# Patient Record
Sex: Female | Born: 1940
Health system: Southern US, Community
[De-identification: ages and names within clinical notes are randomized; demographics above are authoritative.]

## PROBLEM LIST (undated history)

## (undated) DIAGNOSIS — Z87442 Personal history of urinary calculi: Secondary | ICD-10-CM

## (undated) DIAGNOSIS — E785 Hyperlipidemia, unspecified: Secondary | ICD-10-CM

## (undated) DIAGNOSIS — Z9889 Other specified postprocedural states: Secondary | ICD-10-CM

## (undated) DIAGNOSIS — R011 Cardiac murmur, unspecified: Secondary | ICD-10-CM

## (undated) DIAGNOSIS — F0390 Unspecified dementia without behavioral disturbance: Secondary | ICD-10-CM

## (undated) DIAGNOSIS — I10 Essential (primary) hypertension: Secondary | ICD-10-CM

## (undated) DIAGNOSIS — K760 Fatty (change of) liver, not elsewhere classified: Secondary | ICD-10-CM

## (undated) DIAGNOSIS — E079 Disorder of thyroid, unspecified: Secondary | ICD-10-CM

## (undated) DIAGNOSIS — I499 Cardiac arrhythmia, unspecified: Secondary | ICD-10-CM

## (undated) HISTORY — DX: Essential (primary) hypertension: I10

## (undated) HISTORY — PX: TOTAL KNEE ARTHROPLASTY: SHX125

## (undated) HISTORY — DX: Cardiac arrhythmia, unspecified: I49.9

## (undated) HISTORY — DX: Cardiac murmur, unspecified: R01.1

## (undated) HISTORY — DX: Disorder of thyroid, unspecified: E07.9

## (undated) HISTORY — PX: TOTAL ABDOMINAL HYSTERECTOMY: SHX209

## (undated) HISTORY — PX: BACK SURGERY: SHX140

## (undated) HISTORY — DX: Other specified postprocedural states: Z98.890

## (undated) HISTORY — DX: Personal history of urinary calculi: Z87.442

## (undated) HISTORY — DX: Hyperlipidemia, unspecified: E78.5

## (undated) HISTORY — PX: TOE SURGERY: SHX1073

## (undated) HISTORY — PX: OTHER SURGICAL HISTORY: SHX169

## (undated) HISTORY — PX: CHOLECYSTECTOMY: SHX55

---

## 1998-01-17 ENCOUNTER — Ambulatory Visit (HOSPITAL_COMMUNITY): Admission: RE | Admit: 1998-01-17 | Discharge: 1998-01-17 | Payer: Self-pay | Admitting: Neurosurgery

## 2004-10-11 ENCOUNTER — Ambulatory Visit: Payer: Self-pay | Admitting: Specialist

## 2004-10-17 ENCOUNTER — Ambulatory Visit: Payer: Self-pay | Admitting: Specialist

## 2005-01-15 ENCOUNTER — Encounter: Payer: Self-pay | Admitting: Orthopaedic Surgery

## 2005-01-18 ENCOUNTER — Observation Stay: Payer: Self-pay | Admitting: Internal Medicine

## 2005-01-20 ENCOUNTER — Encounter: Payer: Self-pay | Admitting: Orthopaedic Surgery

## 2005-02-20 ENCOUNTER — Encounter: Payer: Self-pay | Admitting: Orthopaedic Surgery

## 2005-11-22 ENCOUNTER — Ambulatory Visit: Payer: Self-pay | Admitting: Internal Medicine

## 2005-12-07 ENCOUNTER — Ambulatory Visit: Payer: Self-pay | Admitting: Internal Medicine

## 2006-02-16 ENCOUNTER — Other Ambulatory Visit: Payer: Self-pay

## 2006-02-16 ENCOUNTER — Emergency Department: Payer: Self-pay | Admitting: Emergency Medicine

## 2006-03-14 ENCOUNTER — Ambulatory Visit: Payer: Self-pay | Admitting: Gastroenterology

## 2007-05-20 ENCOUNTER — Ambulatory Visit: Payer: Self-pay | Admitting: Unknown Physician Specialty

## 2008-05-21 ENCOUNTER — Ambulatory Visit: Payer: Self-pay | Admitting: Internal Medicine

## 2009-01-31 DIAGNOSIS — C4491 Basal cell carcinoma of skin, unspecified: Secondary | ICD-10-CM

## 2009-01-31 HISTORY — DX: Basal cell carcinoma of skin, unspecified: C44.91

## 2009-06-28 ENCOUNTER — Ambulatory Visit: Payer: Self-pay | Admitting: Internal Medicine

## 2009-07-27 ENCOUNTER — Ambulatory Visit: Payer: Self-pay | Admitting: Unknown Physician Specialty

## 2009-07-29 ENCOUNTER — Ambulatory Visit: Payer: Self-pay | Admitting: Unknown Physician Specialty

## 2009-08-12 ENCOUNTER — Encounter: Payer: Self-pay | Admitting: Orthopedic Surgery

## 2009-08-23 ENCOUNTER — Encounter: Payer: Self-pay | Admitting: Orthopedic Surgery

## 2009-09-01 ENCOUNTER — Ambulatory Visit: Payer: Self-pay | Admitting: Neurology

## 2009-09-20 ENCOUNTER — Encounter: Payer: Self-pay | Admitting: Orthopedic Surgery

## 2009-09-21 ENCOUNTER — Ambulatory Visit: Payer: Self-pay | Admitting: Ophthalmology

## 2009-11-23 ENCOUNTER — Ambulatory Visit: Payer: Self-pay | Admitting: Ophthalmology

## 2009-12-12 ENCOUNTER — Ambulatory Visit: Payer: Self-pay | Admitting: Internal Medicine

## 2010-04-24 ENCOUNTER — Ambulatory Visit: Payer: Self-pay | Admitting: Internal Medicine

## 2010-09-25 ENCOUNTER — Ambulatory Visit: Payer: Self-pay | Admitting: Unknown Physician Specialty

## 2011-01-08 ENCOUNTER — Ambulatory Visit: Payer: Self-pay

## 2011-02-09 ENCOUNTER — Ambulatory Visit: Payer: Self-pay | Admitting: General Practice

## 2011-02-15 ENCOUNTER — Ambulatory Visit: Payer: Self-pay | Admitting: Internal Medicine

## 2011-02-28 ENCOUNTER — Ambulatory Visit: Payer: Self-pay | Admitting: General Practice

## 2011-08-08 DIAGNOSIS — M1710 Unilateral primary osteoarthritis, unspecified knee: Secondary | ICD-10-CM | POA: Diagnosis not present

## 2011-08-08 DIAGNOSIS — M25569 Pain in unspecified knee: Secondary | ICD-10-CM | POA: Diagnosis not present

## 2011-08-16 DIAGNOSIS — B029 Zoster without complications: Secondary | ICD-10-CM | POA: Diagnosis not present

## 2011-08-23 DIAGNOSIS — J209 Acute bronchitis, unspecified: Secondary | ICD-10-CM | POA: Diagnosis not present

## 2011-08-23 DIAGNOSIS — B029 Zoster without complications: Secondary | ICD-10-CM | POA: Diagnosis not present

## 2011-08-27 ENCOUNTER — Ambulatory Visit: Payer: Self-pay | Admitting: Internal Medicine

## 2011-08-27 DIAGNOSIS — R0602 Shortness of breath: Secondary | ICD-10-CM | POA: Diagnosis not present

## 2011-08-27 DIAGNOSIS — R062 Wheezing: Secondary | ICD-10-CM | POA: Diagnosis not present

## 2011-08-29 ENCOUNTER — Ambulatory Visit: Payer: Self-pay | Admitting: Internal Medicine

## 2011-08-29 DIAGNOSIS — R0602 Shortness of breath: Secondary | ICD-10-CM | POA: Diagnosis not present

## 2011-08-29 DIAGNOSIS — J40 Bronchitis, not specified as acute or chronic: Secondary | ICD-10-CM | POA: Diagnosis not present

## 2011-08-29 DIAGNOSIS — R062 Wheezing: Secondary | ICD-10-CM | POA: Diagnosis not present

## 2011-08-29 LAB — CBC WITH DIFFERENTIAL/PLATELET
Basophil #: 0.1 10*3/uL (ref 0.0–0.1)
Basophil %: 1 %
Eosinophil #: 0.2 10*3/uL (ref 0.0–0.7)
Eosinophil %: 3.1 %
HCT: 38.2 % (ref 35.0–47.0)
HGB: 12.7 g/dL (ref 12.0–16.0)
Lymphocyte #: 1.9 10*3/uL (ref 1.0–3.6)
Lymphocyte %: 32.7 %
MCH: 30.9 pg (ref 26.0–34.0)
MCHC: 33.3 g/dL (ref 32.0–36.0)
MCV: 93 fL (ref 80–100)
Monocyte #: 0.2 10*3/uL (ref 0.0–0.7)
Monocyte %: 3.4 %
Neutrophil #: 3.4 10*3/uL (ref 1.4–6.5)
Neutrophil %: 59.8 %
Platelet: 179 10*3/uL (ref 150–440)
RBC: 4.12 10*6/uL (ref 3.80–5.20)
RDW: 13.9 % (ref 11.5–14.5)
WBC: 5.7 10*3/uL (ref 3.6–11.0)

## 2011-08-29 LAB — COMPREHENSIVE METABOLIC PANEL
Albumin: 4.1 g/dL (ref 3.4–5.0)
Alkaline Phosphatase: 60 U/L (ref 50–136)
Anion Gap: 7 (ref 7–16)
BUN: 15 mg/dL (ref 7–18)
Bilirubin,Total: 0.9 mg/dL (ref 0.2–1.0)
Calcium, Total: 9.3 mg/dL (ref 8.5–10.1)
Chloride: 106 mmol/L (ref 98–107)
Co2: 26 mmol/L (ref 21–32)
Creatinine: 1.14 mg/dL (ref 0.60–1.30)
EGFR (African American): >60
EGFR (Non-African Amer.): 50 — ABNORMAL LOW
Glucose: 108 mg/dL — ABNORMAL HIGH (ref 65–99)
Osmolality: 279 (ref 275–301)
Potassium: 4.8 mmol/L (ref 3.5–5.1)
SGOT(AST): 170 U/L — ABNORMAL HIGH (ref 15–37)
SGPT (ALT): 122 U/L — ABNORMAL HIGH
Sodium: 139 mmol/L (ref 136–145)
Total Protein: 8.1 g/dL (ref 6.4–8.2)

## 2011-09-06 DIAGNOSIS — M1710 Unilateral primary osteoarthritis, unspecified knee: Secondary | ICD-10-CM | POA: Diagnosis not present

## 2011-09-06 DIAGNOSIS — Z01810 Encounter for preprocedural cardiovascular examination: Secondary | ICD-10-CM | POA: Diagnosis not present

## 2011-09-06 DIAGNOSIS — Z01818 Encounter for other preprocedural examination: Secondary | ICD-10-CM | POA: Diagnosis not present

## 2011-09-17 DIAGNOSIS — K219 Gastro-esophageal reflux disease without esophagitis: Secondary | ICD-10-CM | POA: Diagnosis not present

## 2011-09-17 DIAGNOSIS — Z7982 Long term (current) use of aspirin: Secondary | ICD-10-CM | POA: Diagnosis not present

## 2011-09-17 DIAGNOSIS — E039 Hypothyroidism, unspecified: Secondary | ICD-10-CM | POA: Diagnosis not present

## 2011-09-17 DIAGNOSIS — M1710 Unilateral primary osteoarthritis, unspecified knee: Secondary | ICD-10-CM | POA: Diagnosis not present

## 2011-09-17 DIAGNOSIS — I10 Essential (primary) hypertension: Secondary | ICD-10-CM | POA: Diagnosis not present

## 2011-09-17 DIAGNOSIS — Z79899 Other long term (current) drug therapy: Secondary | ICD-10-CM | POA: Diagnosis not present

## 2011-09-18 DIAGNOSIS — I10 Essential (primary) hypertension: Secondary | ICD-10-CM | POA: Diagnosis not present

## 2011-09-18 DIAGNOSIS — E039 Hypothyroidism, unspecified: Secondary | ICD-10-CM | POA: Diagnosis not present

## 2011-09-18 DIAGNOSIS — Z79899 Other long term (current) drug therapy: Secondary | ICD-10-CM | POA: Diagnosis not present

## 2011-09-18 DIAGNOSIS — M1710 Unilateral primary osteoarthritis, unspecified knee: Secondary | ICD-10-CM | POA: Diagnosis not present

## 2011-09-18 DIAGNOSIS — Z7982 Long term (current) use of aspirin: Secondary | ICD-10-CM | POA: Diagnosis not present

## 2011-09-18 DIAGNOSIS — K219 Gastro-esophageal reflux disease without esophagitis: Secondary | ICD-10-CM | POA: Diagnosis not present

## 2011-09-19 DIAGNOSIS — Z7982 Long term (current) use of aspirin: Secondary | ICD-10-CM | POA: Diagnosis not present

## 2011-09-19 DIAGNOSIS — Z79899 Other long term (current) drug therapy: Secondary | ICD-10-CM | POA: Diagnosis not present

## 2011-09-19 DIAGNOSIS — I10 Essential (primary) hypertension: Secondary | ICD-10-CM | POA: Diagnosis not present

## 2011-09-19 DIAGNOSIS — E039 Hypothyroidism, unspecified: Secondary | ICD-10-CM | POA: Diagnosis not present

## 2011-09-19 DIAGNOSIS — M1710 Unilateral primary osteoarthritis, unspecified knee: Secondary | ICD-10-CM | POA: Diagnosis not present

## 2011-09-19 DIAGNOSIS — K219 Gastro-esophageal reflux disease without esophagitis: Secondary | ICD-10-CM | POA: Diagnosis not present

## 2011-09-25 ENCOUNTER — Encounter: Payer: Self-pay | Admitting: Orthopedic Surgery

## 2011-09-25 DIAGNOSIS — M6281 Muscle weakness (generalized): Secondary | ICD-10-CM | POA: Diagnosis not present

## 2011-09-25 DIAGNOSIS — R262 Difficulty in walking, not elsewhere classified: Secondary | ICD-10-CM | POA: Diagnosis not present

## 2011-09-25 DIAGNOSIS — Z96659 Presence of unspecified artificial knee joint: Secondary | ICD-10-CM | POA: Diagnosis not present

## 2011-09-25 DIAGNOSIS — IMO0001 Reserved for inherently not codable concepts without codable children: Secondary | ICD-10-CM | POA: Diagnosis not present

## 2011-10-03 DIAGNOSIS — I10 Essential (primary) hypertension: Secondary | ICD-10-CM | POA: Diagnosis not present

## 2011-10-03 DIAGNOSIS — L02419 Cutaneous abscess of limb, unspecified: Secondary | ICD-10-CM | POA: Diagnosis not present

## 2011-10-03 DIAGNOSIS — E039 Hypothyroidism, unspecified: Secondary | ICD-10-CM | POA: Diagnosis not present

## 2011-10-03 DIAGNOSIS — T8450XA Infection and inflammatory reaction due to unspecified internal joint prosthesis, initial encounter: Secondary | ICD-10-CM | POA: Diagnosis not present

## 2011-10-03 DIAGNOSIS — M549 Dorsalgia, unspecified: Secondary | ICD-10-CM | POA: Diagnosis not present

## 2011-10-03 DIAGNOSIS — G8929 Other chronic pain: Secondary | ICD-10-CM | POA: Diagnosis not present

## 2011-10-03 DIAGNOSIS — F329 Major depressive disorder, single episode, unspecified: Secondary | ICD-10-CM | POA: Diagnosis not present

## 2011-10-03 DIAGNOSIS — L03119 Cellulitis of unspecified part of limb: Secondary | ICD-10-CM | POA: Diagnosis not present

## 2011-10-03 DIAGNOSIS — Z96659 Presence of unspecified artificial knee joint: Secondary | ICD-10-CM | POA: Diagnosis not present

## 2011-10-03 DIAGNOSIS — E785 Hyperlipidemia, unspecified: Secondary | ICD-10-CM | POA: Diagnosis not present

## 2011-10-03 DIAGNOSIS — M009 Pyogenic arthritis, unspecified: Secondary | ICD-10-CM | POA: Diagnosis not present

## 2011-10-04 DIAGNOSIS — K219 Gastro-esophageal reflux disease without esophagitis: Secondary | ICD-10-CM | POA: Diagnosis present

## 2011-10-04 DIAGNOSIS — J984 Other disorders of lung: Secondary | ICD-10-CM | POA: Diagnosis present

## 2011-10-04 DIAGNOSIS — E039 Hypothyroidism, unspecified: Secondary | ICD-10-CM | POA: Diagnosis present

## 2011-10-04 DIAGNOSIS — I10 Essential (primary) hypertension: Secondary | ICD-10-CM | POA: Diagnosis present

## 2011-10-04 DIAGNOSIS — Z792 Long term (current) use of antibiotics: Secondary | ICD-10-CM | POA: Diagnosis not present

## 2011-10-04 DIAGNOSIS — Y831 Surgical operation with implant of artificial internal device as the cause of abnormal reaction of the patient, or of later complication, without mention of misadventure at the time of the procedure: Secondary | ICD-10-CM | POA: Diagnosis not present

## 2011-10-04 DIAGNOSIS — Z452 Encounter for adjustment and management of vascular access device: Secondary | ICD-10-CM | POA: Diagnosis not present

## 2011-10-04 DIAGNOSIS — E785 Hyperlipidemia, unspecified: Secondary | ICD-10-CM | POA: Diagnosis present

## 2011-10-04 DIAGNOSIS — Z6836 Body mass index (BMI) 36.0-36.9, adult: Secondary | ICD-10-CM | POA: Diagnosis not present

## 2011-10-04 DIAGNOSIS — R339 Retention of urine, unspecified: Secondary | ICD-10-CM | POA: Diagnosis not present

## 2011-10-04 DIAGNOSIS — T8450XA Infection and inflammatory reaction due to unspecified internal joint prosthesis, initial encounter: Secondary | ICD-10-CM | POA: Diagnosis not present

## 2011-10-04 DIAGNOSIS — E669 Obesity, unspecified: Secondary | ICD-10-CM | POA: Diagnosis present

## 2011-10-04 DIAGNOSIS — K7689 Other specified diseases of liver: Secondary | ICD-10-CM | POA: Diagnosis present

## 2011-10-04 DIAGNOSIS — B954 Other streptococcus as the cause of diseases classified elsewhere: Secondary | ICD-10-CM | POA: Diagnosis present

## 2011-10-04 DIAGNOSIS — Z96659 Presence of unspecified artificial knee joint: Secondary | ICD-10-CM | POA: Diagnosis not present

## 2011-10-09 DIAGNOSIS — Z96659 Presence of unspecified artificial knee joint: Secondary | ICD-10-CM | POA: Diagnosis not present

## 2011-10-09 DIAGNOSIS — I10 Essential (primary) hypertension: Secondary | ICD-10-CM | POA: Diagnosis not present

## 2011-10-09 DIAGNOSIS — E039 Hypothyroidism, unspecified: Secondary | ICD-10-CM | POA: Diagnosis not present

## 2011-10-09 DIAGNOSIS — Z452 Encounter for adjustment and management of vascular access device: Secondary | ICD-10-CM | POA: Diagnosis not present

## 2011-10-09 DIAGNOSIS — T8450XA Infection and inflammatory reaction due to unspecified internal joint prosthesis, initial encounter: Secondary | ICD-10-CM | POA: Diagnosis not present

## 2011-10-09 DIAGNOSIS — Z792 Long term (current) use of antibiotics: Secondary | ICD-10-CM | POA: Diagnosis not present

## 2011-10-12 DIAGNOSIS — M1710 Unilateral primary osteoarthritis, unspecified knee: Secondary | ICD-10-CM | POA: Diagnosis not present

## 2011-10-12 DIAGNOSIS — I10 Essential (primary) hypertension: Secondary | ICD-10-CM | POA: Diagnosis not present

## 2011-10-12 DIAGNOSIS — E039 Hypothyroidism, unspecified: Secondary | ICD-10-CM | POA: Diagnosis not present

## 2011-10-12 DIAGNOSIS — Z792 Long term (current) use of antibiotics: Secondary | ICD-10-CM | POA: Diagnosis not present

## 2011-10-12 DIAGNOSIS — Z452 Encounter for adjustment and management of vascular access device: Secondary | ICD-10-CM | POA: Diagnosis not present

## 2011-10-12 DIAGNOSIS — Z96659 Presence of unspecified artificial knee joint: Secondary | ICD-10-CM | POA: Diagnosis not present

## 2011-10-12 DIAGNOSIS — T8450XA Infection and inflammatory reaction due to unspecified internal joint prosthesis, initial encounter: Secondary | ICD-10-CM | POA: Diagnosis not present

## 2011-10-16 DIAGNOSIS — T8450XA Infection and inflammatory reaction due to unspecified internal joint prosthesis, initial encounter: Secondary | ICD-10-CM | POA: Diagnosis not present

## 2011-10-16 DIAGNOSIS — Z5189 Encounter for other specified aftercare: Secondary | ICD-10-CM | POA: Diagnosis not present

## 2011-10-16 DIAGNOSIS — I10 Essential (primary) hypertension: Secondary | ICD-10-CM | POA: Diagnosis not present

## 2011-10-16 DIAGNOSIS — IMO0002 Reserved for concepts with insufficient information to code with codable children: Secondary | ICD-10-CM | POA: Diagnosis not present

## 2011-10-16 DIAGNOSIS — Z96659 Presence of unspecified artificial knee joint: Secondary | ICD-10-CM | POA: Diagnosis not present

## 2011-10-16 DIAGNOSIS — E039 Hypothyroidism, unspecified: Secondary | ICD-10-CM | POA: Diagnosis not present

## 2011-10-16 DIAGNOSIS — Z452 Encounter for adjustment and management of vascular access device: Secondary | ICD-10-CM | POA: Diagnosis not present

## 2011-10-16 DIAGNOSIS — Z792 Long term (current) use of antibiotics: Secondary | ICD-10-CM | POA: Diagnosis not present

## 2011-10-17 DIAGNOSIS — Z452 Encounter for adjustment and management of vascular access device: Secondary | ICD-10-CM | POA: Diagnosis not present

## 2011-10-17 DIAGNOSIS — E039 Hypothyroidism, unspecified: Secondary | ICD-10-CM | POA: Diagnosis not present

## 2011-10-17 DIAGNOSIS — Z96659 Presence of unspecified artificial knee joint: Secondary | ICD-10-CM | POA: Diagnosis not present

## 2011-10-17 DIAGNOSIS — T8450XA Infection and inflammatory reaction due to unspecified internal joint prosthesis, initial encounter: Secondary | ICD-10-CM | POA: Diagnosis not present

## 2011-10-17 DIAGNOSIS — I10 Essential (primary) hypertension: Secondary | ICD-10-CM | POA: Diagnosis not present

## 2011-10-17 DIAGNOSIS — Z792 Long term (current) use of antibiotics: Secondary | ICD-10-CM | POA: Diagnosis not present

## 2011-10-22 ENCOUNTER — Encounter: Payer: Self-pay | Admitting: Orthopedic Surgery

## 2011-10-23 DIAGNOSIS — Z96659 Presence of unspecified artificial knee joint: Secondary | ICD-10-CM | POA: Diagnosis not present

## 2011-10-23 DIAGNOSIS — T8450XA Infection and inflammatory reaction due to unspecified internal joint prosthesis, initial encounter: Secondary | ICD-10-CM | POA: Diagnosis not present

## 2011-10-23 DIAGNOSIS — E039 Hypothyroidism, unspecified: Secondary | ICD-10-CM | POA: Diagnosis not present

## 2011-10-23 DIAGNOSIS — Z452 Encounter for adjustment and management of vascular access device: Secondary | ICD-10-CM | POA: Diagnosis not present

## 2011-10-23 DIAGNOSIS — Z792 Long term (current) use of antibiotics: Secondary | ICD-10-CM | POA: Diagnosis not present

## 2011-10-23 DIAGNOSIS — I10 Essential (primary) hypertension: Secondary | ICD-10-CM | POA: Diagnosis not present

## 2011-10-24 DIAGNOSIS — B370 Candidal stomatitis: Secondary | ICD-10-CM | POA: Diagnosis not present

## 2011-10-24 DIAGNOSIS — B354 Tinea corporis: Secondary | ICD-10-CM | POA: Diagnosis not present

## 2011-10-26 DIAGNOSIS — Z792 Long term (current) use of antibiotics: Secondary | ICD-10-CM | POA: Diagnosis not present

## 2011-10-26 DIAGNOSIS — Z96659 Presence of unspecified artificial knee joint: Secondary | ICD-10-CM | POA: Diagnosis not present

## 2011-10-26 DIAGNOSIS — Z452 Encounter for adjustment and management of vascular access device: Secondary | ICD-10-CM | POA: Diagnosis not present

## 2011-10-26 DIAGNOSIS — T8450XA Infection and inflammatory reaction due to unspecified internal joint prosthesis, initial encounter: Secondary | ICD-10-CM | POA: Diagnosis not present

## 2011-10-26 DIAGNOSIS — I10 Essential (primary) hypertension: Secondary | ICD-10-CM | POA: Diagnosis not present

## 2011-10-26 DIAGNOSIS — E039 Hypothyroidism, unspecified: Secondary | ICD-10-CM | POA: Diagnosis not present

## 2011-10-29 DIAGNOSIS — A419 Sepsis, unspecified organism: Secondary | ICD-10-CM | POA: Diagnosis not present

## 2011-10-29 DIAGNOSIS — N179 Acute kidney failure, unspecified: Secondary | ICD-10-CM | POA: Diagnosis not present

## 2011-10-29 DIAGNOSIS — K219 Gastro-esophageal reflux disease without esophagitis: Secondary | ICD-10-CM | POA: Diagnosis present

## 2011-10-29 DIAGNOSIS — D696 Thrombocytopenia, unspecified: Secondary | ICD-10-CM | POA: Diagnosis not present

## 2011-10-29 DIAGNOSIS — I10 Essential (primary) hypertension: Secondary | ICD-10-CM | POA: Diagnosis present

## 2011-10-29 DIAGNOSIS — E669 Obesity, unspecified: Secondary | ICD-10-CM | POA: Diagnosis present

## 2011-10-29 DIAGNOSIS — I059 Rheumatic mitral valve disease, unspecified: Secondary | ICD-10-CM | POA: Diagnosis present

## 2011-10-29 DIAGNOSIS — G4730 Sleep apnea, unspecified: Secondary | ICD-10-CM | POA: Diagnosis present

## 2011-10-29 DIAGNOSIS — N008 Acute nephritic syndrome with other morphologic changes: Secondary | ICD-10-CM | POA: Diagnosis present

## 2011-10-29 DIAGNOSIS — R509 Fever, unspecified: Secondary | ICD-10-CM | POA: Diagnosis not present

## 2011-10-29 DIAGNOSIS — Z452 Encounter for adjustment and management of vascular access device: Secondary | ICD-10-CM | POA: Diagnosis not present

## 2011-10-29 DIAGNOSIS — E039 Hypothyroidism, unspecified: Secondary | ICD-10-CM | POA: Diagnosis present

## 2011-10-29 DIAGNOSIS — K7689 Other specified diseases of liver: Secondary | ICD-10-CM | POA: Diagnosis present

## 2011-10-29 DIAGNOSIS — R6510 Systemic inflammatory response syndrome (SIRS) of non-infectious origin without acute organ dysfunction: Secondary | ICD-10-CM | POA: Diagnosis not present

## 2011-10-29 DIAGNOSIS — R197 Diarrhea, unspecified: Secondary | ICD-10-CM | POA: Diagnosis not present

## 2011-10-29 DIAGNOSIS — T8450XA Infection and inflammatory reaction due to unspecified internal joint prosthesis, initial encounter: Secondary | ICD-10-CM | POA: Diagnosis not present

## 2011-10-29 DIAGNOSIS — D61818 Other pancytopenia: Secondary | ICD-10-CM | POA: Diagnosis present

## 2011-10-29 DIAGNOSIS — A088 Other specified intestinal infections: Secondary | ICD-10-CM | POA: Diagnosis present

## 2011-10-29 DIAGNOSIS — N170 Acute kidney failure with tubular necrosis: Secondary | ICD-10-CM | POA: Diagnosis not present

## 2011-10-29 DIAGNOSIS — Z9889 Other specified postprocedural states: Secondary | ICD-10-CM | POA: Diagnosis not present

## 2011-10-29 DIAGNOSIS — R110 Nausea: Secondary | ICD-10-CM | POA: Diagnosis not present

## 2011-10-29 DIAGNOSIS — Z96659 Presence of unspecified artificial knee joint: Secondary | ICD-10-CM | POA: Diagnosis not present

## 2011-10-29 DIAGNOSIS — R5381 Other malaise: Secondary | ICD-10-CM | POA: Diagnosis not present

## 2011-10-29 DIAGNOSIS — E785 Hyperlipidemia, unspecified: Secondary | ICD-10-CM | POA: Diagnosis present

## 2011-11-06 DIAGNOSIS — N19 Unspecified kidney failure: Secondary | ICD-10-CM | POA: Diagnosis not present

## 2011-11-06 DIAGNOSIS — T783XXA Angioneurotic edema, initial encounter: Secondary | ICD-10-CM | POA: Diagnosis not present

## 2011-11-22 DIAGNOSIS — Z5189 Encounter for other specified aftercare: Secondary | ICD-10-CM | POA: Diagnosis not present

## 2011-11-22 DIAGNOSIS — M25569 Pain in unspecified knee: Secondary | ICD-10-CM | POA: Diagnosis not present

## 2011-11-22 DIAGNOSIS — M1710 Unilateral primary osteoarthritis, unspecified knee: Secondary | ICD-10-CM | POA: Diagnosis not present

## 2011-12-14 DIAGNOSIS — Z5189 Encounter for other specified aftercare: Secondary | ICD-10-CM | POA: Diagnosis not present

## 2011-12-14 DIAGNOSIS — M25569 Pain in unspecified knee: Secondary | ICD-10-CM | POA: Diagnosis not present

## 2011-12-19 DIAGNOSIS — M25569 Pain in unspecified knee: Secondary | ICD-10-CM | POA: Diagnosis not present

## 2012-01-16 DIAGNOSIS — D62 Acute posthemorrhagic anemia: Secondary | ICD-10-CM | POA: Diagnosis not present

## 2012-01-16 DIAGNOSIS — I10 Essential (primary) hypertension: Secondary | ICD-10-CM | POA: Diagnosis not present

## 2012-01-16 DIAGNOSIS — K603 Anal fistula, unspecified: Secondary | ICD-10-CM | POA: Diagnosis not present

## 2012-01-17 DIAGNOSIS — I10 Essential (primary) hypertension: Secondary | ICD-10-CM | POA: Diagnosis not present

## 2012-01-17 DIAGNOSIS — D649 Anemia, unspecified: Secondary | ICD-10-CM | POA: Diagnosis not present

## 2012-01-17 DIAGNOSIS — E039 Hypothyroidism, unspecified: Secondary | ICD-10-CM | POA: Diagnosis not present

## 2012-01-17 DIAGNOSIS — E559 Vitamin D deficiency, unspecified: Secondary | ICD-10-CM | POA: Diagnosis not present

## 2012-01-17 DIAGNOSIS — E782 Mixed hyperlipidemia: Secondary | ICD-10-CM | POA: Diagnosis not present

## 2012-01-28 DIAGNOSIS — Z1231 Encounter for screening mammogram for malignant neoplasm of breast: Secondary | ICD-10-CM | POA: Diagnosis not present

## 2012-01-30 DIAGNOSIS — K7460 Unspecified cirrhosis of liver: Secondary | ICD-10-CM | POA: Diagnosis not present

## 2012-02-21 DIAGNOSIS — K602 Anal fissure, unspecified: Secondary | ICD-10-CM | POA: Diagnosis not present

## 2012-03-04 DIAGNOSIS — R0602 Shortness of breath: Secondary | ICD-10-CM | POA: Diagnosis not present

## 2012-03-04 DIAGNOSIS — I10 Essential (primary) hypertension: Secondary | ICD-10-CM | POA: Diagnosis not present

## 2012-03-04 DIAGNOSIS — F411 Generalized anxiety disorder: Secondary | ICD-10-CM | POA: Diagnosis not present

## 2012-04-02 DIAGNOSIS — K602 Anal fissure, unspecified: Secondary | ICD-10-CM | POA: Diagnosis not present

## 2012-04-21 DIAGNOSIS — Z961 Presence of intraocular lens: Secondary | ICD-10-CM | POA: Diagnosis not present

## 2012-04-21 DIAGNOSIS — H2510 Age-related nuclear cataract, unspecified eye: Secondary | ICD-10-CM | POA: Diagnosis not present

## 2012-04-22 DIAGNOSIS — I10 Essential (primary) hypertension: Secondary | ICD-10-CM | POA: Diagnosis not present

## 2012-04-22 DIAGNOSIS — E119 Type 2 diabetes mellitus without complications: Secondary | ICD-10-CM | POA: Diagnosis not present

## 2012-04-22 DIAGNOSIS — E039 Hypothyroidism, unspecified: Secondary | ICD-10-CM | POA: Diagnosis not present

## 2012-04-25 DIAGNOSIS — K7460 Unspecified cirrhosis of liver: Secondary | ICD-10-CM | POA: Diagnosis not present

## 2012-04-25 DIAGNOSIS — Z79899 Other long term (current) drug therapy: Secondary | ICD-10-CM | POA: Diagnosis not present

## 2012-06-05 DIAGNOSIS — M1710 Unilateral primary osteoarthritis, unspecified knee: Secondary | ICD-10-CM | POA: Diagnosis not present

## 2012-06-05 DIAGNOSIS — M25569 Pain in unspecified knee: Secondary | ICD-10-CM | POA: Diagnosis not present

## 2012-07-25 DIAGNOSIS — I10 Essential (primary) hypertension: Secondary | ICD-10-CM | POA: Diagnosis not present

## 2012-07-25 DIAGNOSIS — Z23 Encounter for immunization: Secondary | ICD-10-CM | POA: Diagnosis not present

## 2012-08-15 DIAGNOSIS — J3489 Other specified disorders of nose and nasal sinuses: Secondary | ICD-10-CM | POA: Diagnosis not present

## 2012-08-15 DIAGNOSIS — M542 Cervicalgia: Secondary | ICD-10-CM | POA: Diagnosis not present

## 2012-08-15 DIAGNOSIS — R0982 Postnasal drip: Secondary | ICD-10-CM | POA: Diagnosis not present

## 2012-08-26 DIAGNOSIS — M25579 Pain in unspecified ankle and joints of unspecified foot: Secondary | ICD-10-CM | POA: Diagnosis not present

## 2012-08-26 DIAGNOSIS — M2020 Hallux rigidus, unspecified foot: Secondary | ICD-10-CM | POA: Diagnosis not present

## 2012-10-20 DIAGNOSIS — R7309 Other abnormal glucose: Secondary | ICD-10-CM | POA: Diagnosis not present

## 2012-10-20 DIAGNOSIS — I10 Essential (primary) hypertension: Secondary | ICD-10-CM | POA: Diagnosis not present

## 2012-10-20 DIAGNOSIS — E039 Hypothyroidism, unspecified: Secondary | ICD-10-CM | POA: Diagnosis not present

## 2012-10-20 DIAGNOSIS — C049 Malignant neoplasm of floor of mouth, unspecified: Secondary | ICD-10-CM | POA: Diagnosis not present

## 2012-10-22 DIAGNOSIS — G609 Hereditary and idiopathic neuropathy, unspecified: Secondary | ICD-10-CM | POA: Diagnosis not present

## 2012-10-22 DIAGNOSIS — K7689 Other specified diseases of liver: Secondary | ICD-10-CM | POA: Diagnosis not present

## 2012-10-22 DIAGNOSIS — K7460 Unspecified cirrhosis of liver: Secondary | ICD-10-CM | POA: Diagnosis not present

## 2012-10-22 DIAGNOSIS — G629 Polyneuropathy, unspecified: Secondary | ICD-10-CM | POA: Insufficient documentation

## 2012-10-27 DIAGNOSIS — G609 Hereditary and idiopathic neuropathy, unspecified: Secondary | ICD-10-CM | POA: Diagnosis not present

## 2012-10-27 DIAGNOSIS — Z0389 Encounter for observation for other suspected diseases and conditions ruled out: Secondary | ICD-10-CM | POA: Diagnosis not present

## 2012-10-27 DIAGNOSIS — K7689 Other specified diseases of liver: Secondary | ICD-10-CM | POA: Diagnosis not present

## 2012-10-27 DIAGNOSIS — K838 Other specified diseases of biliary tract: Secondary | ICD-10-CM | POA: Diagnosis not present

## 2012-10-27 DIAGNOSIS — K7460 Unspecified cirrhosis of liver: Secondary | ICD-10-CM | POA: Diagnosis not present

## 2012-11-26 DIAGNOSIS — I10 Essential (primary) hypertension: Secondary | ICD-10-CM | POA: Diagnosis not present

## 2013-01-09 DIAGNOSIS — I10 Essential (primary) hypertension: Secondary | ICD-10-CM | POA: Diagnosis not present

## 2013-01-09 DIAGNOSIS — F411 Generalized anxiety disorder: Secondary | ICD-10-CM | POA: Diagnosis not present

## 2013-02-09 DIAGNOSIS — Z85828 Personal history of other malignant neoplasm of skin: Secondary | ICD-10-CM | POA: Diagnosis not present

## 2013-02-09 DIAGNOSIS — L570 Actinic keratosis: Secondary | ICD-10-CM | POA: Diagnosis not present

## 2013-02-09 DIAGNOSIS — L820 Inflamed seborrheic keratosis: Secondary | ICD-10-CM | POA: Diagnosis not present

## 2013-02-09 DIAGNOSIS — L909 Atrophic disorder of skin, unspecified: Secondary | ICD-10-CM | POA: Diagnosis not present

## 2013-02-09 DIAGNOSIS — L821 Other seborrheic keratosis: Secondary | ICD-10-CM | POA: Diagnosis not present

## 2013-02-09 DIAGNOSIS — L919 Hypertrophic disorder of the skin, unspecified: Secondary | ICD-10-CM | POA: Diagnosis not present

## 2013-02-09 DIAGNOSIS — D2330 Other benign neoplasm of skin of unspecified part of face: Secondary | ICD-10-CM | POA: Diagnosis not present

## 2013-02-09 DIAGNOSIS — D485 Neoplasm of uncertain behavior of skin: Secondary | ICD-10-CM | POA: Diagnosis not present

## 2013-02-16 DIAGNOSIS — Z1231 Encounter for screening mammogram for malignant neoplasm of breast: Secondary | ICD-10-CM | POA: Diagnosis not present

## 2013-04-13 DIAGNOSIS — F411 Generalized anxiety disorder: Secondary | ICD-10-CM | POA: Diagnosis not present

## 2013-04-13 DIAGNOSIS — Z23 Encounter for immunization: Secondary | ICD-10-CM | POA: Diagnosis not present

## 2013-04-13 DIAGNOSIS — I10 Essential (primary) hypertension: Secondary | ICD-10-CM | POA: Diagnosis not present

## 2013-04-28 DIAGNOSIS — IMO0002 Reserved for concepts with insufficient information to code with codable children: Secondary | ICD-10-CM | POA: Diagnosis not present

## 2013-04-28 DIAGNOSIS — Z96659 Presence of unspecified artificial knee joint: Secondary | ICD-10-CM | POA: Diagnosis not present

## 2013-04-28 DIAGNOSIS — M25569 Pain in unspecified knee: Secondary | ICD-10-CM | POA: Diagnosis not present

## 2013-04-28 DIAGNOSIS — M1710 Unilateral primary osteoarthritis, unspecified knee: Secondary | ICD-10-CM | POA: Diagnosis not present

## 2013-04-29 DIAGNOSIS — K7460 Unspecified cirrhosis of liver: Secondary | ICD-10-CM | POA: Diagnosis not present

## 2013-04-29 DIAGNOSIS — G609 Hereditary and idiopathic neuropathy, unspecified: Secondary | ICD-10-CM | POA: Diagnosis not present

## 2013-04-29 DIAGNOSIS — E669 Obesity, unspecified: Secondary | ICD-10-CM | POA: Diagnosis not present

## 2013-04-29 DIAGNOSIS — E785 Hyperlipidemia, unspecified: Secondary | ICD-10-CM | POA: Diagnosis not present

## 2013-05-11 DIAGNOSIS — K1120 Sialoadenitis, unspecified: Secondary | ICD-10-CM | POA: Diagnosis not present

## 2013-05-25 DIAGNOSIS — K1120 Sialoadenitis, unspecified: Secondary | ICD-10-CM | POA: Diagnosis not present

## 2013-06-01 DIAGNOSIS — K7460 Unspecified cirrhosis of liver: Secondary | ICD-10-CM | POA: Diagnosis not present

## 2013-06-01 DIAGNOSIS — E039 Hypothyroidism, unspecified: Secondary | ICD-10-CM | POA: Diagnosis not present

## 2013-06-01 DIAGNOSIS — IMO0002 Reserved for concepts with insufficient information to code with codable children: Secondary | ICD-10-CM | POA: Diagnosis not present

## 2013-06-01 DIAGNOSIS — K7689 Other specified diseases of liver: Secondary | ICD-10-CM | POA: Diagnosis not present

## 2013-06-01 DIAGNOSIS — Z8614 Personal history of Methicillin resistant Staphylococcus aureus infection: Secondary | ICD-10-CM | POA: Diagnosis not present

## 2013-06-01 DIAGNOSIS — Z01818 Encounter for other preprocedural examination: Secondary | ICD-10-CM | POA: Diagnosis not present

## 2013-06-01 DIAGNOSIS — G609 Hereditary and idiopathic neuropathy, unspecified: Secondary | ICD-10-CM | POA: Diagnosis not present

## 2013-06-01 DIAGNOSIS — I10 Essential (primary) hypertension: Secondary | ICD-10-CM | POA: Diagnosis not present

## 2013-06-01 DIAGNOSIS — I059 Rheumatic mitral valve disease, unspecified: Secondary | ICD-10-CM | POA: Diagnosis not present

## 2013-06-01 DIAGNOSIS — M25569 Pain in unspecified knee: Secondary | ICD-10-CM | POA: Diagnosis not present

## 2013-06-15 DIAGNOSIS — K7460 Unspecified cirrhosis of liver: Secondary | ICD-10-CM | POA: Diagnosis not present

## 2013-06-15 DIAGNOSIS — K648 Other hemorrhoids: Secondary | ICD-10-CM | POA: Diagnosis not present

## 2013-06-15 DIAGNOSIS — Z1389 Encounter for screening for other disorder: Secondary | ICD-10-CM | POA: Diagnosis not present

## 2013-06-15 DIAGNOSIS — I10 Essential (primary) hypertension: Secondary | ICD-10-CM | POA: Diagnosis not present

## 2013-06-15 DIAGNOSIS — Z79899 Other long term (current) drug therapy: Secondary | ICD-10-CM | POA: Diagnosis not present

## 2013-06-15 DIAGNOSIS — Z1211 Encounter for screening for malignant neoplasm of colon: Secondary | ICD-10-CM | POA: Diagnosis not present

## 2013-06-15 DIAGNOSIS — E039 Hypothyroidism, unspecified: Secondary | ICD-10-CM | POA: Diagnosis not present

## 2013-06-15 LAB — HM COLONOSCOPY: HM Colonoscopy: NORMAL

## 2013-07-24 DIAGNOSIS — J069 Acute upper respiratory infection, unspecified: Secondary | ICD-10-CM | POA: Diagnosis not present

## 2013-07-24 DIAGNOSIS — R05 Cough: Secondary | ICD-10-CM | POA: Diagnosis not present

## 2013-07-24 DIAGNOSIS — R059 Cough, unspecified: Secondary | ICD-10-CM | POA: Diagnosis not present

## 2013-08-11 DIAGNOSIS — L821 Other seborrheic keratosis: Secondary | ICD-10-CM | POA: Diagnosis not present

## 2013-08-11 DIAGNOSIS — L820 Inflamed seborrheic keratosis: Secondary | ICD-10-CM | POA: Diagnosis not present

## 2013-08-11 DIAGNOSIS — L578 Other skin changes due to chronic exposure to nonionizing radiation: Secondary | ICD-10-CM | POA: Diagnosis not present

## 2013-08-11 DIAGNOSIS — Z85828 Personal history of other malignant neoplasm of skin: Secondary | ICD-10-CM | POA: Diagnosis not present

## 2013-08-11 DIAGNOSIS — L570 Actinic keratosis: Secondary | ICD-10-CM | POA: Diagnosis not present

## 2013-10-13 DIAGNOSIS — I10 Essential (primary) hypertension: Secondary | ICD-10-CM | POA: Diagnosis not present

## 2013-10-13 DIAGNOSIS — R0609 Other forms of dyspnea: Secondary | ICD-10-CM | POA: Diagnosis not present

## 2013-10-13 DIAGNOSIS — R0989 Other specified symptoms and signs involving the circulatory and respiratory systems: Secondary | ICD-10-CM | POA: Diagnosis not present

## 2013-10-13 DIAGNOSIS — E039 Hypothyroidism, unspecified: Secondary | ICD-10-CM | POA: Diagnosis not present

## 2013-10-13 DIAGNOSIS — F411 Generalized anxiety disorder: Secondary | ICD-10-CM | POA: Diagnosis not present

## 2013-10-21 DIAGNOSIS — R32 Unspecified urinary incontinence: Secondary | ICD-10-CM | POA: Diagnosis not present

## 2013-10-30 DIAGNOSIS — N952 Postmenopausal atrophic vaginitis: Secondary | ICD-10-CM | POA: Diagnosis not present

## 2013-10-30 DIAGNOSIS — N3941 Urge incontinence: Secondary | ICD-10-CM | POA: Diagnosis not present

## 2013-10-30 DIAGNOSIS — R339 Retention of urine, unspecified: Secondary | ICD-10-CM | POA: Diagnosis not present

## 2014-01-05 DIAGNOSIS — F4322 Adjustment disorder with anxiety: Secondary | ICD-10-CM | POA: Diagnosis not present

## 2014-01-07 DIAGNOSIS — F4322 Adjustment disorder with anxiety: Secondary | ICD-10-CM | POA: Diagnosis not present

## 2014-01-14 DIAGNOSIS — F4322 Adjustment disorder with anxiety: Secondary | ICD-10-CM | POA: Diagnosis not present

## 2014-01-28 DIAGNOSIS — F4322 Adjustment disorder with anxiety: Secondary | ICD-10-CM | POA: Diagnosis not present

## 2014-02-08 DIAGNOSIS — L820 Inflamed seborrheic keratosis: Secondary | ICD-10-CM | POA: Diagnosis not present

## 2014-02-08 DIAGNOSIS — L578 Other skin changes due to chronic exposure to nonionizing radiation: Secondary | ICD-10-CM | POA: Diagnosis not present

## 2014-02-08 DIAGNOSIS — L570 Actinic keratosis: Secondary | ICD-10-CM | POA: Diagnosis not present

## 2014-02-08 DIAGNOSIS — Z85828 Personal history of other malignant neoplasm of skin: Secondary | ICD-10-CM | POA: Diagnosis not present

## 2014-02-11 DIAGNOSIS — F4322 Adjustment disorder with anxiety: Secondary | ICD-10-CM | POA: Diagnosis not present

## 2014-02-18 DIAGNOSIS — F4322 Adjustment disorder with anxiety: Secondary | ICD-10-CM | POA: Diagnosis not present

## 2014-03-01 DIAGNOSIS — K7460 Unspecified cirrhosis of liver: Secondary | ICD-10-CM | POA: Diagnosis not present

## 2014-03-01 DIAGNOSIS — G43909 Migraine, unspecified, not intractable, without status migrainosus: Secondary | ICD-10-CM | POA: Diagnosis not present

## 2014-03-01 DIAGNOSIS — N390 Urinary tract infection, site not specified: Secondary | ICD-10-CM | POA: Diagnosis not present

## 2014-03-01 DIAGNOSIS — N200 Calculus of kidney: Secondary | ICD-10-CM | POA: Diagnosis not present

## 2014-03-04 DIAGNOSIS — F4322 Adjustment disorder with anxiety: Secondary | ICD-10-CM | POA: Diagnosis not present

## 2014-03-10 DIAGNOSIS — E559 Vitamin D deficiency, unspecified: Secondary | ICD-10-CM | POA: Diagnosis not present

## 2014-03-10 DIAGNOSIS — K649 Unspecified hemorrhoids: Secondary | ICD-10-CM | POA: Diagnosis not present

## 2014-03-10 DIAGNOSIS — E785 Hyperlipidemia, unspecified: Secondary | ICD-10-CM | POA: Diagnosis not present

## 2014-03-10 DIAGNOSIS — K7689 Other specified diseases of liver: Secondary | ICD-10-CM | POA: Diagnosis not present

## 2014-03-10 DIAGNOSIS — R7309 Other abnormal glucose: Secondary | ICD-10-CM | POA: Diagnosis not present

## 2014-03-10 DIAGNOSIS — K7460 Unspecified cirrhosis of liver: Secondary | ICD-10-CM | POA: Diagnosis not present

## 2014-03-10 DIAGNOSIS — F4322 Adjustment disorder with anxiety: Secondary | ICD-10-CM | POA: Diagnosis not present

## 2014-03-22 DIAGNOSIS — S43429A Sprain of unspecified rotator cuff capsule, initial encounter: Secondary | ICD-10-CM | POA: Diagnosis not present

## 2014-03-22 DIAGNOSIS — M79609 Pain in unspecified limb: Secondary | ICD-10-CM | POA: Diagnosis not present

## 2014-03-22 DIAGNOSIS — IMO0002 Reserved for concepts with insufficient information to code with codable children: Secondary | ICD-10-CM | POA: Diagnosis not present

## 2014-03-22 DIAGNOSIS — S62639A Displaced fracture of distal phalanx of unspecified finger, initial encounter for closed fracture: Secondary | ICD-10-CM | POA: Diagnosis not present

## 2014-04-07 DIAGNOSIS — F4322 Adjustment disorder with anxiety: Secondary | ICD-10-CM | POA: Diagnosis not present

## 2014-04-08 DIAGNOSIS — M25519 Pain in unspecified shoulder: Secondary | ICD-10-CM | POA: Diagnosis not present

## 2014-04-08 DIAGNOSIS — M79609 Pain in unspecified limb: Secondary | ICD-10-CM | POA: Diagnosis not present

## 2014-04-14 DIAGNOSIS — F4322 Adjustment disorder with anxiety: Secondary | ICD-10-CM | POA: Diagnosis not present

## 2014-04-22 DIAGNOSIS — K649 Unspecified hemorrhoids: Secondary | ICD-10-CM | POA: Diagnosis not present

## 2014-04-22 DIAGNOSIS — K7581 Nonalcoholic steatohepatitis (NASH): Secondary | ICD-10-CM | POA: Diagnosis not present

## 2014-04-22 DIAGNOSIS — K7460 Unspecified cirrhosis of liver: Secondary | ICD-10-CM | POA: Diagnosis not present

## 2014-04-22 DIAGNOSIS — E785 Hyperlipidemia, unspecified: Secondary | ICD-10-CM | POA: Diagnosis not present

## 2014-04-29 DIAGNOSIS — F4322 Adjustment disorder with anxiety: Secondary | ICD-10-CM | POA: Diagnosis not present

## 2014-04-30 DIAGNOSIS — E039 Hypothyroidism, unspecified: Secondary | ICD-10-CM | POA: Diagnosis not present

## 2014-04-30 DIAGNOSIS — R450 Nervousness: Secondary | ICD-10-CM | POA: Diagnosis not present

## 2014-04-30 DIAGNOSIS — E162 Hypoglycemia, unspecified: Secondary | ICD-10-CM | POA: Diagnosis not present

## 2014-04-30 DIAGNOSIS — I10 Essential (primary) hypertension: Secondary | ICD-10-CM | POA: Diagnosis not present

## 2014-05-06 DIAGNOSIS — M25511 Pain in right shoulder: Secondary | ICD-10-CM | POA: Diagnosis not present

## 2014-05-06 DIAGNOSIS — M79645 Pain in left finger(s): Secondary | ICD-10-CM | POA: Diagnosis not present

## 2014-05-13 DIAGNOSIS — F4322 Adjustment disorder with anxiety: Secondary | ICD-10-CM | POA: Diagnosis not present

## 2014-05-31 DIAGNOSIS — S43409A Unspecified sprain of unspecified shoulder joint, initial encounter: Secondary | ICD-10-CM | POA: Diagnosis not present

## 2014-05-31 DIAGNOSIS — M25511 Pain in right shoulder: Secondary | ICD-10-CM | POA: Diagnosis not present

## 2014-06-01 DIAGNOSIS — F4322 Adjustment disorder with anxiety: Secondary | ICD-10-CM | POA: Diagnosis not present

## 2014-07-02 DIAGNOSIS — I10 Essential (primary) hypertension: Secondary | ICD-10-CM | POA: Diagnosis not present

## 2014-07-02 DIAGNOSIS — E039 Hypothyroidism, unspecified: Secondary | ICD-10-CM | POA: Diagnosis not present

## 2014-07-02 DIAGNOSIS — I341 Nonrheumatic mitral (valve) prolapse: Secondary | ICD-10-CM | POA: Diagnosis not present

## 2014-07-02 DIAGNOSIS — F419 Anxiety disorder, unspecified: Secondary | ICD-10-CM | POA: Diagnosis not present

## 2014-07-09 DIAGNOSIS — Z23 Encounter for immunization: Secondary | ICD-10-CM | POA: Diagnosis not present

## 2014-07-09 DIAGNOSIS — F419 Anxiety disorder, unspecified: Secondary | ICD-10-CM | POA: Diagnosis not present

## 2014-07-09 DIAGNOSIS — E039 Hypothyroidism, unspecified: Secondary | ICD-10-CM | POA: Diagnosis not present

## 2014-07-09 DIAGNOSIS — I10 Essential (primary) hypertension: Secondary | ICD-10-CM | POA: Diagnosis not present

## 2014-07-09 DIAGNOSIS — I341 Nonrheumatic mitral (valve) prolapse: Secondary | ICD-10-CM | POA: Diagnosis not present

## 2014-08-20 DIAGNOSIS — F4322 Adjustment disorder with anxiety: Secondary | ICD-10-CM | POA: Diagnosis not present

## 2014-08-27 DIAGNOSIS — F4322 Adjustment disorder with anxiety: Secondary | ICD-10-CM | POA: Diagnosis not present

## 2014-08-30 DIAGNOSIS — F4322 Adjustment disorder with anxiety: Secondary | ICD-10-CM | POA: Diagnosis not present

## 2014-09-07 DIAGNOSIS — F4322 Adjustment disorder with anxiety: Secondary | ICD-10-CM | POA: Diagnosis not present

## 2014-09-14 DIAGNOSIS — F4322 Adjustment disorder with anxiety: Secondary | ICD-10-CM | POA: Diagnosis not present

## 2014-10-19 DIAGNOSIS — I234 Rupture of chordae tendineae as current complication following acute myocardial infarction: Secondary | ICD-10-CM | POA: Diagnosis not present

## 2014-10-19 DIAGNOSIS — K7581 Nonalcoholic steatohepatitis (NASH): Secondary | ICD-10-CM | POA: Diagnosis not present

## 2014-10-19 DIAGNOSIS — R5381 Other malaise: Secondary | ICD-10-CM | POA: Diagnosis not present

## 2014-10-19 LAB — BASIC METABOLIC PANEL
BUN: 10 mg/dL (ref 4–21)
Creatinine: 1 mg/dL (ref <=1.1)
Glucose: 80 mg/dL
Potassium: 4.6 mmol/L (ref 3.4–5.3)
Sodium: 142 mmol/L (ref 137–147)

## 2014-10-19 LAB — HEPATIC FUNCTION PANEL
ALT: 30 U/L (ref 7–35)
AST: 37 U/L — AB (ref 13–35)

## 2014-10-19 LAB — CBC AND DIFFERENTIAL
HCT: 39 % (ref 36–46)
HEMOGLOBIN: 12.8 g/dL (ref 12.0–16.0)
PLATELETS: 252 10*3/uL (ref 150–399)
WBC: 6.7 10*3/mL

## 2014-10-20 DIAGNOSIS — R634 Abnormal weight loss: Secondary | ICD-10-CM | POA: Diagnosis not present

## 2014-10-20 DIAGNOSIS — R4182 Altered mental status, unspecified: Secondary | ICD-10-CM | POA: Diagnosis not present

## 2014-10-20 DIAGNOSIS — K7460 Unspecified cirrhosis of liver: Secondary | ICD-10-CM | POA: Diagnosis not present

## 2014-10-20 DIAGNOSIS — K7581 Nonalcoholic steatohepatitis (NASH): Secondary | ICD-10-CM | POA: Diagnosis not present

## 2014-10-22 DIAGNOSIS — E039 Hypothyroidism, unspecified: Secondary | ICD-10-CM | POA: Diagnosis not present

## 2014-10-22 DIAGNOSIS — K7581 Nonalcoholic steatohepatitis (NASH): Secondary | ICD-10-CM | POA: Diagnosis not present

## 2014-10-22 DIAGNOSIS — I234 Rupture of chordae tendineae as current complication following acute myocardial infarction: Secondary | ICD-10-CM | POA: Diagnosis not present

## 2014-10-26 DIAGNOSIS — G8929 Other chronic pain: Secondary | ICD-10-CM | POA: Insufficient documentation

## 2014-10-26 DIAGNOSIS — M25511 Pain in right shoulder: Secondary | ICD-10-CM | POA: Diagnosis not present

## 2014-11-18 DIAGNOSIS — M2040 Other hammer toe(s) (acquired), unspecified foot: Secondary | ICD-10-CM | POA: Insufficient documentation

## 2014-11-18 DIAGNOSIS — M19071 Primary osteoarthritis, right ankle and foot: Secondary | ICD-10-CM | POA: Diagnosis not present

## 2014-11-18 DIAGNOSIS — K7581 Nonalcoholic steatohepatitis (NASH): Secondary | ICD-10-CM | POA: Diagnosis not present

## 2014-11-18 DIAGNOSIS — M79671 Pain in right foot: Secondary | ICD-10-CM | POA: Diagnosis not present

## 2014-11-18 DIAGNOSIS — M79672 Pain in left foot: Secondary | ICD-10-CM | POA: Diagnosis not present

## 2014-11-18 DIAGNOSIS — R4182 Altered mental status, unspecified: Secondary | ICD-10-CM | POA: Diagnosis not present

## 2014-11-18 DIAGNOSIS — M216X1 Other acquired deformities of right foot: Secondary | ICD-10-CM | POA: Diagnosis not present

## 2014-11-18 DIAGNOSIS — M2041 Other hammer toe(s) (acquired), right foot: Secondary | ICD-10-CM | POA: Diagnosis not present

## 2014-11-18 DIAGNOSIS — K7460 Unspecified cirrhosis of liver: Secondary | ICD-10-CM | POA: Diagnosis not present

## 2014-11-18 DIAGNOSIS — M19072 Primary osteoarthritis, left ankle and foot: Secondary | ICD-10-CM | POA: Diagnosis not present

## 2014-11-18 DIAGNOSIS — N1330 Unspecified hydronephrosis: Secondary | ICD-10-CM | POA: Diagnosis not present

## 2014-11-18 DIAGNOSIS — M2042 Other hammer toe(s) (acquired), left foot: Secondary | ICD-10-CM | POA: Diagnosis not present

## 2014-11-18 DIAGNOSIS — M2022 Hallux rigidus, left foot: Secondary | ICD-10-CM | POA: Insufficient documentation

## 2014-11-18 DIAGNOSIS — M2021 Hallux rigidus, right foot: Secondary | ICD-10-CM | POA: Insufficient documentation

## 2014-11-18 DIAGNOSIS — M216X2 Other acquired deformities of left foot: Secondary | ICD-10-CM | POA: Diagnosis not present

## 2014-11-23 DIAGNOSIS — K7581 Nonalcoholic steatohepatitis (NASH): Secondary | ICD-10-CM | POA: Diagnosis not present

## 2014-11-23 DIAGNOSIS — I234 Rupture of chordae tendineae as current complication following acute myocardial infarction: Secondary | ICD-10-CM | POA: Diagnosis not present

## 2014-11-23 DIAGNOSIS — Z01818 Encounter for other preprocedural examination: Secondary | ICD-10-CM | POA: Diagnosis not present

## 2014-11-23 DIAGNOSIS — E039 Hypothyroidism, unspecified: Secondary | ICD-10-CM | POA: Diagnosis not present

## 2014-11-23 LAB — TSH: TSH: 0.53 u[IU]/mL (ref <=5.90)

## 2014-11-25 DIAGNOSIS — M19072 Primary osteoarthritis, left ankle and foot: Secondary | ICD-10-CM | POA: Diagnosis not present

## 2014-11-25 DIAGNOSIS — M19071 Primary osteoarthritis, right ankle and foot: Secondary | ICD-10-CM | POA: Diagnosis not present

## 2014-11-25 DIAGNOSIS — M2021 Hallux rigidus, right foot: Secondary | ICD-10-CM | POA: Diagnosis not present

## 2014-11-25 DIAGNOSIS — M2022 Hallux rigidus, left foot: Secondary | ICD-10-CM | POA: Diagnosis not present

## 2014-12-01 DIAGNOSIS — M216X1 Other acquired deformities of right foot: Secondary | ICD-10-CM | POA: Diagnosis not present

## 2014-12-01 DIAGNOSIS — M2041 Other hammer toe(s) (acquired), right foot: Secondary | ICD-10-CM | POA: Diagnosis not present

## 2014-12-01 DIAGNOSIS — M2021 Hallux rigidus, right foot: Secondary | ICD-10-CM | POA: Diagnosis not present

## 2014-12-06 DIAGNOSIS — J309 Allergic rhinitis, unspecified: Secondary | ICD-10-CM | POA: Diagnosis not present

## 2014-12-06 DIAGNOSIS — J029 Acute pharyngitis, unspecified: Secondary | ICD-10-CM | POA: Diagnosis not present

## 2014-12-06 DIAGNOSIS — Z4889 Encounter for other specified surgical aftercare: Secondary | ICD-10-CM | POA: Diagnosis not present

## 2014-12-09 DIAGNOSIS — M79671 Pain in right foot: Secondary | ICD-10-CM | POA: Diagnosis not present

## 2014-12-09 DIAGNOSIS — M2021 Hallux rigidus, right foot: Secondary | ICD-10-CM | POA: Diagnosis not present

## 2014-12-14 DIAGNOSIS — F4322 Adjustment disorder with anxiety: Secondary | ICD-10-CM | POA: Diagnosis not present

## 2014-12-21 DIAGNOSIS — M2021 Hallux rigidus, right foot: Secondary | ICD-10-CM | POA: Diagnosis not present

## 2014-12-30 DIAGNOSIS — M2021 Hallux rigidus, right foot: Secondary | ICD-10-CM | POA: Diagnosis not present

## 2014-12-30 DIAGNOSIS — M216X1 Other acquired deformities of right foot: Secondary | ICD-10-CM | POA: Diagnosis not present

## 2014-12-30 DIAGNOSIS — M2041 Other hammer toe(s) (acquired), right foot: Secondary | ICD-10-CM | POA: Diagnosis not present

## 2014-12-31 DIAGNOSIS — F4322 Adjustment disorder with anxiety: Secondary | ICD-10-CM | POA: Diagnosis not present

## 2015-01-07 DIAGNOSIS — F4322 Adjustment disorder with anxiety: Secondary | ICD-10-CM | POA: Diagnosis not present

## 2015-01-11 DIAGNOSIS — M2021 Hallux rigidus, right foot: Secondary | ICD-10-CM | POA: Diagnosis not present

## 2015-01-11 DIAGNOSIS — M216X1 Other acquired deformities of right foot: Secondary | ICD-10-CM | POA: Diagnosis not present

## 2015-01-11 DIAGNOSIS — M2041 Other hammer toe(s) (acquired), right foot: Secondary | ICD-10-CM | POA: Diagnosis not present

## 2015-01-12 DIAGNOSIS — K7460 Unspecified cirrhosis of liver: Secondary | ICD-10-CM | POA: Insufficient documentation

## 2015-01-12 DIAGNOSIS — E039 Hypothyroidism, unspecified: Secondary | ICD-10-CM | POA: Diagnosis not present

## 2015-01-12 DIAGNOSIS — K7581 Nonalcoholic steatohepatitis (NASH): Secondary | ICD-10-CM | POA: Diagnosis not present

## 2015-01-12 DIAGNOSIS — E669 Obesity, unspecified: Secondary | ICD-10-CM | POA: Diagnosis not present

## 2015-01-12 DIAGNOSIS — K5909 Other constipation: Secondary | ICD-10-CM | POA: Diagnosis not present

## 2015-01-27 ENCOUNTER — Telehealth: Payer: Self-pay | Admitting: Family Medicine

## 2015-01-27 DIAGNOSIS — F419 Anxiety disorder, unspecified: Secondary | ICD-10-CM

## 2015-01-27 MED ORDER — ALPRAZOLAM 1 MG PO TABS: 1.0000 mg | ORAL_TABLET | Freq: Every day | ORAL | Status: DC | PRN

## 2015-01-27 NOTE — Telephone Encounter (Signed)
Pt. Called stating she will be going out of town and would like for a nurse to call her back with instructions on how to use her oxygen. CB# [phone] CC

## 2015-01-27 NOTE — Telephone Encounter (Signed)
Talked with patient, really do not have capability to provide this service last minute. Is going to reach out to medical  Community where she is going. Thanks.

## 2015-01-27 NOTE — Telephone Encounter (Signed)
Pt says she is going to spend several months with her daughter in [place].  She reports the last time she was in [place] she had trouble with breathing, and was told she would need O2.  She says her daughter already has an O2 tank from her father-in-law.  Anita Bennett needed advise on what she needs to set the O2 at.  She also needs a refill on her Xanax.  [facility] [address]  [employer],   -Mickel Baas

## 2015-01-28 NOTE — Telephone Encounter (Signed)
RX called in.   Thanks,   -Mickel Baas

## 2015-02-25 DIAGNOSIS — S40011A Contusion of right shoulder, initial encounter: Secondary | ICD-10-CM | POA: Diagnosis not present

## 2015-02-25 DIAGNOSIS — M25511 Pain in right shoulder: Secondary | ICD-10-CM | POA: Diagnosis not present

## 2015-02-25 DIAGNOSIS — Z9181 History of falling: Secondary | ICD-10-CM | POA: Diagnosis not present

## 2015-02-25 DIAGNOSIS — W19XXXA Unspecified fall, initial encounter: Secondary | ICD-10-CM | POA: Diagnosis not present

## 2015-03-08 DIAGNOSIS — M25511 Pain in right shoulder: Secondary | ICD-10-CM | POA: Diagnosis not present

## 2015-03-18 DIAGNOSIS — S46011A Strain of muscle(s) and tendon(s) of the rotator cuff of right shoulder, initial encounter: Secondary | ICD-10-CM | POA: Diagnosis not present

## 2015-03-18 DIAGNOSIS — M25511 Pain in right shoulder: Secondary | ICD-10-CM | POA: Diagnosis not present

## 2015-03-18 DIAGNOSIS — W19XXXA Unspecified fall, initial encounter: Secondary | ICD-10-CM | POA: Diagnosis not present

## 2015-04-12 DIAGNOSIS — M75121 Complete rotator cuff tear or rupture of right shoulder, not specified as traumatic: Secondary | ICD-10-CM | POA: Diagnosis not present

## 2015-04-12 DIAGNOSIS — M25511 Pain in right shoulder: Secondary | ICD-10-CM | POA: Diagnosis not present

## 2015-04-12 DIAGNOSIS — G8929 Other chronic pain: Secondary | ICD-10-CM | POA: Diagnosis not present

## 2015-04-18 DIAGNOSIS — R2689 Other abnormalities of gait and mobility: Secondary | ICD-10-CM | POA: Diagnosis not present

## 2015-04-18 DIAGNOSIS — G8929 Other chronic pain: Secondary | ICD-10-CM | POA: Diagnosis not present

## 2015-04-18 DIAGNOSIS — M542 Cervicalgia: Secondary | ICD-10-CM | POA: Diagnosis not present

## 2015-04-18 DIAGNOSIS — M25511 Pain in right shoulder: Secondary | ICD-10-CM | POA: Diagnosis not present

## 2015-04-20 DIAGNOSIS — F4322 Adjustment disorder with anxiety: Secondary | ICD-10-CM | POA: Diagnosis not present

## 2015-04-25 DIAGNOSIS — F4322 Adjustment disorder with anxiety: Secondary | ICD-10-CM | POA: Diagnosis not present

## 2015-04-26 DIAGNOSIS — R2689 Other abnormalities of gait and mobility: Secondary | ICD-10-CM | POA: Diagnosis not present

## 2015-04-26 DIAGNOSIS — M542 Cervicalgia: Secondary | ICD-10-CM | POA: Diagnosis not present

## 2015-04-26 DIAGNOSIS — M25511 Pain in right shoulder: Secondary | ICD-10-CM | POA: Diagnosis not present

## 2015-04-26 DIAGNOSIS — G8929 Other chronic pain: Secondary | ICD-10-CM | POA: Diagnosis not present

## 2015-05-02 DIAGNOSIS — E669 Obesity, unspecified: Secondary | ICD-10-CM | POA: Insufficient documentation

## 2015-05-02 DIAGNOSIS — M159 Polyosteoarthritis, unspecified: Secondary | ICD-10-CM | POA: Insufficient documentation

## 2015-05-02 DIAGNOSIS — I340 Nonrheumatic mitral (valve) insufficiency: Secondary | ICD-10-CM | POA: Insufficient documentation

## 2015-05-02 DIAGNOSIS — I10 Essential (primary) hypertension: Secondary | ICD-10-CM | POA: Insufficient documentation

## 2015-05-02 DIAGNOSIS — E039 Hypothyroidism, unspecified: Secondary | ICD-10-CM | POA: Insufficient documentation

## 2015-05-02 DIAGNOSIS — R5381 Other malaise: Secondary | ICD-10-CM | POA: Insufficient documentation

## 2015-05-02 DIAGNOSIS — E785 Hyperlipidemia, unspecified: Secondary | ICD-10-CM | POA: Insufficient documentation

## 2015-05-02 DIAGNOSIS — R5383 Other fatigue: Secondary | ICD-10-CM

## 2015-05-02 DIAGNOSIS — J309 Allergic rhinitis, unspecified: Secondary | ICD-10-CM | POA: Insufficient documentation

## 2015-05-03 ENCOUNTER — Encounter: Payer: Self-pay | Admitting: Family Medicine

## 2015-05-03 ENCOUNTER — Ambulatory Visit (INDEPENDENT_AMBULATORY_CARE_PROVIDER_SITE_OTHER): Payer: Medicare Other | Admitting: Family Medicine

## 2015-05-03 VITALS — BP 160/78 | HR 64 | Temp 98.1°F | Resp 16 | Ht 66.25 in | Wt 217.6 lb

## 2015-05-03 DIAGNOSIS — F4322 Adjustment disorder with anxiety: Secondary | ICD-10-CM | POA: Diagnosis not present

## 2015-05-03 DIAGNOSIS — G629 Polyneuropathy, unspecified: Secondary | ICD-10-CM

## 2015-05-03 DIAGNOSIS — Z111 Encounter for screening for respiratory tuberculosis: Secondary | ICD-10-CM

## 2015-05-03 DIAGNOSIS — I10 Essential (primary) hypertension: Secondary | ICD-10-CM

## 2015-05-03 DIAGNOSIS — E038 Other specified hypothyroidism: Secondary | ICD-10-CM | POA: Diagnosis not present

## 2015-05-03 DIAGNOSIS — Z23 Encounter for immunization: Secondary | ICD-10-CM | POA: Diagnosis not present

## 2015-05-03 DIAGNOSIS — G64 Other disorders of peripheral nervous system: Secondary | ICD-10-CM | POA: Diagnosis not present

## 2015-05-03 DIAGNOSIS — R413 Other amnesia: Secondary | ICD-10-CM | POA: Diagnosis not present

## 2015-05-03 MED ORDER — VITAMIN B-12 1000 MCG PO TABS: 1000.0000 ug | ORAL_TABLET | Freq: Every day | ORAL | Status: DC

## 2015-05-03 NOTE — Progress Notes (Signed)
Subjective:    Patient ID: Anita Bennett, female    DOB: 06/26/1941, 74 y.o.   MRN: [id number]  HPI  Weight Loss This has been happening for 2 months. Pt reports she weighed as much as 226 lb, and is weighing in today at 217.6lb. Pt reports that she loses weight every time she visits her daughter in [place].  Did fall and has torn rotator cuff. May need surgery. Pt denies change in appetite, diet, or exercise habits. Did loose a bunch and did put some back on.  Also have night sweats.  Really has not lost weight except when in [place]. Did use oxygen this time. Did not have any migraines. Did feel better.   Would like some labs done. Is having some memory issues.  Is not currently taking her B12 supplement.    Review of Systems  Constitutional: Positive for diaphoresis (night sweats (new problem)) and unexpected weight change. Negative for fever, chills, activity change, appetite change and fatigue.  Respiratory: Negative for cough, chest tightness, shortness of breath and wheezing.   Cardiovascular: Negative for chest pain, palpitations and leg swelling.     Patient Active Problem List   Diagnosis Date Noted  . Allergic rhinitis 05/02/2015  . Blood glucose elevated 05/02/2015  . HLD (hyperlipidemia) 05/02/2015  . Benign hypertension 05/02/2015  . Adult hypothyroidism 05/02/2015  . Malaise and fatigue 05/02/2015  . Anxiety, mild 05/02/2015  . Billowing mitral valve 05/02/2015  . NASH (nonalcoholic steatohepatitis) 05/02/2015  . Adiposity 05/02/2015  . Arthritis, degenerative 05/02/2015  . Disorder of peripheral nervous system ([place]) 05/02/2015  . Anxiety 01/27/2015  . Hepatic cirrhosis ([place]) 01/12/2015  . Hammer toe 11/18/2014  . Hallux rigidus of right foot 11/18/2014  . Hallux rigidus of left foot 11/18/2014  . Arthritis of ankle or foot, degenerative 11/18/2014  . Pain in shoulder 10/26/2014  . Peripheral nerve disease ([place]) 10/22/2012  . Arthritis of knee,  degenerative 10/16/2011   Past Medical History  Diagnosis Date  . History of toe surgery     12/01/14 [facility]  . H/O knee surgery     2011 [facility] knee (replacement)   Current Outpatient Prescriptions on File Prior to Visit  Medication Sig  . ALPRAZolam (XANAX) 1 MG tablet Take 1 tablet (1 mg total) by mouth daily as needed for anxiety.  Marland Kitchen levothyroxine (SYNTHROID, LEVOTHROID) 100 MCG tablet Take 1 tablet by mouth daily.  . metoprolol succinate (TOPROL-XL) 25 MG 24 hr tablet Take 1 tablet by mouth daily.  Marland Kitchen PARoxetine (PAXIL) 20 MG tablet Take 1 tablet by mouth daily.  . fluticasone (FLONASE) 50 MCG/ACT nasal spray Place 2 sprays into the nose daily.  . montelukast (SINGULAIR) 10 MG tablet Take 1 tablet by mouth daily.   No current facility-administered medications on file prior to visit.   Allergies  Allergen Reactions  . Epinephrine     Heart race  . Promethazine Hcl     hyperactivity   No past surgical history on file. Social History   Social History  . Marital Status: Married    Spouse Name: N/A  . Number of Children: N/A  . Years of Education: N/A   Occupational History  . Not on file.   Social History Main Topics  . Smoking status: Never Smoker   . Smokeless tobacco: Never Used  . Alcohol Use: No  . Drug Use: No  . Sexual Activity: Not on file   Other Topics Concern  . Not on file  Social History Narrative   No family history on file.      Objective:   Physical Exam  Constitutional: She is oriented to person, place, and time. She appears well-nourished.  Cardiovascular: Normal rate and regular rhythm.   Pulmonary/Chest: Effort normal and breath sounds normal.  Neurological: She is alert and oriented to person, place, and time.  Psychiatric: She has a normal mood and affect. Her behavior is normal. Judgment and thought content normal.   BP 160/78 mmHg  Pulse 64  Temp(Src) 98.1 F (36.7 C) (Oral)  Resp 16  Ht 5' 6.25" (1.683 m)  Wt 217 lb 9.6 oz  (98.703 kg)  BMI 34.85 kg/m2      Assessment & Plan:  1. 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   - Quantiferon tb gold assay (blood)  2. Flu vaccine need Given today.  - Flu vaccine HIGH DOSE PF  3. Benign hypertension Stable. Check labs.  - CBC with Differential/Platelet - Comprehensive metabolic panel  4. Other specified hypothyroidism Will recheck labs in light of weight concerns and memory loss.  Start  B12 1000 mcg daily  - TSH  5. Peripheral nerve disease ([place]) Start vitamin B12.    6. Memory loss Mildly worse. Will start B12 supplement.  Call if worsens or does not improve.   Margarita Rana, MD

## 2015-05-05 ENCOUNTER — Telehealth: Payer: Self-pay

## 2015-05-05 LAB — COMPREHENSIVE METABOLIC PANEL
ALBUMIN: 4.3 g/dL (ref 3.5–4.8)
ALK PHOS: 71 IU/L (ref 39–117)
ALT: 19 IU/L (ref 0–32)
AST: 31 IU/L (ref 0–40)
Albumin/Globulin Ratio: 1.5 (ref 1.1–2.5)
BUN / CREAT RATIO: 13 (ref 11–26)
BUN: 12 mg/dL (ref 8–27)
Bilirubin Total: 0.9 mg/dL (ref 0.0–1.2)
CO2: 26 mmol/L (ref 18–29)
CREATININE: 0.92 mg/dL (ref 0.57–1.00)
Calcium: 9.4 mg/dL (ref 8.7–10.3)
Chloride: 101 mmol/L (ref 97–108)
GFR, EST AFRICAN AMERICAN: 70 mL/min/{1.73_m2} (ref >59)
GFR, EST NON AFRICAN AMERICAN: 61 mL/min/{1.73_m2} (ref >59)
GLOBULIN, TOTAL: 2.9 g/dL (ref 1.5–4.5)
GLUCOSE: 86 mg/dL (ref 65–99)
Potassium: 5.1 mmol/L (ref 3.5–5.2)
SODIUM: 142 mmol/L (ref 134–144)
TOTAL PROTEIN: 7.2 g/dL (ref 6.0–8.5)

## 2015-05-05 LAB — CBC WITH DIFFERENTIAL/PLATELET
BASOS: 1 %
Basophils Absolute: 0.1 10*3/uL (ref 0.0–0.2)
EOS (ABSOLUTE): 0.2 10*3/uL (ref 0.0–0.4)
EOS: 3 %
HEMATOCRIT: 38.9 % (ref 34.0–46.6)
HEMOGLOBIN: 12.5 g/dL (ref 11.1–15.9)
Immature Grans (Abs): 0 10*3/uL (ref 0.0–0.1)
Immature Granulocytes: 0 %
LYMPHS ABS: 3 10*3/uL (ref 0.7–3.1)
Lymphs: 41 %
MCH: 29.2 pg (ref 26.6–33.0)
MCHC: 32.1 g/dL (ref 31.5–35.7)
MCV: 91 fL (ref 79–97)
MONOCYTES: 8 %
Monocytes Absolute: 0.6 10*3/uL (ref 0.1–0.9)
Neutrophils Absolute: 3.6 10*3/uL (ref 1.4–7.0)
Neutrophils: 47 %
Platelets: 222 10*3/uL (ref 150–379)
RBC: 4.28 x10E6/uL (ref 3.77–5.28)
RDW: 15.5 % — ABNORMAL HIGH (ref 12.3–15.4)
WBC: 7.5 10*3/uL (ref 3.4–10.8)

## 2015-05-05 LAB — QUANTIFERON IN TUBE
QFT TB AG MINUS NIL VALUE: <0 IU/mL
QUANTIFERON MITOGEN VALUE: 9.35 IU/mL
QUANTIFERON NIL VALUE: 0.04 [IU]/mL
QUANTIFERON TB AG VALUE: 0.03 [IU]/mL
QUANTIFERON TB GOLD: NEGATIVE

## 2015-05-05 LAB — QUANTIFERON TB GOLD ASSAY (BLOOD)

## 2015-05-05 LAB — TSH: TSH: 0.919 u[IU]/mL (ref 0.450–4.500)

## 2015-05-05 NOTE — Telephone Encounter (Signed)
-----   Message from Margarita Rana, MD sent at 05/05/2015  7:57 AM EDT ----- Labs stable.   TB negative. Please notify patient. Thanks.

## 2015-05-05 NOTE — Telephone Encounter (Signed)
Pt advised; a copy of the labs are at the front desk for her to pick up.   Thanks,   -Mickel Baas

## 2015-05-10 DIAGNOSIS — M25511 Pain in right shoulder: Secondary | ICD-10-CM | POA: Diagnosis not present

## 2015-05-10 DIAGNOSIS — M75121 Complete rotator cuff tear or rupture of right shoulder, not specified as traumatic: Secondary | ICD-10-CM | POA: Diagnosis not present

## 2015-05-10 DIAGNOSIS — G8929 Other chronic pain: Secondary | ICD-10-CM | POA: Diagnosis not present

## 2015-05-18 DIAGNOSIS — F4322 Adjustment disorder with anxiety: Secondary | ICD-10-CM | POA: Diagnosis not present

## 2015-05-19 DIAGNOSIS — L820 Inflamed seborrheic keratosis: Secondary | ICD-10-CM | POA: Diagnosis not present

## 2015-05-19 DIAGNOSIS — Z85828 Personal history of other malignant neoplasm of skin: Secondary | ICD-10-CM | POA: Diagnosis not present

## 2015-05-19 DIAGNOSIS — L821 Other seborrheic keratosis: Secondary | ICD-10-CM | POA: Diagnosis not present

## 2015-05-25 DIAGNOSIS — M25511 Pain in right shoulder: Secondary | ICD-10-CM | POA: Diagnosis not present

## 2015-05-25 DIAGNOSIS — R2689 Other abnormalities of gait and mobility: Secondary | ICD-10-CM | POA: Diagnosis not present

## 2015-05-25 DIAGNOSIS — M542 Cervicalgia: Secondary | ICD-10-CM | POA: Diagnosis not present

## 2015-05-25 DIAGNOSIS — G8929 Other chronic pain: Secondary | ICD-10-CM | POA: Diagnosis not present

## 2015-06-01 DIAGNOSIS — F4322 Adjustment disorder with anxiety: Secondary | ICD-10-CM | POA: Diagnosis not present

## 2015-06-14 DIAGNOSIS — G8929 Other chronic pain: Secondary | ICD-10-CM | POA: Diagnosis not present

## 2015-06-14 DIAGNOSIS — M25511 Pain in right shoulder: Secondary | ICD-10-CM | POA: Diagnosis not present

## 2015-06-14 DIAGNOSIS — R2689 Other abnormalities of gait and mobility: Secondary | ICD-10-CM | POA: Diagnosis not present

## 2015-06-14 DIAGNOSIS — M542 Cervicalgia: Secondary | ICD-10-CM | POA: Diagnosis not present

## 2015-06-24 ENCOUNTER — Telehealth: Payer: Self-pay | Admitting: Family Medicine

## 2015-06-24 NOTE — Telephone Encounter (Signed)
Please check records and  call patient and clarify what PT is for, then ok to order. Thanks.

## 2015-06-24 NOTE — Telephone Encounter (Signed)
Pt called saying that she has been going to physical therapy at [facility].  She wants to start going locally and she will need to have a referral to change to somewhere local.  Dr Gerald Dexter at [facility] in orthopaedics ordered her PT but they said her primary has to authorize   .  Pt Call back is [phone]  Thanks Con Memos

## 2015-06-24 NOTE — Telephone Encounter (Signed)
LMTCB 06/24/2015  Thanks,   -Mickel Baas

## 2015-06-28 NOTE — Telephone Encounter (Signed)
I spoke with Ms. Burmaster, she would like to use the physical therapists at [facility].  She says it is for Gait Instability.  She says she is going to call back tomorrow with her current PT's information so [facility] can request records.   Thanks,   -Mickel Baas

## 2015-06-29 DIAGNOSIS — F4322 Adjustment disorder with anxiety: Secondary | ICD-10-CM | POA: Diagnosis not present

## 2015-07-01 ENCOUNTER — Other Ambulatory Visit: Payer: Self-pay | Admitting: Family Medicine

## 2015-07-01 DIAGNOSIS — E038 Other specified hypothyroidism: Secondary | ICD-10-CM

## 2015-07-13 DIAGNOSIS — F4322 Adjustment disorder with anxiety: Secondary | ICD-10-CM | POA: Diagnosis not present

## 2015-08-03 DIAGNOSIS — F4322 Adjustment disorder with anxiety: Secondary | ICD-10-CM | POA: Diagnosis not present

## 2015-08-10 DIAGNOSIS — F4322 Adjustment disorder with anxiety: Secondary | ICD-10-CM | POA: Diagnosis not present

## 2015-08-17 DIAGNOSIS — F4322 Adjustment disorder with anxiety: Secondary | ICD-10-CM | POA: Diagnosis not present

## 2015-09-02 ENCOUNTER — Encounter: Payer: Self-pay | Admitting: Family Medicine

## 2015-09-02 ENCOUNTER — Ambulatory Visit (INDEPENDENT_AMBULATORY_CARE_PROVIDER_SITE_OTHER): Payer: Medicare Other | Admitting: Family Medicine

## 2015-09-02 VITALS — BP 126/74 | HR 68 | Temp 98.6°F | Resp 16 | Wt 224.0 lb

## 2015-09-02 DIAGNOSIS — J069 Acute upper respiratory infection, unspecified: Secondary | ICD-10-CM

## 2015-09-02 DIAGNOSIS — F419 Anxiety disorder, unspecified: Secondary | ICD-10-CM

## 2015-09-02 LAB — POCT INFLUENZA A/B
INFLUENZA A, POC: NEGATIVE
INFLUENZA B, POC: NEGATIVE

## 2015-09-02 MED ORDER — ALPRAZOLAM 1 MG PO TABS: 1.0000 mg | ORAL_TABLET | Freq: Every day | ORAL | Status: DC | PRN

## 2015-09-02 MED ORDER — HYDROCODONE-HOMATROPINE 5-1.5 MG/5ML PO SYRP: 5.0000 mL | ORAL_SOLUTION | Freq: Three times a day (TID) | ORAL | Status: DC | PRN

## 2015-09-02 NOTE — Progress Notes (Signed)
Patient ID: Anita Bennett, female   DOB: Sep 20, 1940, 75 y.o.   MRN: [id number]         Patient: Anita Bennett Female    DOB: 06/05/1941   75 y.o.   MRN: [id number] Visit Date: 09/02/2015  Today's Provider: Margarita Rana, MD   Chief Complaint  Patient presents with  . URI   Subjective:    URI  This is a new problem. The current episode started in the past 7 days. The problem has been gradually worsening. Associated symptoms include congestion, coughing, ear pain, headaches, rhinorrhea, a sore throat and wheezing. Pertinent negatives include no joint pain, joint swelling, nausea, neck pain, plugged ear sensation, rash, sinus pain, sneezing, swollen glands or vomiting. She has tried acetaminophen for the symptoms.   Also needs her Xanax refilled.  Uses it when she flies. No history of abuse.      Allergies  Allergen Reactions  . Epinephrine     Heart race  . Promethazine Hcl     hyperactivity   Previous Medications   ACETAMINOPHEN (TYLENOL) 500 MG TABLET    Take 500 mg by mouth every 6 (six) hours as needed.   ALPRAZOLAM (XANAX) 1 MG TABLET    Take 1 tablet (1 mg total) by mouth daily as needed for anxiety.   FLUTICASONE (FLONASE) 50 MCG/ACT NASAL SPRAY    Place 2 sprays into the nose daily.   LEVOTHYROXINE (SYNTHROID, LEVOTHROID) 100 MCG TABLET    take 1 tablet by mouth once daily   METOPROLOL SUCCINATE (TOPROL-XL) 25 MG 24 HR TABLET    Take 1 tablet by mouth daily.   MONTELUKAST (SINGULAIR) 10 MG TABLET    Take 1 tablet by mouth daily.   PAROXETINE (PAXIL) 20 MG TABLET    Take 1 tablet by mouth daily.   VITAMIN B-12 (CYANOCOBALAMIN) 1000 MCG TABLET    Take 1 tablet (1,000 mcg total) by mouth daily.    Review of Systems  Constitutional: Positive for activity change and fatigue. Negative for fever, chills, diaphoresis, appetite change and unexpected weight change.  HENT: Positive for congestion, ear pain, rhinorrhea and sore throat. Negative for ear discharge, nosebleeds,  postnasal drip, sinus pressure, sneezing, tinnitus, trouble swallowing and voice change.   Eyes: Negative for photophobia, pain, discharge, redness, itching and visual disturbance.  Respiratory: Positive for cough, chest tightness, shortness of breath and wheezing. Negative for apnea, choking and stridor.   Cardiovascular: Negative.   Gastrointestinal: Negative.  Negative for nausea and vomiting.  Musculoskeletal: Negative for joint pain and neck pain.  Skin: Negative for rash.  Neurological: Positive for light-headedness and headaches. Negative for dizziness.    Social History  Substance Use Topics  . Smoking status: Never Smoker   . Smokeless tobacco: Never Used  . Alcohol Use: No   Objective:   BP 126/74 mmHg  Pulse 68  Temp(Src) 98.6 F (37 C) (Oral)  Resp 16  Wt 224 lb (101.606 kg)  Physical Exam  Constitutional: She is oriented to person, place, and time. She appears well-developed and well-nourished.  HENT:  Head: Normocephalic and atraumatic.  Right Ear: External ear normal.  Left Ear: External ear normal.  Mouth/Throat: Oropharynx is clear and moist.  Cardiovascular: Normal rate, regular rhythm, normal heart sounds and intact distal pulses.   Pulmonary/Chest: Effort normal and breath sounds normal.  Neurological: She is alert and oriented to person, place, and time.  Psychiatric: She has a normal mood and affect. Her speech is normal and  behavior is normal. Judgment and thought content normal. Cognition and memory are normal.      Assessment & Plan:     1. Upper respiratory infection Flu negative. Suspect another virus.   Hycodan for cough. Warned about possible addiction.  Take only at night and not for driving.  Patient instructed to call back if condition worsens or does not improve.    - HYDROcodone-homatropine (HYCODAN) 5-1.5 MG/5ML syrup; Take 5 mLs by mouth every 8 (eight) hours as needed for cough.  Dispense: 120 mL; Refill: 0 - POCT Influenza A/B  2.  Anxiety Stable. Refill medication.   - ALPRAZolam (XANAX) 1 MG tablet; Take 1 tablet (1 mg total) by mouth daily as needed for anxiety.  Dispense: 30 tablet; Refill: [place], MD  [facility]

## 2015-09-05 ENCOUNTER — Telehealth: Payer: Self-pay | Admitting: Family Medicine

## 2015-09-05 MED ORDER — AMOXICILLIN-POT CLAVULANATE 875-125 MG PO TABS: 1.0000 | ORAL_TABLET | Freq: Two times a day (BID) | ORAL | Status: DC

## 2015-09-05 MED ORDER — AMOXICILLIN-POT CLAVULANATE 400-57 MG/5ML PO SUSR: 400.0000 mg | Freq: Two times a day (BID) | ORAL | Status: DC

## 2015-09-05 NOTE — Telephone Encounter (Signed)
Pt reports that she can not swallow big pills so the pharmacist is going to try and give her the liquid form.  She also reports that she has a "Liver Problem" (NASH)  She wanted to make sure it is okay for her to take Augmentin.  She would like a phone call tonight.   Thanks,   -Mickel Baas

## 2015-09-05 NOTE — Addendum Note (Signed)
Addended by: Jerrell Belfast on: 09/05/2015 07:00 PM   Modules accepted: Orders

## 2015-09-05 NOTE — Telephone Encounter (Signed)
Ok to take medication.  thanks

## 2015-09-05 NOTE — Telephone Encounter (Signed)
Pt was in for OV 09/02/15 and she stated that now she is coughing up green phlegm and feels worse. Pt wanted to know what else she could try. Pharmacy: [facility]. [employer]. Please advise. Thanks TNP

## 2015-09-05 NOTE — Telephone Encounter (Signed)
Sent rx to [facility]. Thanks.

## 2015-09-05 NOTE — Telephone Encounter (Signed)
Patient notified. Patient stated that she needs the liquid form.

## 2015-09-07 ENCOUNTER — Ambulatory Visit
Admission: RE | Admit: 2015-09-07 | Discharge: 2015-09-07 | Disposition: A | Discharge status: Home / Self Care | Payer: Medicare Other | Source: Ambulatory Visit | Attending: Family Medicine | Admitting: Family Medicine

## 2015-09-07 ENCOUNTER — Ambulatory Visit (INDEPENDENT_AMBULATORY_CARE_PROVIDER_SITE_OTHER): Payer: Medicare Other | Admitting: Family Medicine

## 2015-09-07 ENCOUNTER — Encounter: Payer: Self-pay | Admitting: Family Medicine

## 2015-09-07 ENCOUNTER — Telehealth: Payer: Self-pay

## 2015-09-07 ENCOUNTER — Telehealth: Payer: Self-pay | Admitting: Family Medicine

## 2015-09-07 VITALS — BP 112/74 | HR 84 | Temp 98.1°F | Resp 20 | Wt 219.0 lb

## 2015-09-07 DIAGNOSIS — R059 Cough, unspecified: Secondary | ICD-10-CM

## 2015-09-07 DIAGNOSIS — R05 Cough: Secondary | ICD-10-CM

## 2015-09-07 DIAGNOSIS — R509 Fever, unspecified: Secondary | ICD-10-CM | POA: Diagnosis not present

## 2015-09-07 DIAGNOSIS — J069 Acute upper respiratory infection, unspecified: Secondary | ICD-10-CM | POA: Diagnosis not present

## 2015-09-07 MED ORDER — ALBUTEROL SULFATE HFA 108 (90 BASE) MCG/ACT IN AERS: 2.0000 | INHALATION_SPRAY | Freq: Four times a day (QID) | RESPIRATORY_TRACT | Status: DC | PRN

## 2015-09-07 MED ORDER — LEVALBUTEROL HCL 1.25 MG/3ML IN NEBU: 1.2500 mg | INHALATION_SOLUTION | Freq: Once | RESPIRATORY_TRACT | Status: DC

## 2015-09-07 MED ORDER — HYDROCODONE-HOMATROPINE 5-1.5 MG/5ML PO SYRP
5.0000 mL | ORAL_SOLUTION | ORAL | Status: DC | PRN
Start: 2015-09-07 — End: 2015-10-14

## 2015-09-07 NOTE — Telephone Encounter (Signed)
-----   Message from Margarita Rana, MD sent at 09/07/2015  1:26 PM EST ----- CXR shows no pneumonia ( for record, not patient info.  Did double check with Dr. Maree Erie.   Unfolded aorta normal for age).  Thanks.

## 2015-09-07 NOTE — Telephone Encounter (Signed)
Pt called back and scheduled the appt for 1045 today. Thanks TNP

## 2015-09-07 NOTE — Telephone Encounter (Signed)
Advised pt of results. Pt verbally acknowledges understanding. Renaldo Fiddler, CMA

## 2015-09-07 NOTE — Telephone Encounter (Signed)
Pt stated that she isn't feeling better and wanted to come in to see Dr. Venia Minks. Pt stated she couldn't get here by 10:45 and wanted to speak to Dr. Venia Minks or a nurse to see if she needs to come back in and if she could be worked in with Dr. Venia Minks. Pt stated she has been taking medication as directed but she thinks her cough is worse and is very fatigue. Thanks TNP

## 2015-09-07 NOTE — Progress Notes (Signed)
Subjective:    Patient ID: Anita Bennett, female    DOB: 03-11-1941, 75 y.o.   MRN: [id number]  HPI  Follow up for cough  The patient was last seen for this 5 days ago. Changes made at last visit include started hycodan and augmentin started on 09/05/2015.  She reports excellent compliance with treatment. She feels that condition is Unchanged. She is not having side effects.  Patient reports that her symptoms have not improved on augmentin. Patient reports cough is painful. ------------------------------------------------------------------------------------ Patient Active Problem List   Diagnosis Date Noted  . Screening-pulmonary TB 05/03/2015  . Memory loss 05/03/2015  . Allergic rhinitis 05/02/2015  . Blood glucose elevated 05/02/2015  . HLD (hyperlipidemia) 05/02/2015  . Benign hypertension 05/02/2015  . Adult hypothyroidism 05/02/2015  . Malaise and fatigue 05/02/2015  . Anxiety, mild 05/02/2015  . Billowing mitral valve 05/02/2015  . NASH (nonalcoholic steatohepatitis) 05/02/2015  . Adiposity 05/02/2015  . Arthritis, degenerative 05/02/2015  . Disorder of peripheral nervous system ([place]) 05/02/2015  . Anxiety 01/27/2015  . Hepatic cirrhosis ([place]) 01/12/2015  . Hammer toe 11/18/2014  . Hallux rigidus of right foot 11/18/2014  . Hallux rigidus of left foot 11/18/2014  . Arthritis of ankle or foot, degenerative 11/18/2014  . Pain in shoulder 10/26/2014  . Peripheral nerve disease ([place]) 10/22/2012  . Arthritis of knee, degenerative 10/16/2011   Past Medical History  Diagnosis Date  . History of toe surgery     12/01/14 [facility]  . H/O knee surgery     2011 [facility] knee (replacement)   Current Outpatient Prescriptions on File Prior to Visit  Medication Sig  . acetaminophen (TYLENOL) 500 MG tablet Take 500 mg by mouth every 6 (six) hours as needed.  . ALPRAZolam (XANAX) 1 MG tablet Take 1 tablet (1 mg total) by mouth daily as needed for anxiety.  . fluticasone (FLONASE)  50 MCG/ACT nasal spray Place 2 sprays into the nose daily.  Marland Kitchen levothyroxine (SYNTHROID, LEVOTHROID) 100 MCG tablet take 1 tablet by mouth once daily  . metoprolol succinate (TOPROL-XL) 25 MG 24 hr tablet Take 1 tablet by mouth daily.  . montelukast (SINGULAIR) 10 MG tablet Take 1 tablet by mouth daily.  Marland Kitchen PARoxetine (PAXIL) 20 MG tablet Take 1 tablet by mouth daily.  . vitamin B-12 (CYANOCOBALAMIN) 1000 MCG tablet Take 1 tablet (1,000 mcg total) by mouth daily.   No current facility-administered medications on file prior to visit.   Allergies  Allergen Reactions  . Epinephrine     Heart race  . Promethazine Hcl     hyperactivity   No past surgical history on file. Social History   Social History  . Marital Status: Married    Spouse Name: N/A  . Number of Children: N/A  . Years of Education: N/A   Occupational History  . Not on file.   Social History Main Topics  . Smoking status: Never Smoker   . Smokeless tobacco: Never Used  . Alcohol Use: No  . Drug Use: No  . Sexual Activity: Not on file   Other Topics Concern  . Not on file   Social History Narrative   No family history on file.   Review of Systems  Constitutional: Positive for diaphoresis and appetite change.  HENT: Positive for congestion, ear pain, postnasal drip, rhinorrhea and sinus pressure.   Respiratory: Positive for cough, shortness of breath and wheezing.   Neurological: Positive for weakness.      Blood pressure 112/74, pulse  84, temperature 98.1 F (36.7 C), temperature source Oral, resp. rate 20, weight 219 lb (99.338 kg), SpO2 95 %.  Objective:   Physical Exam  Constitutional: She appears well-developed and well-nourished.  HENT:  Head: Normocephalic and atraumatic.  Right Ear: External ear normal.  Left Ear: External ear normal.  Nose: Nose normal.  Mouth/Throat: Oropharynx is clear and moist.  Cardiovascular: Normal rate, regular rhythm and normal heart sounds.   Pulmonary/Chest: She  has wheezes.       Assessment & Plan:  1. Cough Suspect component of RAD.  Did improve with Xopenex. Will write for inhaler, hold of on prednisone as patient has trouble sleeping on it, check CXR and further plan pending these results.   - DG Chest 2 View; Future - levalbuterol (XOPENEX) nebulizer solution 1.25 mg; Take 1.25 mg by nebulization once. - albuterol (PROVENTIL HFA;VENTOLIN HFA) 108 (90 Base) MCG/ACT inhaler; Inhale 2 puffs into the lungs every 6 (six) hours as needed for wheezing or shortness of breath.  Dispense: 1 Inhaler; Refill: 2  2. Upper respiratory infection Finish antibiotic and add cough syrup.   - HYDROcodone-homatropine (HYCODAN) 5-1.5 MG/5ML syrup; Take 5 mLs by mouth every 4 (four) hours as needed for cough.  Dispense: 120 mL; Refill: 0  Patient was seen and examined by Jerrell Belfast, MD, and note scribed by Lynford Humphrey, [place].   I have reviewed the document for accuracy and completeness and I agree with above. Jerrell Belfast, MD  Margarita Rana, MD

## 2015-09-28 DIAGNOSIS — K7460 Unspecified cirrhosis of liver: Secondary | ICD-10-CM | POA: Diagnosis not present

## 2015-09-28 DIAGNOSIS — Z79899 Other long term (current) drug therapy: Secondary | ICD-10-CM | POA: Diagnosis not present

## 2015-09-28 DIAGNOSIS — E785 Hyperlipidemia, unspecified: Secondary | ICD-10-CM | POA: Diagnosis not present

## 2015-09-28 DIAGNOSIS — K7581 Nonalcoholic steatohepatitis (NASH): Secondary | ICD-10-CM | POA: Diagnosis not present

## 2015-09-28 DIAGNOSIS — E669 Obesity, unspecified: Secondary | ICD-10-CM | POA: Diagnosis not present

## 2015-09-28 DIAGNOSIS — K7290 Hepatic failure, unspecified without coma: Secondary | ICD-10-CM | POA: Diagnosis not present

## 2015-09-28 DIAGNOSIS — E039 Hypothyroidism, unspecified: Secondary | ICD-10-CM | POA: Diagnosis not present

## 2015-09-29 DIAGNOSIS — M5136 Other intervertebral disc degeneration, lumbar region: Secondary | ICD-10-CM | POA: Diagnosis not present

## 2015-09-29 DIAGNOSIS — M47816 Spondylosis without myelopathy or radiculopathy, lumbar region: Secondary | ICD-10-CM | POA: Diagnosis not present

## 2015-09-29 DIAGNOSIS — G8929 Other chronic pain: Secondary | ICD-10-CM | POA: Diagnosis not present

## 2015-09-29 DIAGNOSIS — M25551 Pain in right hip: Secondary | ICD-10-CM | POA: Diagnosis not present

## 2015-10-10 DIAGNOSIS — Z8719 Personal history of other diseases of the digestive system: Secondary | ICD-10-CM | POA: Diagnosis not present

## 2015-10-10 DIAGNOSIS — E669 Obesity, unspecified: Secondary | ICD-10-CM | POA: Diagnosis not present

## 2015-10-10 DIAGNOSIS — Z6835 Body mass index (BMI) 35.0-35.9, adult: Secondary | ICD-10-CM | POA: Diagnosis not present

## 2015-10-10 DIAGNOSIS — Z79899 Other long term (current) drug therapy: Secondary | ICD-10-CM | POA: Diagnosis not present

## 2015-10-10 DIAGNOSIS — Z7951 Long term (current) use of inhaled steroids: Secondary | ICD-10-CM | POA: Diagnosis not present

## 2015-10-10 DIAGNOSIS — K3189 Other diseases of stomach and duodenum: Secondary | ICD-10-CM | POA: Diagnosis not present

## 2015-10-10 DIAGNOSIS — K2950 Unspecified chronic gastritis without bleeding: Secondary | ICD-10-CM | POA: Diagnosis not present

## 2015-10-10 DIAGNOSIS — K7581 Nonalcoholic steatohepatitis (NASH): Secondary | ICD-10-CM | POA: Diagnosis not present

## 2015-10-10 DIAGNOSIS — K219 Gastro-esophageal reflux disease without esophagitis: Secondary | ICD-10-CM | POA: Diagnosis not present

## 2015-10-10 DIAGNOSIS — Z13810 Encounter for screening for upper gastrointestinal disorder: Secondary | ICD-10-CM | POA: Diagnosis not present

## 2015-10-10 DIAGNOSIS — I8500 Esophageal varices without bleeding: Secondary | ICD-10-CM | POA: Diagnosis not present

## 2015-10-10 DIAGNOSIS — K7460 Unspecified cirrhosis of liver: Secondary | ICD-10-CM | POA: Diagnosis not present

## 2015-10-10 DIAGNOSIS — I10 Essential (primary) hypertension: Secondary | ICD-10-CM | POA: Diagnosis not present

## 2015-10-13 ENCOUNTER — Telehealth: Payer: Self-pay | Admitting: Family Medicine

## 2015-10-14 ENCOUNTER — Ambulatory Visit (INDEPENDENT_AMBULATORY_CARE_PROVIDER_SITE_OTHER): Payer: Medicare Other | Admitting: Family Medicine

## 2015-10-14 ENCOUNTER — Other Ambulatory Visit: Payer: Self-pay

## 2015-10-14 ENCOUNTER — Encounter: Payer: Self-pay | Admitting: Family Medicine

## 2015-10-14 VITALS — BP 136/72 | HR 76 | Temp 98.4°F | Resp 16 | Wt 224.0 lb

## 2015-10-14 DIAGNOSIS — E038 Other specified hypothyroidism: Secondary | ICD-10-CM | POA: Diagnosis not present

## 2015-10-14 NOTE — Progress Notes (Signed)
Subjective:    Patient ID: Anita Bennett, female    DOB: 01-12-1941, 75 y.o.   MRN: [id number]  HPI  Hypothyroidism Anita Bennett is a 75 y.o. female who presents for follow up of hypothyroidism. Current symptoms: none . Patient denies change in energy level, diarrhea, heat / cold intolerance, nervousness and palpitations. Symptoms have been well-controlled. Pt currently taking Levothyroxine 100 mcg. Last TSH was checked by [facility] on 09/28/2015 and was 0.25, T4 was 1.05. [facility] advised pt to FU with PCP regarding recent TSH.  Review of Systems  Constitutional: Negative for fever, chills, diaphoresis, activity change, appetite change, fatigue and unexpected weight change.  Endocrine: Negative for cold intolerance and heat intolerance.   BP 136/72 mmHg  Pulse 76  Temp(Src) 98.4 F (36.9 C) (Oral)  Resp 16  Wt 224 lb (101.606 kg)   Patient Active Problem List   Diagnosis Date Noted  . Screening-pulmonary TB 05/03/2015  . Memory loss 05/03/2015  . Allergic rhinitis 05/02/2015  . Blood glucose elevated 05/02/2015  . HLD (hyperlipidemia) 05/02/2015  . Benign hypertension 05/02/2015  . Adult hypothyroidism 05/02/2015  . Malaise and fatigue 05/02/2015  . Anxiety, mild 05/02/2015  . Billowing mitral valve 05/02/2015  . NASH (nonalcoholic steatohepatitis) 05/02/2015  . Adiposity 05/02/2015  . Arthritis, degenerative 05/02/2015  . Disorder of peripheral nervous system ([place]) 05/02/2015  . Anxiety 01/27/2015  . Hepatic cirrhosis ([place]) 01/12/2015  . Hammer toe 11/18/2014  . Hallux rigidus of right foot 11/18/2014  . Hallux rigidus of left foot 11/18/2014  . Arthritis of ankle or foot, degenerative 11/18/2014  . Pain in shoulder 10/26/2014  . Peripheral nerve disease ([place]) 10/22/2012  . Arthritis of knee, degenerative 10/16/2011   Past Medical History  Diagnosis Date  . History of toe surgery     12/01/14 [facility]  . H/O knee surgery     2011 [facility] knee (replacement)   Current  Outpatient Prescriptions on File Prior to Visit  Medication Sig  . acetaminophen (TYLENOL) 500 MG tablet Take 500 mg by mouth every 6 (six) hours as needed.  . ALPRAZolam (XANAX) 1 MG tablet Take 1 tablet (1 mg total) by mouth daily as needed for anxiety.  . fluticasone (FLONASE) 50 MCG/ACT nasal spray Place 2 sprays into the nose daily.  Marland Kitchen levothyroxine (SYNTHROID, LEVOTHROID) 100 MCG tablet take 1 tablet by mouth once daily  . metoprolol succinate (TOPROL-XL) 25 MG 24 hr tablet Take 1 tablet by mouth daily.  Marland Kitchen PARoxetine (PAXIL) 20 MG tablet Take 1 tablet by mouth daily.  . vitamin B-12 (CYANOCOBALAMIN) 1000 MCG tablet Take 1 tablet (1,000 mcg total) by mouth daily.   Current Facility-Administered Medications on File Prior to Visit  Medication  . levalbuterol (XOPENEX) nebulizer solution 1.25 mg   Allergies  Allergen Reactions  . Epinephrine     Heart race  . Promethazine Hcl     hyperactivity   No past surgical history on file. Social History   Social History  . Marital Status: Married    Spouse Name: N/A  . Number of Children: N/A  . Years of Education: N/A   Occupational History  . Not on file.   Social History Main Topics  . Smoking status: Never Smoker   . Smokeless tobacco: Never Used  . Alcohol Use: No  . Drug Use: No  . Sexual Activity: Not on file   Other Topics Concern  . Not on file   Social History Narrative   No family  history on file.     Objective:   Physical Exam  Constitutional: She appears well-developed and well-nourished.  Cardiovascular: Normal rate, regular rhythm and normal heart sounds.   Pulmonary/Chest: Effort normal and breath sounds normal. No respiratory distress.  Psychiatric: She has a normal mood and affect. Her behavior is normal.  BP 136/72 mmHg  Pulse 76  Temp(Src) 98.4 F (36.9 C) (Oral)  Resp 16  Wt 224 lb (101.606 kg)     Assessment & Plan:  1. Other specified hypothyroidism Stable. Pt asymptomatic. Will not make  changes at this time. Will recheck thyroid panel in 6 weeks. FU pending results.  Patient seen and examined by Jerrell Belfast, MD, and note scribed by Renaldo Fiddler, CMA.  I have reviewed the document for accuracy and completeness and I agree with above. Jerrell Belfast, MD   Margarita Rana, MD

## 2015-10-18 ENCOUNTER — Other Ambulatory Visit: Payer: Self-pay | Admitting: Family Medicine

## 2015-10-18 DIAGNOSIS — I10 Essential (primary) hypertension: Secondary | ICD-10-CM

## 2015-10-18 NOTE — Telephone Encounter (Signed)
LOV 10/14/2015. Renaldo Fiddler, CMA

## 2015-10-26 DIAGNOSIS — M25551 Pain in right hip: Secondary | ICD-10-CM | POA: Diagnosis not present

## 2015-10-26 DIAGNOSIS — G8929 Other chronic pain: Secondary | ICD-10-CM | POA: Diagnosis not present

## 2015-10-27 ENCOUNTER — Encounter: Payer: Self-pay | Admitting: Family Medicine

## 2015-10-30 DIAGNOSIS — K7581 Nonalcoholic steatohepatitis (NASH): Secondary | ICD-10-CM | POA: Diagnosis not present

## 2015-10-30 DIAGNOSIS — I864 Gastric varices: Secondary | ICD-10-CM | POA: Diagnosis not present

## 2015-10-30 DIAGNOSIS — I8500 Esophageal varices without bleeding: Secondary | ICD-10-CM | POA: Diagnosis not present

## 2015-10-30 DIAGNOSIS — K7460 Unspecified cirrhosis of liver: Secondary | ICD-10-CM | POA: Diagnosis not present

## 2015-10-30 DIAGNOSIS — K766 Portal hypertension: Secondary | ICD-10-CM | POA: Diagnosis not present

## 2015-11-20 ENCOUNTER — Other Ambulatory Visit: Payer: Self-pay | Admitting: Family Medicine

## 2015-11-20 DIAGNOSIS — F419 Anxiety disorder, unspecified: Secondary | ICD-10-CM

## 2015-12-02 ENCOUNTER — Encounter: Payer: Self-pay | Admitting: Family Medicine

## 2015-12-02 ENCOUNTER — Ambulatory Visit (INDEPENDENT_AMBULATORY_CARE_PROVIDER_SITE_OTHER): Payer: Medicare Other | Admitting: Family Medicine

## 2015-12-02 VITALS — BP 134/82 | HR 68 | Temp 98.0°F | Resp 16 | Wt 226.0 lb

## 2015-12-02 DIAGNOSIS — R413 Other amnesia: Secondary | ICD-10-CM

## 2015-12-02 DIAGNOSIS — E038 Other specified hypothyroidism: Secondary | ICD-10-CM

## 2015-12-02 DIAGNOSIS — M545 Low back pain: Secondary | ICD-10-CM | POA: Diagnosis not present

## 2015-12-02 DIAGNOSIS — R350 Frequency of micturition: Secondary | ICD-10-CM | POA: Diagnosis not present

## 2015-12-02 DIAGNOSIS — R5383 Other fatigue: Secondary | ICD-10-CM

## 2015-12-02 DIAGNOSIS — R0602 Shortness of breath: Secondary | ICD-10-CM | POA: Diagnosis not present

## 2015-12-02 NOTE — Progress Notes (Signed)
Subjective:    Patient ID: Anita Bennett, female    DOB: [birth year], 75 y.o.   MRN: [id number]  HPI  Fatigue: Patient complains of fatigue. Symptoms began "a while" ago. Sentinal symptom the patient feels fatigue began with: significant change in weight, symptoms of arthritis, exercise intolerance and GI blood loss (bright red blood in stools; pt believes this is secondary to hemorrhoids. Pt does have internal hemorrhoids.) Symptoms of her fatigue have been anxiousness, general malaise, headaches and poor athletic performance. Patient denies witnessed or suspected sleep apnea. Symptoms have gradually worsened. Severity has been struggles to carry out day to day responsibilities. (due to poor exercise tolerance, arthralgias and poor posture.)  Also has a lot of other concerns regarding her health. Here with her daughter.  Is repeating herself some during the visit.  Has stopped the lactulose for now.   Does not think she needs it.  Is not currently taking her B12.   Her thyroid was overcorrected when checked by at [facility].  Does feel some increased anxiety also. Does get lost sometimes when driving.   Does not go out and do things as much. Does not feel depressed.  Just does not want to do much.    Review of Systems  Constitutional: Positive for activity change and fatigue. Negative for fever and appetite change.  Respiratory: Positive for shortness of breath (on exertion).   Musculoskeletal: Positive for arthralgias (right hip pain).  Neurological: Positive for headaches (2 migraines in 2 days).  Psychiatric/Behavioral: Positive for decreased concentration. The patient is nervous/anxious.    BP 134/82 mmHg  Pulse 68  Temp(Src) 98 F (36.7 C) (Oral)  Resp 16  Wt 226 lb (102.513 kg)    Past Medical History  Diagnosis Date  . History of toe surgery     12/01/14 [facility]  . H/O knee surgery     [facility] (replacement)   Current Outpatient Prescriptions on File Prior to Visit    Medication Sig  . acetaminophen (TYLENOL) 500 MG tablet Take 500 mg by mouth every 6 (six) hours as needed.  . ALPRAZolam (XANAX) 1 MG tablet Take 1 tablet (1 mg total) by mouth daily as needed for anxiety.  Marland Kitchen levothyroxine (SYNTHROID, LEVOTHROID) 100 MCG tablet take 1 tablet by mouth once daily  . metoprolol succinate (TOPROL-XL) 25 MG 24 hr tablet take 1 tablet by mouth once daily  . PARoxetine (PAXIL) 20 MG tablet take 1 tablet by mouth once daily  . fluticasone (FLONASE) 50 MCG/ACT nasal spray Place 2 sprays into the nose daily. Reported on 12/02/2015  . lactulose (CHRONULAC) 10 GM/15ML solution Reported on 12/02/2015  . vitamin B-12 (CYANOCOBALAMIN) 1000 MCG tablet Take 1 tablet (1,000 mcg total) by mouth daily. (Patient not taking: Reported on 12/02/2015)   Current Facility-Administered Medications on File Prior to Visit  Medication  . levalbuterol (XOPENEX) nebulizer solution 1.25 mg   Allergies  Allergen Reactions  . Epinephrine     Heart race  . No Known Allergies   . Promethazine Hcl     hyperactivity   No past surgical history on file. Social History   Social History  . Marital Status: Married    Spouse Name: N/A  . Number of Children: N/A  . Years of Education: N/A   Occupational History  . Not on file.   Social History Main Topics  . Smoking status: Never Smoker   . Smokeless tobacco: Never Used  . Alcohol Use: No  . Drug  Use: No  . Sexual Activity: Not on file   Other Topics Concern  . Not on file   Social History Narrative   No family history on file.     Objective:   Physical Exam  Constitutional: She is oriented to person, place, and time. She appears well-developed and well-nourished.  Cardiovascular: Normal rate and regular rhythm.   Murmur heard. Pulmonary/Chest: Effort normal and breath sounds normal.  Neurological: She is alert and oriented to person, place, and time.  Psychiatric: She has a normal mood and affect. Her behavior is normal.  Judgment and thought content normal.  BP 134/82 mmHg  Pulse 68  Temp(Src) 98 F (36.7 C) (Oral)  Resp 16  Wt 226 lb (102.513 kg)      Assessment & Plan:  1. Low back pain, unspecified back pain laterality, with sciatica presence unspecified Daughter is concerned this may cause falls. Will refer to PT for gait training. - Ambulatory referral to Physical Therapy  2. SOBOE (shortness of breath on exertion) Worsening. H/O murmur. Will refer to Dr. Rockey Situ as below. - Ambulatory referral to Cardiology  3. Other specified hypothyroidism Will recheck these levels today. - Thyroid Panel With TSH  4. Other fatigue Will check CBC to R/O anemia. - CBC with Differential/Platelet  5. Memory loss Worsening. Pt admits she is not taking her B12 regularly. Advised pt to take B12 daily. Refer to neurology as below. - Vitamin B12 - Ambulatory referral to Neurology   6. Urinary frequency New problem. Pt to give urine sample at [employer] when she has blood drawn. FU pending results. - Urinalysis, Routine w reflex microscopic (not at [facility]) - Urine culture   Patient seen and examined by Jerrell Belfast, MD, and note scribed by Renaldo Fiddler, CMA.   I have reviewed the document for accuracy and completeness and I agree with above. Jerrell Belfast, MD   Margarita Rana, MD

## 2015-12-04 LAB — CBC WITH DIFFERENTIAL/PLATELET
BASOS: 2 %
Basophils Absolute: 0.1 10*3/uL (ref 0.0–0.2)
EOS (ABSOLUTE): 0.2 10*3/uL (ref 0.0–0.4)
EOS: 3 %
HEMATOCRIT: 39 % (ref 34.0–46.6)
HEMOGLOBIN: 12.4 g/dL (ref 11.1–15.9)
Immature Grans (Abs): 0 10*3/uL (ref 0.0–0.1)
Immature Granulocytes: 0 %
LYMPHS ABS: 2.8 10*3/uL (ref 0.7–3.1)
Lymphs: 42 %
MCH: 28.3 pg (ref 26.6–33.0)
MCHC: 31.8 g/dL (ref 31.5–35.7)
MCV: 89 fL (ref 79–97)
MONOCYTES: 7 %
MONOS ABS: 0.5 10*3/uL (ref 0.1–0.9)
NEUTROS ABS: 3 10*3/uL (ref 1.4–7.0)
Neutrophils: 46 %
Platelets: 284 10*3/uL (ref 150–379)
RBC: 4.38 x10E6/uL (ref 3.77–5.28)
RDW: 15.2 % (ref 12.3–15.4)
WBC: 6.6 10*3/uL (ref 3.4–10.8)

## 2015-12-04 LAB — THYROID PANEL WITH TSH
Free Thyroxine Index: 2.5 (ref 1.2–4.9)
T3 UPTAKE RATIO: 22 % — AB (ref 24–39)
T4 TOTAL: 11.2 ug/dL (ref 4.5–12.0)
TSH: 1.11 u[IU]/mL (ref 0.450–4.500)

## 2015-12-04 LAB — URINALYSIS, ROUTINE W REFLEX MICROSCOPIC

## 2015-12-04 LAB — VITAMIN B12: VITAMIN B 12: 262 pg/mL (ref 211–946)

## 2015-12-04 LAB — URINE CULTURE

## 2015-12-05 ENCOUNTER — Other Ambulatory Visit: Payer: Self-pay

## 2015-12-05 DIAGNOSIS — R350 Frequency of micturition: Secondary | ICD-10-CM | POA: Diagnosis not present

## 2015-12-06 ENCOUNTER — Encounter: Payer: Self-pay | Admitting: Cardiovascular Disease

## 2015-12-06 ENCOUNTER — Ambulatory Visit (INDEPENDENT_AMBULATORY_CARE_PROVIDER_SITE_OTHER): Payer: Medicare Other | Admitting: Cardiovascular Disease

## 2015-12-06 VITALS — BP 145/82 | HR 67 | Ht 66.0 in | Wt 224.2 lb

## 2015-12-06 DIAGNOSIS — R011 Cardiac murmur, unspecified: Secondary | ICD-10-CM | POA: Diagnosis not present

## 2015-12-06 DIAGNOSIS — N393 Stress incontinence (female) (male): Secondary | ICD-10-CM

## 2015-12-06 DIAGNOSIS — R32 Unspecified urinary incontinence: Secondary | ICD-10-CM

## 2015-12-06 DIAGNOSIS — E785 Hyperlipidemia, unspecified: Secondary | ICD-10-CM

## 2015-12-06 DIAGNOSIS — R2681 Unsteadiness on feet: Secondary | ICD-10-CM | POA: Insufficient documentation

## 2015-12-06 DIAGNOSIS — I358 Other nonrheumatic aortic valve disorders: Secondary | ICD-10-CM

## 2015-12-06 DIAGNOSIS — K7460 Unspecified cirrhosis of liver: Secondary | ICD-10-CM

## 2015-12-06 DIAGNOSIS — R413 Other amnesia: Secondary | ICD-10-CM

## 2015-12-06 DIAGNOSIS — R0602 Shortness of breath: Secondary | ICD-10-CM | POA: Insufficient documentation

## 2015-12-06 DIAGNOSIS — N3946 Mixed incontinence: Secondary | ICD-10-CM | POA: Insufficient documentation

## 2015-12-06 NOTE — Assessment & Plan Note (Addendum)
Significant gait instability, leg weakness. This was noted even getting up and down from the exam table We have recommended regular exercise program. She has suggested that she could join the [facility] for exercise

## 2015-12-06 NOTE — Progress Notes (Signed)
Patient ID: Anita Bennett, female    DOB: 02/15/1941, 75 y.o.   MRN: [id number]  HPI Comments: Anita Bennett is a pleasant 75 year old woman with history of migraines, incontinence, cirrhosis, memory issues, obesity who presents by referral from Dr. Venia Minks for consultation of heart murmur.  She reports having long-standing history of murmur, not new for her She does not have a regular exercise program, finds her legs and balance are getting weaker She is currently in the process of getting ready to move in with her daughter. Wonders if it is a good idea.  Son reports having problems with her memory, difficulty driving. Find herself on the toilet at 2 in the morning, does not remember getting there. Reports going to bed very late, falls asleep in the recliner, wakes up early morning, confused. "Did I go to bed or not"  Reports having significant migraines with associated aura. Difficulty with incontinence. Bathroom is very close to her bed but still unable to make it to the bathroom, has to wear depends undergarment. She does drink significant soda daytime and nighttime. She is requesting referral to [facility] for incontinence evaluation.  She does not check her blood pressure at home, reports typically it runs okay Has a problem with balance, tends to walk leaning over, uses a shopping cart in the grocery store to lean on Balance is poor  Lab work reviewed with her from March 2017 showing total cholesterol 197, LDL 124.  EKG on today's visit shows normal sinus rhythm with rate 67 bpm, no significant ST or T-wave changes   Allergies  Allergen Reactions  . Epinephrine     Heart race  . No Known Allergies   . Promethazine Hcl     hyperactivity  . Statins     Current Outpatient Prescriptions on File Prior to Visit  Medication Sig Dispense Refill  . acetaminophen (TYLENOL) 500 MG tablet Take 500 mg by mouth every 6 (six) hours as needed.    . ALPRAZolam (XANAX) 1 MG tablet Take 1  tablet (1 mg total) by mouth daily as needed for anxiety. 30 tablet 5  . lactulose (CHRONULAC) 10 GM/15ML solution Reported on 12/02/2015  0  . levothyroxine (SYNTHROID, LEVOTHROID) 100 MCG tablet take 1 tablet by mouth once daily 90 tablet 1  . metoprolol succinate (TOPROL-XL) 25 MG 24 hr tablet take 1 tablet by mouth once daily 90 tablet 3  . PARoxetine (PAXIL) 20 MG tablet take 1 tablet by mouth once daily 90 tablet 1  . vitamin B-12 (CYANOCOBALAMIN) 1000 MCG tablet Take 1 tablet (1,000 mcg total) by mouth daily. 1 tablet 0   Current Facility-Administered Medications on File Prior to Visit  Medication Dose Route Frequency Provider Last Rate Last Dose  . levalbuterol (XOPENEX) nebulizer solution 1.25 mg  1.25 mg Nebulization Once Margarita Rana, MD        Past Medical History  Diagnosis Date  . History of toe surgery     12/01/14 [facility]  . H/O knee surgery     [facility] (replacement)  . Heart murmur   . Arrhythmia   . Hypertension   . Hyperlipidemia   . History of kidney stones   . Thyroid disease   . H/O cirrhosis     Past Surgical History  Procedure Laterality Date  . Back surgery    . Total knee arthroplasty Bilateral   . Total abdominal hysterectomy    . Arm surgery    . Toe surgery    .  Cholecystectomy      Social History  reports that she has never smoked. She has never used smokeless tobacco. She reports that she does not drink alcohol or use illicit drugs.  Family History family history includes Stroke in her mother.   Review of Systems  Constitutional: Negative.   Respiratory: Negative.   Cardiovascular: Negative.   Gastrointestinal: Negative.   Musculoskeletal: Positive for gait problem.  Neurological: Negative.   Hematological: Negative.   Psychiatric/Behavioral: Positive for sleep disturbance.       Problems with memory  All other systems reviewed and are negative.   BP 164/82 mmHg  Pulse 67  Ht 5' 6"  (1.676 m)  Wt 224 lb 4 oz (101.719 kg)   BMI 36.21 kg/m2 Repeat blood pressure by myself systolic pressure 071/21 Physical Exam  Constitutional: She is oriented to person, place, and time. She appears well-developed and well-nourished.  Obese  HENT:  Head: Normocephalic.  Nose: Nose normal.  Mouth/Throat: Oropharynx is clear and moist.  Eyes: Conjunctivae are normal. Pupils are equal, round, and reactive to light.  Neck: Normal range of motion. Neck supple. No JVD present.  Cardiovascular: Normal rate, regular rhythm and intact distal pulses.  Exam reveals no gallop and no friction rub.   Murmur heard.  Systolic murmur is present with a grade of 2/6  Pulmonary/Chest: Effort normal and breath sounds normal. No respiratory distress. She has no wheezes. She has no rales. She exhibits no tenderness.  Abdominal: Soft. Bowel sounds are normal. She exhibits no distension. There is no tenderness.  Musculoskeletal: Normal range of motion. She exhibits no edema or tenderness.  Lymphadenopathy:    She has no cervical adenopathy.  Neurological: She is alert and oriented to person, place, and time. Coordination normal.  Skin: Skin is warm and dry. No rash noted. No erythema.  Psychiatric: She has a normal mood and affect. Her behavior is normal. Judgment and thought content normal.

## 2015-12-06 NOTE — Patient Instructions (Addendum)
No medication changes were made.  Try to go to bed early, no later than 10 pm Try to cut back on your soda after 7 pm to avoid accidents at night For memory, start [employer] exercise program  For murmur and shortness of breath, we will schedule an echocardiogram in a few weeks, (Monday, Tuesday or Friday) This can also help with workup for liver cirrhosis (to evaluate right heart pressures) You likely have aortic valve sclerosis with stenosis (early stage aortic valve disease) Echocardiography is a painless test that uses sound waves to create images of your heart. It provides your doctor with information about the size and shape of your heart and how well your heart's chambers and valves are working. This procedure takes approximately one hour. There are no restrictions for this procedure.   Date & time: _________________________________  Keep an eye on the blood pressure Mildly elevated today, but was ok with Dr. Venia Minks on two previous visits  I believe you are low risk for coronary disease, BUT If you ever want to screen for coronary disease, Call the office, We would order a CT coronary calcium score, $150 Done in [place]  Please call us if you have new issues that need to be addressed before your next appt.   Echocardiogram An echocardiogram, or echocardiography, uses sound waves (ultrasound) to produce an image of your heart. The echocardiogram is simple, painless, obtained within a short period of time, and offers valuable information to your health care provider. The images from an echocardiogram can provide information such as:  Evidence of coronary artery disease (CAD).  Heart size.  Heart muscle function.  Heart valve function.  Aneurysm detection.  Evidence of a past heart attack.  Fluid buildup around the heart.  Heart muscle thickening.  Assess heart valve function. LET [facility] CARE PROVIDER KNOW ABOUT:  Any allergies you have.  All medicines you are taking,  including vitamins, herbs, eye drops, creams, and over-the-counter medicines.  Previous problems you or members of your family have had with the use of anesthetics.  Any blood disorders you have.  Previous surgeries you have had.  Medical conditions you have.  Possibility of pregnancy, if this applies. BEFORE THE PROCEDURE  No special preparation is needed. Eat and drink normally.  PROCEDURE   In order to produce an image of your heart, gel will be applied to your chest and a wand-like tool (transducer) will be moved over your chest. The gel will help transmit the sound waves from the transducer. The sound waves will harmlessly bounce off your heart to allow the heart images to be captured in real-time motion. These images will then be recorded.  You may need an IV to receive a medicine that improves the quality of the pictures. AFTER THE PROCEDURE You may return to your normal schedule including diet, activities, and medicines, unless your health care provider tells you otherwise.   This information is not intended to replace advice given to you by your health care provider. Make sure you discuss any questions you have with your health care provider.   Document Released: 07/06/2000 Document Revised: 07/30/2014 Document Reviewed: 03/16/2013 Elsevier Interactive Patient Education [employer].

## 2015-12-06 NOTE — Assessment & Plan Note (Signed)
Low-grade murmur concerning for aortic valve sclerosis Echocardiogram has been ordered

## 2015-12-06 NOTE — Assessment & Plan Note (Signed)
She reports having accidents overnight. Recommended that she cut back on her soda after dinner in an effort to minimize accidents She is requesting referral to urology, suggested we defer to Dr. Venia Minks. She wanted to see [facility] Oh

## 2015-12-06 NOTE — Assessment & Plan Note (Signed)
Etiology of her cirrhosis is unclear Grade 1 varices also noted (records reviewed) Previously on lactulose. Some confusion on today's visit, though this is more consistent with early dementia. Echocardiogram has been ordered to estimate right heart pressures and exclude hepatic congestion

## 2015-12-06 NOTE — Assessment & Plan Note (Signed)
Total cholesterol in the 190 range Currently not on a statin. We'll hold off given her cirrhosis

## 2015-12-06 NOTE — Assessment & Plan Note (Signed)
She reports having shortness of breath on exertion.  Likely secondary to obesity, deconditioning, gait instability. Echocardiogram pending. We have recommended regular exercise program for conditioning   Total encounter time more than 45 minutes  Greater than 50% was spent in counseling and coordination of care with the patient

## 2015-12-06 NOTE — Assessment & Plan Note (Signed)
Long discussion concerning her memory loss. She is concerned as she would find herself on the toilet and did not remember how she got there.  Poor sleep hygiene. Recommended she try to go to bed earlier.

## 2015-12-07 ENCOUNTER — Telehealth: Payer: Self-pay

## 2015-12-07 DIAGNOSIS — R32 Unspecified urinary incontinence: Secondary | ICD-10-CM

## 2015-12-07 LAB — MICROSCOPIC EXAMINATION: Casts: NONE SEEN /lpf

## 2015-12-07 LAB — URINALYSIS, ROUTINE W REFLEX MICROSCOPIC
Bilirubin, UA: NEGATIVE
Glucose, UA: NEGATIVE
KETONES UA: NEGATIVE
Nitrite, UA: NEGATIVE
PROTEIN UA: NEGATIVE
RBC, UA: NEGATIVE
SPEC GRAV UA: 1.014 (ref 1.005–1.030)
Urobilinogen, Ur: 0.2 mg/dL (ref 0.2–1.0)
pH, UA: 6.5 (ref 5.0–7.5)

## 2015-12-07 LAB — URINE CULTURE

## 2015-12-07 NOTE — Telephone Encounter (Signed)
-----   Message from Margarita Rana, MD sent at 12/07/2015  7:19 AM EDT ----- Urine stable.  No infection. Please see how patient is doing.  Thanks.

## 2015-12-07 NOTE — Telephone Encounter (Signed)
Can refer to urology for incontinence assessment if patient would like. Thanks.

## 2015-12-07 NOTE — Telephone Encounter (Signed)
LMTCB 12/07/2015 Renaldo Fiddler, CMA

## 2015-12-07 NOTE — Telephone Encounter (Signed)
Patient advised as directed below. Patient reports she is doing fine, but still no control. No pain.   Patient wanted to let you know she seen Dr. Rockey Situ yesterday and she "loves" him. She is still waiting on report.

## 2015-12-12 ENCOUNTER — Telehealth: Payer: Self-pay

## 2015-12-12 DIAGNOSIS — R32 Unspecified urinary incontinence: Secondary | ICD-10-CM

## 2015-12-26 ENCOUNTER — Ambulatory Visit (INDEPENDENT_AMBULATORY_CARE_PROVIDER_SITE_OTHER): Payer: Medicare Other

## 2015-12-26 ENCOUNTER — Ambulatory Visit: Payer: Medicare Other | Attending: Family Medicine | Admitting: Physical Therapy

## 2015-12-26 ENCOUNTER — Other Ambulatory Visit: Payer: Self-pay

## 2015-12-26 ENCOUNTER — Encounter: Payer: Self-pay | Admitting: Physical Therapy

## 2015-12-26 DIAGNOSIS — M5442 Lumbago with sciatica, left side: Secondary | ICD-10-CM | POA: Diagnosis not present

## 2015-12-26 DIAGNOSIS — R011 Cardiac murmur, unspecified: Secondary | ICD-10-CM | POA: Diagnosis not present

## 2015-12-26 NOTE — Therapy (Signed)
[place] MAIN [facility] SERVICES [address] [place], [place], [address] Phone: [phone]   Fax:  [phone]  Physical Therapy Evaluation  Patient Details  Name: Anita Bennett MRN: [id number] Date of Birth: 11/03/1940 Referring Provider: Margarita Rana   Encounter Date: 12/26/2015      PT End of Session - 12/26/15 1650    Visit Number 1   Number of Visits 17   Date for PT Re-Evaluation 02/27/16   Equipment Utilized During Treatment Gait belt   Activity Tolerance Patient tolerated treatment well   Behavior During Therapy [facility] for tasks assessed/performed      Past Medical History  Diagnosis Date  . History of toe surgery     12/01/14 [facility]  . H/O knee surgery     [facility] (replacement)  . Heart murmur   . Arrhythmia   . Hypertension   . Hyperlipidemia   . History of kidney stones   . Thyroid disease   . H/O cirrhosis     Past Surgical History  Procedure Laterality Date  . Back surgery    . Total knee arthroplasty Bilateral   . Total abdominal hysterectomy    . Arm surgery    . Toe surgery    . Cholecystectomy      There were no vitals filed for this visit.       Subjective Assessment - 12/26/15 1639    Subjective Patient says that she is walking bent over and forward and has back pain.    Currently in Pain? Yes   Pain Score 5    Pain Location Back   Pain Descriptors / Indicators Aching   Pain Radiating Towards right hip   Pain Onset Other (comment)  chronic pain and 3 back surgeries   Pain Frequency Constant          PAIN: low back pain 5/10 , hip pain increases with movement 8/10   POSTURE: flexed posture with ambulation and weight shifting laterally    PROM/AROM:  STRENGTH:  Graded on a 0-5 scale Muscle Group Left Right  Shoulder flex    Shoulder Abd    Shoulder Ext    Shoulder IR/ER    Elbow    Wrist/hand    Hip Flex 2 3  Hip Abd 3 3+  Hip Add 2 2  Hip Ext 2 2  Hip IR/ER 3 3  Knee  Flex 5 5  Knee Ext 5 5  Ankle DF 5 5  Ankle PF 5 5   SENSATION: Patient has numbness in posterior thighs bilaterally, she is incontinent for the last 3 months  SPECIAL TESTS: + scour R hip + FABER test right hip   FUNCTIONAL MOBILITY: poor mobility with pain in right knee, right hip and low back    BALANCE: poor single leg stand and unable to tandem stand    GAIT: antalgic gait with ambulation and decreased gait speed with fwd flexed posture  OUTCOME MEASURES: TEST Outcome Interpretation  5 times sit<>stand 16.96sec >60 yo, >15 sec indicates increased risk for falls  10 meter walk test   1.18               m/s <1.0 m/s indicates increased risk for falls; limited community ambulator  Timed up and Go  9.64               sec <14 sec indicates increased risk for falls  6 minute walk test  900            Feet 1000 feet is community ambulator  oswestry  42% 0 = no disabiity                              PT Education - [name] 10, 2016 1649    Education provided Yes   Education Details HEP   Person(s) Educated Patient   Methods Explanation   Comprehension Verbalized understanding             PT Long Term Goals - January 10, 2016 1642    PT LONG TERM GOAL #1   Title Patient will reduce timed up and go to <11 seconds to reduce fall risk and demonstrate improved transfer/gait ability.   Time 8   Period Weeks   Status New   PT LONG TERM GOAL #2   Title Patient will be independent in home exercise program to improve strength/mobility for better functional independence with ADLs.   Time 8   Period Weeks   Status New   PT LONG TERM GOAL #3   Title Patient will increase six minute walk test distance to >1000 for progression to community ambulator and improve gait ability   Time 8   Period Weeks   Status New   PT LONG TERM GOAL #4   Title Patient will report a worst pain of 3/10 on VAS in             to improve tolerance with ADLs and reduced symptoms with activities.     Time 8   Period Weeks   Status New               Plan - 01/10/16 1722    Clinical Impression Statement Patient presents with dx of low back pain that is chronic and she has had back surgery x 3. She has pain in her right hip and right knee that gets worse with movement. She has + scour test and + FABBER test right hip. She has weakness in BLE and decreased sensation BLE posterior thighs. She reports urinary incontenence  for the last 3 months. She has antalgic gait wiht decreased gait speed and reports of falls and decreased  balance.    Rehab Potential Fair   Clinical Impairments Affecting Rehab Potential obesity, chronic back pain    PT Frequency 2x / week   PT Duration 8 weeks   PT Treatment/Interventions Manual techniques;Gait training;Functional mobility training;Therapeutic activities;Therapeutic exercise;Balance training;Aquatic Therapy;Biofeedback;Moist Heat;Cryotherapy;Electrical Stimulation   PT Next Visit Plan manual therapy to right hip and therapeutic exercise   Consulted and Agree with Plan of Care Patient      Patient will benefit from skilled therapeutic intervention in order to improve the following deficits and impairments:  Abnormal gait, Pain, Difficulty walking, Decreased balance, Decreased strength, Obesity, Impaired flexibility, Decreased activity tolerance, Decreased endurance, Decreased range of motion, Decreased mobility, Impaired sensation  Visit Diagnosis: Left-sided low back pain with left-sided sciatica      G-Codes - January 10, 2016 1732    Functional Assessment Tool Used TUG, 5 x sit to stand, 10 MW, 6 MW   Functional Limitation Mobility: Walking and moving around   Mobility: Walking and Moving Around Current Status [phone]) At least 60 percent but less than 80 percent impaired, limited or restricted   Mobility: Walking and Moving Around Goal Status (U0454) At least 40 percent but less than 60 percent impaired, limited or restricted  Problem  List Patient Active Problem List   Diagnosis Date Noted  . Urinary incontinence 12/07/2015  . Aortic heart murmur on examination 12/06/2015  . Incontinence in female 12/06/2015  . Gait instability 12/06/2015  . Shortness of breath 12/06/2015  . Screening-pulmonary TB 05/03/2015  . Memory loss 05/03/2015  . Allergic rhinitis 05/02/2015  . Blood glucose elevated 05/02/2015  . HLD (hyperlipidemia) 05/02/2015  . Benign hypertension 05/02/2015  . Adult hypothyroidism 05/02/2015  . Malaise and fatigue 05/02/2015  . Anxiety, mild 05/02/2015  . Billowing mitral valve 05/02/2015  . NASH (nonalcoholic steatohepatitis) 05/02/2015  . Adiposity 05/02/2015  . Arthritis, degenerative 05/02/2015  . Disorder of peripheral nervous system ([place]) 05/02/2015  . Anxiety 01/27/2015  . Hepatic cirrhosis ([place]) 01/12/2015  . Hammer toe 11/18/2014  . Hallux rigidus of right foot 11/18/2014  . Hallux rigidus of left foot 11/18/2014  . Arthritis of ankle or foot, degenerative 11/18/2014  . Pain in shoulder 10/26/2014  . Peripheral nerve disease ([place]) 10/22/2012  . Arthritis of knee, degenerative 10/16/2011   Alanson Puls, PT, DPT [place] 12/27/2015, 9:39 AM  [place] MAIN [facility] SERVICES [address] [place], [place], [address] Phone: [phone]   Fax:  [phone]  Name: SANDRIKA SCHWINN MRN: [id number] Date of Birth: 12/29/1940

## 2015-12-27 LAB — ECHOCARDIOGRAM COMPLETE
AV Area VTI index: 0.97 cm2/m2
AV Peak grad: 20 mmHg
AV pk vel: 224 cm/s
AV vel: 2.15
AVAREAMEANV: 1.81 cm2
AVAREAMEANVIN: 0.82 cm2/m2
AVAREAVTI: 1.94 cm2
AVCELMEANRAT: 0.64
AVG: 11 mmHg
Ao pk vel: 0.68 m/s
CHL CUP AV PEAK INDEX: 0.87
CHL CUP MV DEC (S): 426
CHL CUP STROKE VOLUME: 68 mL
DOP CAL AO MEAN VELOCITY: 156 cm/s
EERAT: 15.37
EWDT: 426 ms
FS: 47 % — AB (ref 28–44)
IV/PV OW: 0.93
LA ID, A-P, ES: 40 cm
LA vol: 82.3 cm3
LADIAMINDEX: 1.8 cm/m2
LAVOLA4C: 88.5 mL
LAVOLIN: 37.1 mL/m2
LDCA: 2.84 cm2
LEFT ATRIUM END SYS DIAM: 40 cm
LV E/e' medial: 15.37
LV E/e'average: 15.37
LV PW d: 11.6 mm — AB (ref 0.6–1.1)
LV TDI E'MEDIAL: 6.2
LV dias vol: 89 mL (ref 46–106)
LV sys vol index: 10 mL/m2
LVDIAVOLIN: 40 mL/m2
LVELAT: 7.94 cm/s
LVOT VTI: 33.5 cm
LVOT peak grad rest: 9 mmHg
LVOT peak vel: 153 cm/s
LVOTD: 19 mm
LVOTSV: 95 cm3
LVOTVTI: 0.76 cm
LVSYSVOL: 21 mL (ref 14–42)
MV Peak grad: 6 mmHg
MV pk A vel: 125 m/s
MVPKEVEL: 122 m/s
P 1/2 time: 546 ms
Simpson's disk: 76
TAPSE: 25.4 cm
TDI e' lateral: 7.94
VTI: 44.2 cm
Valve area index: 0.97
Valve area: 2.15 cm2

## 2015-12-29 ENCOUNTER — Ambulatory Visit: Payer: Medicare Other | Admitting: Physical Therapy

## 2015-12-29 ENCOUNTER — Encounter: Payer: Self-pay | Admitting: Physical Therapy

## 2015-12-29 DIAGNOSIS — M5442 Lumbago with sciatica, left side: Secondary | ICD-10-CM

## 2015-12-29 NOTE — Therapy (Signed)
[place] MAIN [facility] SERVICES [address] [place], [place], [address] Phone: [phone]   Fax:  [phone]  Physical Therapy Treatment  Patient Details  Name: Anita Bennett MRN: [id number] Date of Birth: 06-02-1941 Referring Provider: Margarita Rana   Encounter Date: 12/29/2015      PT End of Session - 12/29/15 1611    Visit Number 2   Number of Visits 17   Date for PT Re-Evaluation 02/27/16   PT Start Time 0415   PT Stop Time 0455   PT Time Calculation (min) 40 min   Equipment Utilized During Treatment Gait belt   Activity Tolerance Patient tolerated treatment well   Behavior During Therapy [facility] for tasks assessed/performed      Past Medical History  Diagnosis Date  . History of toe surgery     12/01/14 [facility]  . H/O knee surgery     2011 [facility] knee (replacement)  . Heart murmur   . Arrhythmia   . Hypertension   . Hyperlipidemia   . History of kidney stones   . Thyroid disease   . H/O cirrhosis     Past Surgical History  Procedure Laterality Date  . Back surgery    . Total knee arthroplasty Bilateral   . Total abdominal hysterectomy    . Arm surgery    . Toe surgery    . Cholecystectomy      There were no vitals filed for this visit.      Subjective Assessment - 12/29/15 1617    Subjective Patient says that she is walking bent over and forward and has back pain. She has 4-5/10 in her back when she walks and then it goes away when she is supien.    Currently in Pain? Yes   Pain Score 4    Pain Location Back   Pain Onset Other (comment)  chronic pain and 3 back surgeries     Therapeutic exercises:  Hooklying marching x 20 x 2 sets Hooklying trunk rotation x 20 left and right  X 2 sets Single leg to chest flex x 30 sec x 3 x 2 sets Bridging x 15  hooklying position with TA    Manual therapy :  Long axis distraction grade 4 RLE x 8 minutes Hip lateral distraction glide grade 4  Prone knee flex stretch BLE  x 30 sec x 3 STM to left SI for relief of pain   Patient has pain relief following manual therapy from 5/10 to 0/10. Patient has great deal of difficulty with moving on the mat table.                            PT Education - 12/29/15 1611    Education provided Yes   Education Details HEP   Person(s) Educated Patient   Methods Explanation   Comprehension Verbalized understanding             PT Long Term Goals - 12/26/15 1642    PT LONG TERM GOAL #1   Title Patient will reduce timed up and go to <11 seconds to reduce fall risk and demonstrate improved transfer/gait ability.   Time 8   Period Weeks   Status New   PT LONG TERM GOAL #2   Title Patient will be independent in home exercise program to improve strength/mobility for better functional independence with ADLs.   Time 8   Period Weeks  Status New   PT LONG TERM GOAL #3   Title Patient will increase six minute walk test distance to >1000 for progression to community ambulator and improve gait ability   Time 8   Period Weeks   Status New   PT LONG TERM GOAL #4   Title Patient will report a worst pain of 3/10 on VAS in             to improve tolerance with ADLs and reduced symptoms with activities.    Time 8   Period Weeks   Status New               Plan - 12/29/15 1618    Clinical Impression Statement Patient was seen for beginning stretching and beginning core , BLE sterngthening exericses .   PT Treatment/Interventions Manual techniques;Gait training;Functional mobility training;Therapeutic activities;Therapeutic exercise;Balance training;Aquatic Therapy;Biofeedback;Moist Heat;Cryotherapy;Electrical Stimulation   PT Next Visit Plan manual therapy to right hip and therapeutic exercise   Consulted and Agree with Plan of Care Patient      Patient will benefit from skilled therapeutic intervention in order to improve the following deficits and impairments:  Abnormal gait, Pain,  Difficulty walking, Decreased balance, Decreased strength, Obesity, Impaired flexibility, Decreased activity tolerance, Decreased endurance, Decreased range of motion, Decreased mobility, Impaired sensation  Visit Diagnosis: Left-sided low back pain with left-sided sciatica     Problem List Patient Active Problem List   Diagnosis Date Noted  . Urinary incontinence 12/07/2015  . Aortic heart murmur on examination 12/06/2015  . Incontinence in female 12/06/2015  . Gait instability 12/06/2015  . Shortness of breath 12/06/2015  . Screening-pulmonary TB 05/03/2015  . Memory loss 05/03/2015  . Allergic rhinitis 05/02/2015  . Blood glucose elevated 05/02/2015  . HLD (hyperlipidemia) 05/02/2015  . Benign hypertension 05/02/2015  . Adult hypothyroidism 05/02/2015  . Malaise and fatigue 05/02/2015  . Anxiety, mild 05/02/2015  . Billowing mitral valve 05/02/2015  . NASH (nonalcoholic steatohepatitis) 05/02/2015  . Adiposity 05/02/2015  . Arthritis, degenerative 05/02/2015  . Disorder of peripheral nervous system ([place]) 05/02/2015  . Anxiety 01/27/2015  . Hepatic cirrhosis ([place]) 01/12/2015  . Hammer toe 11/18/2014  . Hallux rigidus of right foot 11/18/2014  . Hallux rigidus of left foot 11/18/2014  . Arthritis of ankle or foot, degenerative 11/18/2014  . Pain in shoulder 10/26/2014  . Peripheral nerve disease ([place]) 10/22/2012  . Arthritis of knee, degenerative 10/16/2011  Alanson Puls, PT, DPT  [place], Minette Headland S 12/29/2015, 4:22 PM  [place] MAIN [facility] SERVICES [address] [place], [place], [address] Phone: [phone]   Fax:  [phone]  Name: Anita Bennett MRN: [id number] Date of Birth: January 22, 1941

## 2016-01-02 ENCOUNTER — Ambulatory Visit: Payer: Medicare Other | Admitting: Physical Therapy

## 2016-01-02 NOTE — Telephone Encounter (Signed)
Error. sd

## 2016-01-04 ENCOUNTER — Encounter: Payer: Medicare Other | Admitting: Physical Therapy

## 2016-01-06 ENCOUNTER — Other Ambulatory Visit: Payer: Self-pay | Admitting: Family Medicine

## 2016-01-06 DIAGNOSIS — E038 Other specified hypothyroidism: Secondary | ICD-10-CM

## 2016-01-10 ENCOUNTER — Ambulatory Visit: Payer: Medicare Other | Admitting: Physical Therapy

## 2016-01-11 ENCOUNTER — Encounter: Payer: Self-pay | Admitting: Physical Therapy

## 2016-01-11 ENCOUNTER — Ambulatory Visit: Payer: Medicare Other | Admitting: Physical Therapy

## 2016-01-11 DIAGNOSIS — M5442 Lumbago with sciatica, left side: Secondary | ICD-10-CM | POA: Diagnosis not present

## 2016-01-11 NOTE — Therapy (Signed)
[place] MAIN [facility] SERVICES [address] [place], [place], [address] Phone: [phone]   Fax:  [phone]  Physical Therapy Treatment  Patient Details  Name: Anita Bennett MRN: [id number] Date of Birth: [birth year] Referring Provider: Margarita Rana   Encounter Date: 01/11/2016      PT End of Session - 01/11/16 1541    Visit Number 3   Number of Visits 17   Date for PT Re-Evaluation 02/27/16   PT Start Time 1600   PT Stop Time 1645   PT Time Calculation (min) 45 min   Equipment Utilized During Treatment Gait belt   Activity Tolerance Patient tolerated treatment well   Behavior During Therapy [facility] for tasks assessed/performed      Past Medical History  Diagnosis Date  . History of toe surgery     12/01/14 [facility]  . H/O knee surgery     2011 [facility] knee (replacement)  . Heart murmur   . Arrhythmia   . Hypertension   . Hyperlipidemia   . History of kidney stones   . Thyroid disease   . H/O cirrhosis     Past Surgical History  Procedure Laterality Date  . Back surgery    . Total knee arthroplasty Bilateral   . Total abdominal hysterectomy    . Arm surgery    . Toe surgery    . Cholecystectomy      There were no vitals filed for this visit.      Subjective Assessment - 01/11/16 1539    Subjective Patient says that she is walking bent over and forward and has back pain. She has 4-5/10 in her back when she walks and then it goes away when she is supine.    Pain Onset Other (comment)  chronic pain and 3 back surgeries        Therapeutic exercises: Hooklying marching x 20 x 2 sets Hooklying trunk rotation x 20 left and right X 2 sets Single leg to chest flex x 30 sec x 3 x 2 sets Bridging x 15  hooklying position with TA  Ascending / descending steps with 1 rail Step ups x 20  Eccentric step downs 3 inch stool x 20 Patient is able to perform steps ascending and descending with 1 step. Patient has less pain  following treatment and has better strength in LE's for stepping.                             PT Education - 01/11/16 1540    Education provided Yes   Education Details HEP   Person(s) Educated Patient   Methods Explanation   Comprehension Verbalized understanding             PT Long Term Goals - 12/26/15 1642    PT LONG TERM GOAL #1   Title Patient will reduce timed up and go to <11 seconds to reduce fall risk and demonstrate improved transfer/gait ability.   Time 8   Period Weeks   Status New   PT LONG TERM GOAL #2   Title Patient will be independent in home exercise program to improve strength/mobility for better functional independence with ADLs.   Time 8   Period Weeks   Status New   PT LONG TERM GOAL #3   Title Patient will increase six minute walk test distance to >1000 for progression to community ambulator and improve gait ability   Time  8   Period Weeks   Status New   PT LONG TERM GOAL #4   Title Patient will report a worst pain of 3/10 on VAS in             to improve tolerance with ADLs and reduced symptoms with activities.    Time 8   Period Weeks   Status New             Patient will benefit from skilled therapeutic intervention in order to improve the following deficits and impairments:     Visit Diagnosis: Left-sided low back pain with left-sided sciatica     Problem List Patient Active Problem List   Diagnosis Date Noted  . Urinary incontinence 12/07/2015  . Aortic heart murmur on examination 12/06/2015  . Incontinence in female 12/06/2015  . Gait instability 12/06/2015  . Shortness of breath 12/06/2015  . Screening-pulmonary TB 05/03/2015  . Memory loss 05/03/2015  . Allergic rhinitis 05/02/2015  . Blood glucose elevated 05/02/2015  . HLD (hyperlipidemia) 05/02/2015  . Benign hypertension 05/02/2015  . Adult hypothyroidism 05/02/2015  . Malaise and fatigue 05/02/2015  . Anxiety, mild 05/02/2015  .  Billowing mitral valve 05/02/2015  . NASH (nonalcoholic steatohepatitis) 05/02/2015  . Adiposity 05/02/2015  . Arthritis, degenerative 05/02/2015  . Disorder of peripheral nervous system ([place]) 05/02/2015  . Anxiety 01/27/2015  . Hepatic cirrhosis ([place]) 01/12/2015  . Hammer toe 11/18/2014  . Hallux rigidus of right foot 11/18/2014  . Hallux rigidus of left foot 11/18/2014  . Arthritis of ankle or foot, degenerative 11/18/2014  . Pain in shoulder 10/26/2014  . Peripheral nerve disease ([place]) 10/22/2012  . Arthritis of knee, degenerative 10/16/2011  Alanson Puls, PT, DPT  [place] S 01/11/2016, 3:44 PM  [place] MAIN [facility] SERVICES [address] [place], [place], [address] Phone: [phone]   Fax:  [phone]  Name: JASLENE MARSTELLER MRN: [id number] Date of Birth: [birth year]

## 2016-01-16 ENCOUNTER — Encounter: Payer: Self-pay | Admitting: Physical Therapy

## 2016-01-16 ENCOUNTER — Ambulatory Visit: Payer: Medicare Other | Admitting: Physical Therapy

## 2016-01-16 DIAGNOSIS — M5442 Lumbago with sciatica, left side: Secondary | ICD-10-CM | POA: Diagnosis not present

## 2016-01-16 NOTE — Therapy (Signed)
[place] MAIN [facility] SERVICES [address] [place], [place], [address] Phone: [phone]   Fax:  [phone]  Physical Therapy Treatment  Patient Details  Name: Anita Bennett MRN: [id number] Date of Birth: 05/31/1941 Referring Provider: Margarita Rana   Encounter Date: 01/16/2016      PT End of Session - 01/16/16 1642    Visit Number 4   Number of Visits 17   Date for PT Re-Evaluation 02/27/16   PT Start Time 0416   PT Stop Time 0445   PT Time Calculation (min) 29 min   Equipment Utilized During Treatment Gait belt   Activity Tolerance Patient tolerated treatment well   Behavior During Therapy [facility] for tasks assessed/performed      Past Medical History  Diagnosis Date  . History of toe surgery     12/01/14 [facility]  . H/O knee surgery     2011 [facility] knee (replacement)  . Heart murmur   . Arrhythmia   . Hypertension   . Hyperlipidemia   . History of kidney stones   . Thyroid disease   . H/O cirrhosis     Past Surgical History  Procedure Laterality Date  . Back surgery    . Total knee arthroplasty Bilateral   . Total abdominal hysterectomy    . Arm surgery    . Toe surgery    . Cholecystectomy      There were no vitals filed for this visit.      Subjective Assessment - 01/16/16 1641    Subjective Patient says that she is walking bent over and forward and has back pain. She has 4-5/10 in her back when she walks and then it goes away when she is supine.    Currently in Pain? No/denies   Pain Onset Other (comment)  chronic pain and 3 back surgeries      Patient is ambulating with better upright posture and less pain in her hip.  She has decreased RLE strength and decreased core strength and is having RUE shoulder pain with movement. Therapeutic exercise:  Leg press 90 lbs x 20 x 3 Heel raises  20 x 3 sets standing hip abd with YTB x 20  side stepping left and right in parallel bars 10 feet x 3 step ups from floor  to 6 inch stool x 20 bilateral sit to stand x 10 marching in parallel bars x 20 hooklying exercises including marching and hip abd/ER                            PT Education - 01/16/16 1641    Education provided Yes   Education Details HEP   Person(s) Educated Patient   Methods Explanation   Comprehension Verbalized understanding             PT Long Term Goals - 12/26/15 1642    PT LONG TERM GOAL #1   Title Patient will reduce timed up and go to <11 seconds to reduce fall risk and demonstrate improved transfer/gait ability.   Time 8   Period Weeks   Status New   PT LONG TERM GOAL #2   Title Patient will be independent in home exercise program to improve strength/mobility for better functional independence with ADLs.   Time 8   Period Weeks   Status New   PT LONG TERM GOAL #3   Title Patient will increase six minute walk test distance to >  1000 for progression to community ambulator and improve gait ability   Time 8   Period Weeks   Status New   PT LONG TERM GOAL #4   Title Patient will report a worst pain of 3/10 on VAS in             to improve tolerance with ADLs and reduced symptoms with activities.    Time 8   Period Weeks   Status New               Plan - 01/16/16 1642    Clinical Impression Statement Patient needs occasional verbal cueing to improve posture and cueing to correctly perform exercises slowly, holding at end of range to increase motor firing of desired muscle to encourage fatigue.    PT Treatment/Interventions Manual techniques;Gait training;Functional mobility training;Therapeutic activities;Therapeutic exercise;Balance training;Aquatic Therapy;Biofeedback;Moist Heat;Cryotherapy;Electrical Stimulation   PT Next Visit Plan manual therapy to right hip and therapeutic exercise   Consulted and Agree with Plan of Care Patient      Patient will benefit from skilled therapeutic intervention in order to improve the following  deficits and impairments:  Abnormal gait, Pain, Difficulty walking, Decreased balance, Decreased strength, Obesity, Impaired flexibility, Decreased activity tolerance, Decreased endurance, Decreased range of motion, Decreased mobility, Impaired sensation  Visit Diagnosis: Left-sided low back pain with left-sided sciatica     Problem List Patient Active Problem List   Diagnosis Date Noted  . Urinary incontinence 12/07/2015  . Aortic heart murmur on examination 12/06/2015  . Incontinence in female 12/06/2015  . Gait instability 12/06/2015  . Shortness of breath 12/06/2015  . Screening-pulmonary TB 05/03/2015  . Memory loss 05/03/2015  . Allergic rhinitis 05/02/2015  . Blood glucose elevated 05/02/2015  . HLD (hyperlipidemia) 05/02/2015  . Benign hypertension 05/02/2015  . Adult hypothyroidism 05/02/2015  . Malaise and fatigue 05/02/2015  . Anxiety, mild 05/02/2015  . Billowing mitral valve 05/02/2015  . NASH (nonalcoholic steatohepatitis) 05/02/2015  . Adiposity 05/02/2015  . Arthritis, degenerative 05/02/2015  . Disorder of peripheral nervous system ([place]) 05/02/2015  . Anxiety 01/27/2015  . Hepatic cirrhosis ([place]) 01/12/2015  . Hammer toe 11/18/2014  . Hallux rigidus of right foot 11/18/2014  . Hallux rigidus of left foot 11/18/2014  . Arthritis of ankle or foot, degenerative 11/18/2014  . Pain in shoulder 10/26/2014  . Peripheral nerve disease ([place]) 10/22/2012  . Arthritis of knee, degenerative 10/16/2011   Alanson Puls, PT, DPT [place], Minette Headland S 01/16/2016, 4:44 PM  [place] MAIN [facility] SERVICES [address] [place], [place], [address] Phone: [phone]   Fax:  [phone]  Name: Anita Bennett MRN: [id number] Date of Birth: June 11, 1941

## 2016-01-18 ENCOUNTER — Encounter: Payer: Self-pay | Admitting: Physical Therapy

## 2016-01-18 ENCOUNTER — Ambulatory Visit: Payer: Medicare Other | Admitting: Physical Therapy

## 2016-01-18 DIAGNOSIS — M5442 Lumbago with sciatica, left side: Secondary | ICD-10-CM

## 2016-01-18 NOTE — Therapy (Signed)
[place] MAIN [facility] SERVICES [address] [place], [place], [address] Phone: [phone]   Fax:  [phone]  Physical Therapy Treatment  Patient Details  Name: Anita Bennett MRN: [id number] Date of Birth: 1941-01-17 Referring Provider: Margarita Rana   Encounter Date: 01/18/2016      PT End of Session - 01/18/16 1628    Visit Number 5   Number of Visits 17   Date for PT Re-Evaluation 02/27/16   PT Start Time 0400   PT Stop Time 0440   PT Time Calculation (min) 40 min   Equipment Utilized During Treatment Gait belt   Activity Tolerance Patient tolerated treatment well   Behavior During Therapy [facility] for tasks assessed/performed      Past Medical History  Diagnosis Date  . History of toe surgery     12/01/14 [facility]  . H/O knee surgery     2011 [facility] knee (replacement)  . Heart murmur   . Arrhythmia   . Hypertension   . Hyperlipidemia   . History of kidney stones   . Thyroid disease   . H/O cirrhosis     Past Surgical History  Procedure Laterality Date  . Back surgery    . Total knee arthroplasty Bilateral   . Total abdominal hysterectomy    . Arm surgery    . Toe surgery    . Cholecystectomy      There were no vitals filed for this visit.      Subjective Assessment - 01/18/16 1616    Subjective Patient is not having pain in her back today and her trunk is able to stand up straighter and walk wihtout a flexed trunk.   Pain Onset Other (comment)  chronic pain and 3 back surgeries     Hooklying marching with TA,  hooklying clam with TA  Hooklying hip abd/ER with TA Leg press 100 lbs x 20 x 2, hee raises x 20 x 2 Nu-step x 5 minutes l 5  Standing hip abd/flex/ ext x 10  Patient needs occasional verbal cueing to improve posture and cueing to correctly perform exercises slowly, holding at end of range to increase motor firing of desired muscle to encourage fatigue.                               PT Education - 01/18/16 1628    Education provided Yes   Education Details HEP   Person(s) Educated Patient   Methods Explanation   Comprehension Verbalized understanding             PT Long Term Goals - 12/26/15 1642    PT LONG TERM GOAL #1   Title Patient will reduce timed up and go to <11 seconds to reduce fall risk and demonstrate improved transfer/gait ability.   Time 8   Period Weeks   Status New   PT LONG TERM GOAL #2   Title Patient will be independent in home exercise program to improve strength/mobility for better functional independence with ADLs.   Time 8   Period Weeks   Status New   PT LONG TERM GOAL #3   Title Patient will increase six minute walk test distance to >1000 for progression to community ambulator and improve gait ability   Time 8   Period Weeks   Status New   PT LONG TERM GOAL #4   Title Patient will report a worst pain of  3/10 on VAS in             to improve tolerance with ADLs and reduced symptoms with activities.    Time 8   Period Weeks   Status New               Plan - 01/18/16 1629    Clinical Impression Statement [place] is having less back pain but does have left hip pain during hip flex in supine. Patient is having right shoulder pain during pushing and pulling.    PT Treatment/Interventions Manual techniques;Gait training;Functional mobility training;Therapeutic activities;Therapeutic exercise;Balance training;Aquatic Therapy;Biofeedback;Moist Heat;Cryotherapy;Electrical Stimulation   PT Next Visit Plan manual therapy to right hip and therapeutic exercise   Consulted and Agree with Plan of Care Patient      Patient will benefit from skilled therapeutic intervention in order to improve the following deficits and impairments:  Abnormal gait, Pain, Difficulty walking, Decreased balance, Decreased strength, Obesity, Impaired flexibility, Decreased activity tolerance, Decreased endurance, Decreased range of motion, Decreased  mobility, Impaired sensation  Visit Diagnosis: Left-sided low back pain with left-sided sciatica     Problem List Patient Active Problem List   Diagnosis Date Noted  . Urinary incontinence 12/07/2015  . Aortic heart murmur on examination 12/06/2015  . Incontinence in female 12/06/2015  . Gait instability 12/06/2015  . Shortness of breath 12/06/2015  . Screening-pulmonary TB 05/03/2015  . Memory loss 05/03/2015  . Allergic rhinitis 05/02/2015  . Blood glucose elevated 05/02/2015  . HLD (hyperlipidemia) 05/02/2015  . Benign hypertension 05/02/2015  . Adult hypothyroidism 05/02/2015  . Malaise and fatigue 05/02/2015  . Anxiety, mild 05/02/2015  . Billowing mitral valve 05/02/2015  . NASH (nonalcoholic steatohepatitis) 05/02/2015  . Adiposity 05/02/2015  . Arthritis, degenerative 05/02/2015  . Disorder of peripheral nervous system ([place]) 05/02/2015  . Anxiety 01/27/2015  . Hepatic cirrhosis ([place]) 01/12/2015  . Hammer toe 11/18/2014  . Hallux rigidus of right foot 11/18/2014  . Hallux rigidus of left foot 11/18/2014  . Arthritis of ankle or foot, degenerative 11/18/2014  . Pain in shoulder 10/26/2014  . Peripheral nerve disease ([place]) 10/22/2012  . Arthritis of knee, degenerative 10/16/2011   Alanson Puls, PT, DPT [place] S 01/18/2016, 4:42 PM  [place] MAIN [facility] SERVICES [address] [place], [place], [address] Phone: [phone]   Fax:  [phone]  Name: Anita Bennett MRN: [id number] Date of Birth: [birth year]

## 2016-01-23 DIAGNOSIS — G3184 Mild cognitive impairment, so stated: Secondary | ICD-10-CM | POA: Diagnosis not present

## 2016-01-23 DIAGNOSIS — R2689 Other abnormalities of gait and mobility: Secondary | ICD-10-CM | POA: Diagnosis not present

## 2016-01-23 DIAGNOSIS — E538 Deficiency of other specified B group vitamins: Secondary | ICD-10-CM | POA: Diagnosis not present

## 2016-01-23 DIAGNOSIS — G43119 Migraine with aura, intractable, without status migrainosus: Secondary | ICD-10-CM | POA: Diagnosis not present

## 2016-01-25 ENCOUNTER — Ambulatory Visit: Payer: Medicare Other | Attending: Family Medicine | Admitting: Physical Therapy

## 2016-01-25 ENCOUNTER — Encounter: Payer: Self-pay | Admitting: Physical Therapy

## 2016-01-25 DIAGNOSIS — M5442 Lumbago with sciatica, left side: Secondary | ICD-10-CM | POA: Diagnosis not present

## 2016-01-25 DIAGNOSIS — K7581 Nonalcoholic steatohepatitis (NASH): Secondary | ICD-10-CM | POA: Diagnosis not present

## 2016-01-25 DIAGNOSIS — R413 Other amnesia: Secondary | ICD-10-CM | POA: Diagnosis not present

## 2016-01-25 DIAGNOSIS — K7460 Unspecified cirrhosis of liver: Secondary | ICD-10-CM | POA: Diagnosis not present

## 2016-01-25 NOTE — Therapy (Signed)
[place] MAIN [facility] SERVICES [address] [place], [place], [address] Phone: [phone]   Fax:  [phone]  Physical Therapy Treatment  Patient Details  Name: Anita Bennett MRN: [id number] Date of Birth: [birth year] Referring Provider: Margarita Rana   Encounter Date: 01/25/2016      PT End of Session - 01/25/16 1629    Visit Number 6   Number of Visits 17   Date for PT Re-Evaluation 02/27/16   PT Start Time 0400   PT Stop Time 0440   PT Time Calculation (min) 40 min   Equipment Utilized During Treatment Gait belt   Activity Tolerance Patient tolerated treatment well   Behavior During Therapy [facility] for tasks assessed/performed      Past Medical History  Diagnosis Date  . History of toe surgery     12/01/14 [facility]  . H/O knee surgery     2011 [facility] knee (replacement)  . Heart murmur   . Arrhythmia   . Hypertension   . Hyperlipidemia   . History of kidney stones   . Thyroid disease   . H/O cirrhosis     Past Surgical History  Procedure Laterality Date  . Back surgery    . Total knee arthroplasty Bilateral   . Total abdominal hysterectomy    . Arm surgery    . Toe surgery    . Cholecystectomy      There were no vitals filed for this visit.      Subjective Assessment - 01/25/16 1628    Subjective Patient is not having pain in her back today and her trunk is able to stand up straighter and walk wihtout a flexed trunk.   Currently in Pain? No/denies   Pain Onset Other (comment)  chronic pain and 3 back surgeries     Therapeutic exercise: Leg press x 20 x 3 with plate 9, heel raises plate 9  Machine knee flex 6 plates, machine knee extension 3 plates x 15 x 3 Cross trainer x 10 mins  Reviewed stretches for hip flex x 30 ssec x 3 Patient has a aquatic therapy appointment tomorrow and then will be discharged.                              PT Education - 01/25/16 1629    Education provided Yes    Education Details progression of exercise to gym    Person(s) Educated Patient   Methods Explanation   Comprehension Verbalized understanding             PT Long Term Goals - 12/26/15 1642    PT LONG TERM GOAL #1   Title Patient will reduce timed up and go to <11 seconds to reduce fall risk and demonstrate improved transfer/gait ability.   Time 8   Period Weeks   Status New   PT LONG TERM GOAL #2   Title Patient will be independent in home exercise program to improve strength/mobility for better functional independence with ADLs.   Time 8   Period Weeks   Status New   PT LONG TERM GOAL #3   Title Patient will increase six minute walk test distance to >1000 for progression to community ambulator and improve gait ability   Time 8   Period Weeks   Status New   PT LONG TERM GOAL #4   Title Patient will report a worst pain of 3/10 on VAS in  to improve tolerance with ADLs and reduced symptoms with activities.    Time 8   Period Weeks   Status New               Plan - 01/25/16 1630    Clinical Impression Statement Patient is having much less back pain but continues to have the the right hip pain when she lifts up her leg. She is ambulating with better posture and less pain.    PT Treatment/Interventions Manual techniques;Gait training;Functional mobility training;Therapeutic activities;Therapeutic exercise;Balance training;Aquatic Therapy;Biofeedback;Moist Heat;Cryotherapy;Electrical Stimulation   PT Next Visit Plan manual therapy to right hip and therapeutic exercise   Consulted and Agree with Plan of Care Patient      Patient will benefit from skilled therapeutic intervention in order to improve the following deficits and impairments:  Abnormal gait, Pain, Difficulty walking, Decreased balance, Decreased strength, Obesity, Impaired flexibility, Decreased activity tolerance, Decreased endurance, Decreased range of motion, Decreased mobility, Impaired  sensation  Visit Diagnosis: Left-sided low back pain with left-sided sciatica     Problem List Patient Active Problem List   Diagnosis Date Noted  . Urinary incontinence 12/07/2015  . Aortic heart murmur on examination 12/06/2015  . Incontinence in female 12/06/2015  . Gait instability 12/06/2015  . Shortness of breath 12/06/2015  . Screening-pulmonary TB 05/03/2015  . Memory loss 05/03/2015  . Allergic rhinitis 05/02/2015  . Blood glucose elevated 05/02/2015  . HLD (hyperlipidemia) 05/02/2015  . Benign hypertension 05/02/2015  . Adult hypothyroidism 05/02/2015  . Malaise and fatigue 05/02/2015  . Anxiety, mild 05/02/2015  . Billowing mitral valve 05/02/2015  . NASH (nonalcoholic steatohepatitis) 05/02/2015  . Adiposity 05/02/2015  . Arthritis, degenerative 05/02/2015  . Disorder of peripheral nervous system ([place]) 05/02/2015  . Anxiety 01/27/2015  . Hepatic cirrhosis ([place]) 01/12/2015  . Hammer toe 11/18/2014  . Hallux rigidus of right foot 11/18/2014  . Hallux rigidus of left foot 11/18/2014  . Arthritis of ankle or foot, degenerative 11/18/2014  . Pain in shoulder 10/26/2014  . Peripheral nerve disease ([place]) 10/22/2012  . Arthritis of knee, degenerative 10/16/2011   Alanson Puls, PT, DPT [place] 01/25/2016, 4:35 PM  [place] MAIN [facility] SERVICES [address] [place], [place], [address] Phone: [phone]   Fax:  [phone]  Name: HEAVIN SEBREE MRN: [id number] Date of Birth: June 12, 1941

## 2016-01-26 ENCOUNTER — Ambulatory Visit: Payer: Medicare Other | Admitting: Physical Therapy

## 2016-01-26 DIAGNOSIS — M5442 Lumbago with sciatica, left side: Secondary | ICD-10-CM | POA: Diagnosis not present

## 2016-01-26 NOTE — Therapy (Signed)
[place] MAIN [facility] SERVICES [address] [place], [place], [address] Phone: [phone]   Fax:  [phone]  Physical Therapy Treatment  Patient Details  Name: Anita Bennett MRN: [id number] Date of Birth: 1941/01/16 Referring Provider: Margarita Rana   Encounter Date: 01/26/2016      PT End of Session - 01/26/16 1009    Visit Number 7   Number of Visits 17   Date for PT Re-Evaluation 02/27/16   PT Start Time 0930   PT Stop Time 1013   PT Time Calculation (min) 43 min   Equipment Utilized During Treatment Gait belt   Activity Tolerance Patient tolerated treatment well   Behavior During Therapy [facility] for tasks assessed/performed      Past Medical History  Diagnosis Date  . History of toe surgery     12/01/14 [facility]  . H/O knee surgery     2011 [facility] knee (replacement)  . Heart murmur   . Arrhythmia   . Hypertension   . Hyperlipidemia   . History of kidney stones   . Thyroid disease   . H/O cirrhosis     Past Surgical History  Procedure Laterality Date  . Back surgery    . Total knee arthroplasty Bilateral   . Total abdominal hysterectomy    . Arm surgery    . Toe surgery    . Cholecystectomy      There were no vitals filed for this visit.      Subjective Assessment - 01/26/16 0919    Subjective Pt reported she plans to join the [facility]. Pt feels alot better with her back pain. Pt states her back hurts when putting linens onto her bed.    Pain Onset Other (comment)  chronic pain and 3 back surgeries                     Adult Aquatic Therapy - 01/26/16 1004    Aquatic Therapy Subjective   Subjective Pt voiced dizziness with breathing coordination training with exercises. Dizziness resolved with cues to apply less effort with breathing and for nasal breathing instead of mouth breathing. Pt voiced fear of slipping and falling on the wet floor.        O: Pt entered/exited the pool via steps with single UE  support on rail.  50 ft =1 lap  Exercises performed in 3'6" depth   4 laps walking with feet propioception. Cued for alignment to decrease LOB laterally    hip flexor stretch with UE overhead bilaterally  spinal traction in forward forward with UE on rail by wall   piriformis stretch in single squat with BUE on rail   4 laps sidestepping with mini squat. (2 laps with noodle for increase balance) (2 laps without noodle).           Cued for coordination with deep core muscle, decreased effort with breathing coordination to minimize dizziness.           Cued for feet propioception and COM alignment in mini squat to rise  Relaxation supported by noodles, gentle pulled through water by PT              PT Education - 01/25/16 1629    Education provided Yes   Education Details progression of exercise to gym    Person(s) Educated Patient   Methods Explanation   Comprehension Verbalized understanding             PT  Long Term Goals - 12/26/15 1642    PT LONG TERM GOAL #1   Title Patient will reduce timed up and go to <11 seconds to reduce fall risk and demonstrate improved transfer/gait ability.   Time 8   Period Weeks   Status New   PT LONG TERM GOAL #2   Title Patient will be independent in home exercise program to improve strength/mobility for better functional independence with ADLs.   Time 8   Period Weeks   Status New   PT LONG TERM GOAL #3   Title Patient will increase six minute walk test distance to >1000 for progression to community ambulator and improve gait ability   Time 8   Period Weeks   Status New   PT LONG TERM GOAL #4   Title Patient will report a worst pain of 3/10 on VAS in             to improve tolerance with ADLs and reduced symptoms with activities.    Time 8   Period Weeks   Status New               Plan - 01/26/16 1010    Clinical Impression Statement Pt required excessive tactile and verbal cues to maintain balance with the  transition from mini squat to stand in order to minimize posterior COM. Pt demo'd proper technique for mini squat and side stepping and voiced understand in using these body mechanics for placing linens on her bed to minimize LBP. Pt also demo'd proper breathing coordination with exercises to engage deep core mm and to avoid breathholding/ dizziness. Pt reported less hip pain when stepping up pool steps with exhalation and deep core mm engagement. Pt will benefit from more aquatic Tx for strengthening and to decrease her fear of falls before d/c. Pt expressed motivation to maintain HEP in her son's pool. Pt was educated to wear water shoes at next session to ensure safety and to increase her confidence with walking in the locker room with less fear for falls.    PT Treatment/Interventions Manual techniques;Gait training;Functional mobility training;Therapeutic activities;Therapeutic exercise;Balance training;Aquatic Therapy;Biofeedback;Moist Heat;Cryotherapy;Electrical Stimulation   PT Next Visit Plan manual therapy to right hip and therapeutic exercise   Consulted and Agree with Plan of Care Patient      Patient will benefit from skilled therapeutic intervention in order to improve the following deficits and impairments:  Abnormal gait, Pain, Difficulty walking, Decreased balance, Decreased strength, Obesity, Impaired flexibility, Decreased activity tolerance, Decreased endurance, Decreased range of motion, Decreased mobility, Impaired sensation  Visit Diagnosis: Left-sided low back pain with left-sided sciatica     Problem List Patient Active Problem List   Diagnosis Date Noted  . Urinary incontinence 12/07/2015  . Aortic heart murmur on examination 12/06/2015  . Incontinence in female 12/06/2015  . Gait instability 12/06/2015  . Shortness of breath 12/06/2015  . Screening-pulmonary TB 05/03/2015  . Memory loss 05/03/2015  . Allergic rhinitis 05/02/2015  . Blood glucose elevated  05/02/2015  . HLD (hyperlipidemia) 05/02/2015  . Benign hypertension 05/02/2015  . Adult hypothyroidism 05/02/2015  . Malaise and fatigue 05/02/2015  . Anxiety, mild 05/02/2015  . Billowing mitral valve 05/02/2015  . NASH (nonalcoholic steatohepatitis) 05/02/2015  . Adiposity 05/02/2015  . Arthritis, degenerative 05/02/2015  . Disorder of peripheral nervous system ([place]) 05/02/2015  . Anxiety 01/27/2015  . Hepatic cirrhosis ([place]) 01/12/2015  . Hammer toe 11/18/2014  . Hallux rigidus of right foot 11/18/2014  . Hallux rigidus of  left foot 11/18/2014  . Arthritis of ankle or foot, degenerative 11/18/2014  . Pain in shoulder 10/26/2014  . Peripheral nerve disease ([place]) 10/22/2012  . Arthritis of knee, degenerative 10/16/2011    Jerl Mina ,PT, DPT, E-RYT  01/26/2016, 10:10 AM  [place] MAIN [facility] SERVICES [address] [place], [place], [address] Phone: [phone]   Fax:  [phone]  Name: Anita Bennett MRN: [id number] Date of Birth: 1940/12/05

## 2016-01-27 ENCOUNTER — Encounter: Payer: Self-pay | Admitting: Family Medicine

## 2016-01-30 ENCOUNTER — Other Ambulatory Visit: Payer: Self-pay | Admitting: Neurology

## 2016-01-30 ENCOUNTER — Ambulatory Visit: Payer: Medicare Other | Admitting: Physical Therapy

## 2016-01-30 DIAGNOSIS — G3184 Mild cognitive impairment, so stated: Secondary | ICD-10-CM

## 2016-01-31 DIAGNOSIS — N3941 Urge incontinence: Secondary | ICD-10-CM | POA: Diagnosis not present

## 2016-01-31 DIAGNOSIS — R339 Retention of urine, unspecified: Secondary | ICD-10-CM | POA: Diagnosis not present

## 2016-01-31 DIAGNOSIS — R350 Frequency of micturition: Secondary | ICD-10-CM | POA: Diagnosis not present

## 2016-01-31 DIAGNOSIS — R3911 Hesitancy of micturition: Secondary | ICD-10-CM | POA: Diagnosis not present

## 2016-02-01 ENCOUNTER — Ambulatory Visit: Payer: Medicare Other | Admitting: Physical Therapy

## 2016-02-02 DIAGNOSIS — R3911 Hesitancy of micturition: Secondary | ICD-10-CM | POA: Insufficient documentation

## 2016-02-02 DIAGNOSIS — R339 Retention of urine, unspecified: Secondary | ICD-10-CM | POA: Insufficient documentation

## 2016-02-07 ENCOUNTER — Ambulatory Visit: Payer: Medicare Other | Admitting: Physical Therapy

## 2016-02-07 DIAGNOSIS — M5442 Lumbago with sciatica, left side: Secondary | ICD-10-CM | POA: Diagnosis not present

## 2016-02-08 NOTE — Therapy (Addendum)
[place] MAIN [facility] SERVICES [address] [place], [place], [address] Phone: [phone]   Fax:  [phone]  Physical Therapy Treatment  Patient Details  Name: Anita Bennett MRN: [id number] Date of Birth: March 06, 1941 Referring Provider: Margarita Rana   Encounter Date: 02/07/2016      PT End of Session - 02/08/16 1910    Visit Number 8   Number of Visits 17   Date for PT Re-Evaluation 02/27/16   PT Start Time 0930   PT Stop Time 1020   PT Time Calculation (min) 50 min   Activity Tolerance Patient tolerated treatment well   Behavior During Therapy [facility] for tasks assessed/performed      Past Medical History  Diagnosis Date  . History of toe surgery     12/01/14 [facility]  . H/O knee surgery     2011 [facility] knee (replacement)  . Heart murmur   . Arrhythmia   . Hypertension   . Hyperlipidemia   . History of kidney stones   . Thyroid disease   . H/O cirrhosis     Past Surgical History  Procedure Laterality Date  . Back surgery    . Total knee arthroplasty Bilateral   . Total abdominal hysterectomy    . Arm surgery    . Toe surgery    . Cholecystectomy      There were no vitals filed for this visit.      Subjective Assessment - 02/08/16 1909    Subjective Pt reported she has been practicing the mini squat sidestepping exercises at her son's pool. Pt feels she can not get the breathing coordination with it   Pain Onset Other (comment)  chronic pain and 3 back surgeries         O: Pt entered/exited the pool via steps with single UE support on rail.  50 ft =1 lap  Exercises performed in 3'6" depth  4 laps walking forward/ backward  4 laps perturbation training applied at waist by PT in four directions for stepping strategies 180 degree turns with minimal steps and balance x 4 sets  2 laps sidestepping + squat  30 reps minisquat + shoulder extension with noodle (excessive cuing to minimize posterior COM)  Stretches:  quad, hip flexor, piriformis   floating on noodles                            PT Long Term Goals - 12/26/15 1642    PT LONG TERM GOAL #1   Title Patient will reduce timed up and go to <11 seconds to reduce fall risk and demonstrate improved transfer/gait ability.   Time 8   Period Weeks   Status New   PT LONG TERM GOAL #2   Title Patient will be independent in home exercise program to improve strength/mobility for better functional independence with ADLs.   Time 8   Period Weeks   Status New   PT LONG TERM GOAL #3   Title Patient will increase six minute walk test distance to >1000 for progression to community ambulator and improve gait ability   Time 8   Period Weeks   Status New   PT LONG TERM GOAL #4   Title Patient will report a worst pain of 3/10 on VAS in             to improve tolerance with ADLs and reduced symptoms with activities.    Time  8   Period Weeks   Status New               Plan - 02/08/16 1910    Clinical Impression Statement Pt   Rehab Potential Fair   Clinical Impairments Affecting Rehab Potential obesity, chronic back pain    PT Frequency 2x / week   PT Duration 8 weeks   PT Treatment/Interventions Manual techniques;Gait training;Functional mobility training;Therapeutic activities;Therapeutic exercise;Balance training;Aquatic Therapy;Biofeedback;Moist Heat;Cryotherapy;Electrical Stimulation   PT Next Visit Plan manual therapy to right hip and therapeutic exercise   Consulted and Agree with Plan of Care Patient      Patient will benefit from skilled therapeutic intervention in order to improve the following deficits and impairments:  Abnormal gait, Pain, Difficulty walking, Decreased balance, Decreased strength, Obesity, Impaired flexibility, Decreased activity tolerance, Decreased endurance, Decreased range of motion, Decreased mobility, Impaired sensation  Visit Diagnosis: Left-sided low back pain with left-sided  sciatica     Problem List Patient Active Problem List   Diagnosis Date Noted  . Urinary incontinence 12/07/2015  . Aortic heart murmur on examination 12/06/2015  . Incontinence in female 12/06/2015  . Gait instability 12/06/2015  . Shortness of breath 12/06/2015  . Memory loss 05/03/2015  . Allergic rhinitis 05/02/2015  . Blood glucose elevated 05/02/2015  . HLD (hyperlipidemia) 05/02/2015  . Benign hypertension 05/02/2015  . Adult hypothyroidism 05/02/2015  . Malaise and fatigue 05/02/2015  . Billowing mitral valve 05/02/2015  . NASH (nonalcoholic steatohepatitis) 05/02/2015  . Obesity 05/02/2015  . Arthritis, degenerative 05/02/2015  . Disorder of peripheral nervous system ([place]) 05/02/2015  . Anxiety 01/27/2015  . Hepatic cirrhosis ([place]) 01/12/2015  . Hammer toe 11/18/2014  . Hallux rigidus of right foot 11/18/2014  . Hallux rigidus of left foot 11/18/2014  . Arthritis of ankle or foot, degenerative 11/18/2014  . Pain in shoulder 10/26/2014  . Peripheral nerve disease ([place]) 10/22/2012  . Arthritis of knee, degenerative 10/16/2011    Jerl Mina ,PT, DPT, E-RYT  02/08/2016, 7:11 PM  [place] MAIN [facility] SERVICES [address] [place], [place], [address] Phone: [phone]   Fax:  [phone]  Name: Anita Bennett MRN: [id number] Date of Birth: 09-07-1940

## 2016-02-09 ENCOUNTER — Encounter: Payer: Medicare Other | Admitting: Physical Therapy

## 2016-02-11 ENCOUNTER — Ambulatory Visit
Admission: RE | Admit: 2016-02-11 | Discharge: 2016-02-11 | Disposition: A | Discharge status: Home / Self Care | Payer: Medicare Other | Source: Ambulatory Visit | Attending: Neurology | Admitting: Neurology

## 2016-02-11 DIAGNOSIS — R2689 Other abnormalities of gait and mobility: Secondary | ICD-10-CM | POA: Diagnosis not present

## 2016-02-11 DIAGNOSIS — G3184 Mild cognitive impairment, so stated: Secondary | ICD-10-CM | POA: Insufficient documentation

## 2016-02-11 DIAGNOSIS — R413 Other amnesia: Secondary | ICD-10-CM | POA: Diagnosis not present

## 2016-02-11 DIAGNOSIS — R9089 Other abnormal findings on diagnostic imaging of central nervous system: Secondary | ICD-10-CM | POA: Insufficient documentation

## 2016-02-16 ENCOUNTER — Ambulatory Visit: Payer: Medicare Other | Admitting: Physical Therapy

## 2016-02-16 DIAGNOSIS — M5442 Lumbago with sciatica, left side: Secondary | ICD-10-CM | POA: Diagnosis not present

## 2016-02-17 ENCOUNTER — Other Ambulatory Visit: Payer: Self-pay | Admitting: Family Medicine

## 2016-02-17 DIAGNOSIS — F419 Anxiety disorder, unspecified: Secondary | ICD-10-CM

## 2016-02-17 NOTE — Telephone Encounter (Signed)
Dr. Maralyn Sago pt.   Thanks,   -Mickel Baas

## 2016-02-17 NOTE — Therapy (Signed)
[place] MAIN [facility] SERVICES [address] [place], [place], [address] Phone: [phone]   Fax:  [phone]  Physical Therapy Treatment  Patient Details  Name: Anita Bennett MRN: [id number] Date of Birth: 07/10/1941 Referring Provider: Margarita Rana   Encounter Date: 02/16/2016      PT End of Session - 02/17/16 1622    Visit Number 9   Number of Visits 17   Date for PT Re-Evaluation 02/27/16   PT Start Time 0973   PT Stop Time 1100   PT Time Calculation (min) 45 min   Activity Tolerance Patient tolerated treatment well;No increased pain   Behavior During Therapy [facility] for tasks assessed/performed      Past Medical History:  Diagnosis Date  . Arrhythmia   . H/O cirrhosis   . H/O knee surgery    2011 [facility] knee (replacement)  . Heart murmur   . History of kidney stones   . History of toe surgery    12/01/14 [facility]  . Hyperlipidemia   . Hypertension   . Thyroid disease     Past Surgical History:  Procedure Laterality Date  . arm surgery    . BACK SURGERY    . CHOLECYSTECTOMY    . TOE SURGERY    . TOTAL ABDOMINAL HYSTERECTOMY    . TOTAL KNEE ARTHROPLASTY Bilateral     There were no vitals filed for this visit.      Subjective Assessment - 02/17/16 1621    Subjective Pt    Pain Onset Other (comment)  chronic pain and 3 back surgeries                     Adult Aquatic Therapy - 02/17/16 1622      Aquatic Therapy Subjective   Subjective Pt had no complaints                         PT Long Term Goals - 12/26/15 1642      PT LONG TERM GOAL #1   Title Patient will reduce timed up and go to <11 seconds to reduce fall risk and demonstrate improved transfer/gait ability.   Time 8   Period Weeks   Status New     PT LONG TERM GOAL #2   Title Patient will be independent in home exercise program to improve strength/mobility for better functional independence with ADLs.   Time 8   Period Weeks   Status New     PT LONG TERM GOAL #3   Title Patient will increase six minute walk test distance to >1000 for progression to community ambulator and improve gait ability   Time 8   Period Weeks   Status New     PT LONG TERM GOAL #4   Title Patient will report a worst pain of 3/10 on VAS in             to improve tolerance with ADLs and reduced symptoms with activities.    Time 8   Period Weeks   Status New               Plan - 02/17/16 1622    Clinical Impression Statement Pt   Rehab Potential Fair   Clinical Impairments Affecting Rehab Potential obesity, chronic back pain    PT Frequency 2x / week   PT Duration 8 weeks   PT Treatment/Interventions Manual techniques;Gait training;Functional mobility training;Therapeutic activities;Therapeutic exercise;Balance training;Aquatic  Therapy;Biofeedback;Moist Heat;Cryotherapy;Nurse, adult and Agree with Plan of Care Patient      Patient will benefit from skilled therapeutic intervention in order to improve the following deficits and impairments:  Abnormal gait, Pain, Difficulty walking, Decreased balance, Decreased strength, Obesity, Impaired flexibility, Decreased activity tolerance, Decreased endurance, Decreased range of motion, Decreased mobility, Impaired sensation  Visit Diagnosis: Left-sided low back pain with left-sided sciatica     Problem List Patient Active Problem List   Diagnosis Date Noted  . Urinary incontinence 12/07/2015  . Aortic heart murmur on examination 12/06/2015  . Incontinence in female 12/06/2015  . Gait instability 12/06/2015  . Shortness of breath 12/06/2015  . Memory loss 05/03/2015  . Allergic rhinitis 05/02/2015  . Blood glucose elevated 05/02/2015  . HLD (hyperlipidemia) 05/02/2015  . Benign hypertension 05/02/2015  . Adult hypothyroidism 05/02/2015  . Malaise and fatigue 05/02/2015  . Billowing mitral valve 05/02/2015  . NASH (nonalcoholic  steatohepatitis) 05/02/2015  . Obesity 05/02/2015  . Arthritis, degenerative 05/02/2015  . Disorder of peripheral nervous system ([place]) 05/02/2015  . Anxiety 01/27/2015  . Hepatic cirrhosis ([place]) 01/12/2015  . Hammer toe 11/18/2014  . Hallux rigidus of right foot 11/18/2014  . Hallux rigidus of left foot 11/18/2014  . Arthritis of ankle or foot, degenerative 11/18/2014  . Pain in shoulder 10/26/2014  . Peripheral nerve disease ([place]) 10/22/2012  . Arthritis of knee, degenerative 10/16/2011    Jerl Mina ,PT, DPT, E-RYT  02/17/2016, 4:23 PM  [place] MAIN [facility] SERVICES [address] [place], [place], [address] Phone: [phone]   Fax:  [phone]  Name: LEIGHANNE ADOLPH MRN: [id number] Date of Birth: 11/12/1940

## 2016-02-21 ENCOUNTER — Ambulatory Visit: Payer: Medicare Other | Admitting: Physical Therapy

## 2016-02-23 ENCOUNTER — Ambulatory Visit: Payer: Medicare Other | Attending: Family Medicine | Admitting: Physical Therapy

## 2016-02-23 DIAGNOSIS — M25551 Pain in right hip: Secondary | ICD-10-CM

## 2016-02-23 DIAGNOSIS — R279 Unspecified lack of coordination: Secondary | ICD-10-CM

## 2016-02-23 DIAGNOSIS — M5442 Lumbago with sciatica, left side: Secondary | ICD-10-CM

## 2016-02-23 NOTE — Therapy (Signed)
[place] MAIN [facility] SERVICES [address] [place], [place], [address] Phone: [phone]   Fax:  [phone]  Physical Therapy Treatment  Patient Details  Name: Anita Bennett MRN: [id number] Date of Birth: 12-May-1941 Referring Provider: Margarita Rana   Encounter Date: 02/23/2016      PT End of Session - 02/23/16 1230    Visit Number 10   Date for PT Re-Evaluation 02/27/16   PT Start Time 0850   PT Stop Time 0930   PT Time Calculation (min) 40 min   Activity Tolerance Patient tolerated treatment well;No increased pain   Behavior During Therapy WFL for tasks assessed/performed      Past Medical History:  Diagnosis Date  . Arrhythmia   . H/O cirrhosis   . H/O knee surgery    2011 [facility] knee (replacement)  . Heart murmur   . History of kidney stones   . History of toe surgery    12/01/14 [facility]  . Hyperlipidemia   . Hypertension   . Thyroid disease     Past Surgical History:  Procedure Laterality Date  . arm surgery    . BACK SURGERY    . CHOLECYSTECTOMY    . TOE SURGERY    . TOTAL ABDOMINAL HYSTERECTOMY    . TOTAL KNEE ARTHROPLASTY Bilateral     There were no vitals filed for this visit.      Subjective Assessment - 02/23/16 1224    Subjective Pt reported she notices pulling and a lump feeling in her R groin when she turns over in bed. Pt has been practicing her exercises. Pt has f/u with her MD re: brain MRI report and will be seeing a urologist re: her bladder issues.    Pain Onset Other (comment)  chronic pain and 3 back surgeries                     Adult Aquatic Therapy - 02/23/16 1230      Aquatic Therapy Subjective   Subjective Pt had no complaints        O: Pt entered/exited the pool via steps with single UE support on rail.  50 ft =1 lap  Exercises performed in 3'6" depth  4 laps are walking with BUE on noodles, shoulder retraction 2 laps sidestepping  Stretches :  Spinal traction  with wall, hip flexion, trunk rotation.  Added simulated task of reaching overhead into cabinets with semi tandem stance  10 reps on both sides (pt will more LOB when R LE is back)   Pt reported back pain with exercise but it was relieved with spinal traction stretch by rail and cues for pelvic tilt   2 laps are walking (zig zag pattern with each leg with lateral stepping strategy) BUE on noodles   2 laps of floating relaxation with support of PT                PT Long Term Goals - 02/23/16 1231      PT LONG TERM GOAL #1   Title Patient will reduce timed up and go to <11 seconds to reduce fall risk and demonstrate improved transfer/gait ability.   Time 8   Period Weeks   Status On-going     PT LONG TERM GOAL #2   Title Patient will be independent in home exercise program to improve strength/mobility for better functional independence with ADLs.   Time 8   Period Weeks   Status On-going  PT LONG TERM GOAL #3   Title Patient will increase six minute walk test distance to >1000 for progression to community ambulator and improve gait ability   Time 8   Period Weeks   Status On-going     PT LONG TERM GOAL #4   Title Patient will report a worst pain of 3/10 on VAS in             to improve tolerance with ADLs and reduced symptoms with activities.    Time 8   Period Weeks   Status On-going     PT LONG TERM GOAL #5   Title Pt will report no LOB with semi tadem exercise that simulates cabinet overhead reaching (RLE back , RUE overhead) in order to minimize risk for falls.                Plan - 02/23/16 1231    Clinical Impression Statement Pt is progressing well towards her goals. Pt has responded well to aquatic therapy and demo'd increased strength/ ROM but continues to show balance deficits and report of R hip pain. Pt will be seeing a pelvic health PT within the same dept to address her c/o R groin pain.  Pt will continue with land exercises to advance  towards her goals because pt has reported changes with her bladder which is a contraindication to aquatic therapy and also because pt would benefit from manual Tx and balance training on land.  PT has read pt's latest MRI report and is aware of the changes related to her brain and therefore, balance training is incorporated into her POC.  Pt will continue to benefit from skilled PT.     Rehab Potential Fair   Clinical Impairments Affecting Rehab Potential obesity, chronic back pain    PT Frequency 1x / week   PT Duration 12 weeks   PT Treatment/Interventions Manual techniques;Gait training;Functional mobility training;Therapeutic activities;Therapeutic exercise;Balance training;Aquatic Therapy;Biofeedback;Moist Heat;Cryotherapy;Electrical Stimulation;Neuromuscular re-education;ADLs/Self Care Home Management;Scar mobilization;Manual lymph drainage;Taping  balance training   Consulted and Agree with Plan of Care Patient      Patient will benefit from skilled therapeutic intervention in order to improve the following deficits and impairments:  Abnormal gait, Pain, Difficulty walking, Decreased balance, Decreased strength, Obesity, Impaired flexibility, Decreased activity tolerance, Decreased endurance, Decreased range of motion, Decreased mobility, Impaired sensation  Visit Diagnosis: Left-sided low back pain with left-sided sciatica  Pain in right hip  Unspecified lack of coordination     Problem List Patient Active Problem List   Diagnosis Date Noted  . Urinary incontinence 12/07/2015  . Aortic heart murmur on examination 12/06/2015  . Incontinence in female 12/06/2015  . Gait instability 12/06/2015  . Shortness of breath 12/06/2015  . Memory loss 05/03/2015  . Allergic rhinitis 05/02/2015  . Blood glucose elevated 05/02/2015  . HLD (hyperlipidemia) 05/02/2015  . Benign hypertension 05/02/2015  . Adult hypothyroidism 05/02/2015  . Malaise and fatigue 05/02/2015  . Billowing  mitral valve 05/02/2015  . NASH (nonalcoholic steatohepatitis) 05/02/2015  . Obesity 05/02/2015  . Arthritis, degenerative 05/02/2015  . Disorder of peripheral nervous system ([place]) 05/02/2015  . Anxiety 01/27/2015  . Hepatic cirrhosis ([place]) 01/12/2015  . Hammer toe 11/18/2014  . Hallux rigidus of right foot 11/18/2014  . Hallux rigidus of left foot 11/18/2014  . Arthritis of ankle or foot, degenerative 11/18/2014  . Pain in shoulder 10/26/2014  . Peripheral nerve disease ([place]) 10/22/2012  . Arthritis of knee, degenerative 10/16/2011    Jerl Mina ,  PT, DPT, E-RYT  02/23/2016, 12:51 PM  [place] MAIN [facility] SERVICES [address] [place], [place], [address] Phone: [phone]   Fax:  [phone]  Name: Anita Bennett MRN: [id number] Date of Birth: June 27, 1941

## 2016-02-28 ENCOUNTER — Ambulatory Visit: Payer: Medicare Other | Admitting: Physical Therapy

## 2016-02-28 DIAGNOSIS — M25551 Pain in right hip: Secondary | ICD-10-CM | POA: Diagnosis not present

## 2016-02-28 DIAGNOSIS — R279 Unspecified lack of coordination: Secondary | ICD-10-CM | POA: Diagnosis not present

## 2016-02-28 DIAGNOSIS — M5442 Lumbago with sciatica, left side: Secondary | ICD-10-CM

## 2016-02-28 NOTE — Therapy (Signed)
[place] MAIN [facility] SERVICES [address] [place], [place], [address] Phone: [phone]   Fax:  [phone]  Physical Therapy Treatment  Patient Details  Name: Anita Bennett MRN: [id number] Date of Birth: 1941-07-19 Referring Provider: Margarita Rana   Encounter Date: 02/28/2016      PT End of Session - 02/28/16 1748    Visit Number 11   Number of Visits 17   Authorization Type 1/10   PT Start Time 2952   PT Stop Time 1750   PT Time Calculation (min) 45 min   Activity Tolerance Patient tolerated treatment well;No increased pain   Behavior During Therapy WFL for tasks assessed/performed      Past Medical History:  Diagnosis Date  . Arrhythmia   . H/O cirrhosis   . H/O knee surgery    2011 [facility] knee (replacement)  . Heart murmur   . History of kidney stones   . History of toe surgery    12/01/14 [facility]  . Hyperlipidemia   . Hypertension   . Thyroid disease     Past Surgical History:  Procedure Laterality Date  . arm surgery    . BACK SURGERY    . CHOLECYSTECTOMY    . TOE SURGERY    . TOTAL ABDOMINAL HYSTERECTOMY    . TOTAL KNEE ARTHROPLASTY Bilateral     There were no vitals filed for this visit.      Subjective Assessment - 02/28/16 1709    Subjective Pt reported R back pain above the iliac crest. Pt reported she is not going to the bathroom as often after learning the exercises from the pool. Pt did experience pain over the bladder the other day which caused her to not make it to the bathroom in time and she had a bowel accident.             [facility] PT Assessment - 02/28/16 1710      Posture/Postural Control   Posture Comments minor cuing with deep core level 2 exercise     AROM   Overall AROM Comments L/R sidebend with R LBP!, L > R      Palpation   Spinal mobility significant mm tensions along B parapsinals R >L    SI assessment  R PSIS more anterior, R iliac crest lower    Palpation comment fascial  restrictions over abdominal scar      Bed Mobility   Bed Mobility --  breathhholding w/ strain/pain (pre Tx), no pain (post Tx)                      [facility] Adult PT Treatment/Exercise - 02/28/16 1710      Therapeutic Activites    Therapeutic Activities --  see pt instructions     Neuro Re-ed    Neuro Re-ed Details  see pt instructions     Manual Therapy   Manual therapy comments light gentle scar massage                 PT Education - 02/28/16 1748    Education provided Yes   Education Details HEP   Person(s) Educated Patient   Methods Explanation;Demonstration;Tactile cues;Verbal cues;Handout   Comprehension Returned demonstration;Verbalized understanding             PT Long Term Goals - 02/23/16 1231      PT LONG TERM GOAL #1   Title Patient will reduce timed up and go to <11 seconds  to reduce fall risk and demonstrate improved transfer/gait ability.   Time 8   Period Weeks   Status On-going     PT LONG TERM GOAL #2   Title Patient will be independent in home exercise program to improve strength/mobility for better functional independence with ADLs.   Time 8   Period Weeks   Status On-going     PT LONG TERM GOAL #3   Title Patient will increase six minute walk test distance to >1000 for progression to community ambulator and improve gait ability   Time 8   Period Weeks   Status On-going     PT LONG TERM GOAL #4   Title Patient will report a worst pain of 3/10 on VAS in             to improve tolerance with ADLs and reduced symptoms with activities.    Time 8   Period Weeks   Status On-going     PT LONG TERM GOAL #5   Title Pt will report no LOB with semi tadem exercise that simulates cabinet overhead reaching (RLE back , RUE overhead) in order to minimize risk for falls.                Plan - 02/28/16 1749    Clinical Impression Statement After today's session, pt reported "much better" in R hip by 70% and her mm cramps  went away. Pt showed proper breathing coordination and deep core exercise technique. Pt showed increased sidebend ROM of the spine B without pain, more upright gait, and less breathholding with bed mobility  following session.  Pt tolerated light gentle scar massage without complaints. Pt will continue to benefit skilled PT.    Rehab Potential Fair   Clinical Impairments Affecting Rehab Potential obesity, chronic back pain    PT Frequency 1x / week   PT Duration 12 weeks   PT Treatment/Interventions Manual techniques;Gait training;Functional mobility training;Therapeutic activities;Therapeutic exercise;Balance training;Aquatic Therapy;Biofeedback;Moist Heat;Cryotherapy;Electrical Stimulation;Neuromuscular re-education;ADLs/Self Care Home Management;Scar mobilization;Manual lymph drainage;Taping  balance training   Consulted and Agree with Plan of Care Patient      Patient will benefit from skilled therapeutic intervention in order to improve the following deficits and impairments:  Abnormal gait, Pain, Difficulty walking, Decreased balance, Decreased strength, Obesity, Impaired flexibility, Decreased activity tolerance, Decreased endurance, Decreased range of motion, Decreased mobility, Impaired sensation  Visit Diagnosis: Left-sided low back pain with left-sided sciatica  Pain in right hip  Unspecified lack of coordination     Problem List Patient Active Problem List   Diagnosis Date Noted  . Urinary incontinence 12/07/2015  . Aortic heart murmur on examination 12/06/2015  . Incontinence in female 12/06/2015  . Gait instability 12/06/2015  . Shortness of breath 12/06/2015  . Memory loss 05/03/2015  . Allergic rhinitis 05/02/2015  . Blood glucose elevated 05/02/2015  . HLD (hyperlipidemia) 05/02/2015  . Benign hypertension 05/02/2015  . Adult hypothyroidism 05/02/2015  . Malaise and fatigue 05/02/2015  . Billowing mitral valve 05/02/2015  . NASH (nonalcoholic steatohepatitis)  05/02/2015  . Obesity 05/02/2015  . Arthritis, degenerative 05/02/2015  . Disorder of peripheral nervous system ([place]) 05/02/2015  . Anxiety 01/27/2015  . Hepatic cirrhosis ([place]) 01/12/2015  . Hammer toe 11/18/2014  . Hallux rigidus of right foot 11/18/2014  . Hallux rigidus of left foot 11/18/2014  . Arthritis of ankle or foot, degenerative 11/18/2014  . Pain in shoulder 10/26/2014  . Peripheral nerve disease ([place]) 10/22/2012  . Arthritis of knee, degenerative 10/16/2011  Jerl Mina ,PT, DPT, E-RYT  02/28/2016, 6:05 PM  [place] MAIN [facility] SERVICES [address] [place], [place], [address] Phone: [phone]   Fax:  [phone]  Name: Anita Bennett MRN: [id number] Date of Birth: [birth year]

## 2016-02-28 NOTE — Patient Instructions (Addendum)
1. Stretches: sidebend seated and standing 10 x each side   2. Deep core strengthening You are now ready to begin training the deep core muscles system: diaphragm, transverse abdominis, pelvic floor . These muscles must work together as a team.       The key to these exercises to train the brain to coordinate the timing of these muscles and to have them turn on for long periods of time to hold you upright against gravity (especially important if you are on your feet all day).These muscles are postural muscles and play a role stabilizing your spine and bodyweight. By doing these repetitions slowly and correctly instead of doing crunches, you will achieve a flatter belly without a lower pooch. You are also placing your spine in a more neutral position and breathing properly which in turn, decreases your risk for problems related to your pelvic floor, abdominal, and low back such as pelvic organ prolapse, hernias, diastasis recti (separation of superficial muscles), disk herniations, spinal fractures. These exercises set a solid foundation for you to later progress to resistance/ strength training with therabands and weights and return to other typical fitness exercises with a stronger deeper core.  Make sure your back is lengthened and laying comfortable before you start.  Do level 1 10 x --> level 2 10 x (repeat for 3 sets) in the morning and night     3.  Avoid holding your breath when Getting out of the chair:  Scoot to front half of thethe chair Heels under knees, nose over toes  Inhale like you are smelling roses Exhale to stand   Breathing to roll over in bed

## 2016-02-29 NOTE — Telephone Encounter (Signed)
error 

## 2016-03-01 ENCOUNTER — Ambulatory Visit: Payer: Medicare Other | Admitting: Physical Therapy

## 2016-03-06 ENCOUNTER — Ambulatory Visit: Payer: Medicare Other | Admitting: Physical Therapy

## 2016-03-08 ENCOUNTER — Ambulatory Visit: Payer: Medicare Other | Admitting: Physical Therapy

## 2016-03-15 ENCOUNTER — Ambulatory Visit: Payer: Medicare Other | Admitting: Physical Therapy

## 2016-03-15 DIAGNOSIS — M25551 Pain in right hip: Secondary | ICD-10-CM | POA: Diagnosis not present

## 2016-03-15 DIAGNOSIS — M5442 Lumbago with sciatica, left side: Secondary | ICD-10-CM

## 2016-03-15 DIAGNOSIS — R279 Unspecified lack of coordination: Secondary | ICD-10-CM | POA: Diagnosis not present

## 2016-03-15 NOTE — Patient Instructions (Addendum)
Alternate sitting in recliner for 30 min, and hard chair with proper sitting posture 30 min   Spend less time in the recliner overall.  Take sit to stand breaks during very commercial 10 reps   ______________  Morning routine: Keep spine flexible by moving each joint from ankle to neck (handout)   Deep core exercise: inhale (do nothing) ,   knee out one at a time during exhale  30 reps   Open book 15 reps, (handout)   ** remember to roll in bed by breathing and not to hold your breath

## 2016-03-15 NOTE — Therapy (Signed)
[place] MAIN [facility] SERVICES [address] [place], [place], [address] Phone: [phone]   Fax:  [phone]  Physical Therapy Treatment  Patient Details  Name: Anita Bennett MRN: [id number] Date of Birth: [birth year] Referring Provider: Margarita Rana   Encounter Date: 03/15/2016      PT End of Session - 03/15/16 1410    Visit Number 12   Authorization Type 2/10   PT Start Time 0045   PT Stop Time 1430   PT Time Calculation (min) 55 min   Activity Tolerance Patient tolerated treatment well;No increased pain      Past Medical History:  Diagnosis Date  . Arrhythmia   . H/O cirrhosis   . H/O knee surgery    2011 [facility] knee (replacement)  . Heart murmur   . History of kidney stones   . History of toe surgery    12/01/14 [facility]  . Hyperlipidemia   . Hypertension   . Thyroid disease     Past Surgical History:  Procedure Laterality Date  . arm surgery    . BACK SURGERY    . CHOLECYSTECTOMY    . TOE SURGERY    . TOTAL ABDOMINAL HYSTERECTOMY    . TOTAL KNEE ARTHROPLASTY Bilateral     There were no vitals filed for this visit.      Subjective Assessment - 03/15/16 1340    Subjective Pt reported her R hip has improved by 80% since last session and it has not bothered when she rolled in bed. Two days ago, pt's back was bothering her (center area) after she suffered a migraine.  Despite ice and heat, the pain still persists.  With movements, pain is sharp at 8/10. Without movements, pain is 4/10. Pt performed her deep core exercise in her recliner and she spends 3 hrs watching TV in her recliner and then experiences pain with getting out of her recliner.               [facility] PT Assessment - 03/15/16 1342      Observation/Other Assessments   Observations p! with pelvic tilts, sitting   Skin Integrity       Palpation   SI assessment  L PSIS more posterior and with pain upon palpation, pain with hip ext in sidelying (post Tx,  no pain, increased hip ROM )    Palpation comment decreased fascial restrictions      Bed Mobility   Bed Mobility --  cramping in her R back, breathholding                      [facility] Adult PT Treatment/Exercise - 03/15/16 1342      Therapeutic Activites    Therapeutic Activities --  sit<>stand, sides tepping exercises during commerical breaks     Neuro Re-ed    Neuro Re-ed Details  coordination of exhalation with bed mobility to minimize cramping her back   reviewed deep core level 2     Manual Therapy   Manual therapy comments L LE long axis distraction, MWM with hip flexion, ext, inferior mobs along L lateral edge of sacrum                 PT Education - 03/15/16 1409    Education provided Yes   Education Details HEP, compliance to HEP, minimizing time in recliner, start a morning flexibility routine    Person(s) Educated Patient   Methods Explanation;Demonstration;Tactile cues;Verbal cues;Handout  Comprehension Returned demonstration;Verbalized understanding             PT Long Term Goals - 02/23/16 1231      PT LONG TERM GOAL #1   Title Patient will reduce timed up and go to <11 seconds to reduce fall risk and demonstrate improved transfer/gait ability.   Time 8   Period Weeks   Status On-going     PT LONG TERM GOAL #2   Title Patient will be independent in home exercise program to improve strength/mobility for better functional independence with ADLs.   Time 8   Period Weeks   Status On-going     PT LONG TERM GOAL #3   Title Patient will increase six minute walk test distance to >1000 for progression to community ambulator and improve gait ability   Time 8   Period Weeks   Status On-going     PT LONG TERM GOAL #4   Title Patient will report a worst pain of 3/10 on VAS in             to improve tolerance with ADLs and reduced symptoms with activities.    Time 8   Period Weeks   Status On-going     PT LONG TERM GOAL #5   Title  Pt will report no LOB with semi tadem exercise that simulates cabinet overhead reaching (RLE back , RUE overhead) in order to minimize risk for falls.                Plan - 03/15/16 1410    Clinical Impression Statement Pt reported her R hip pain has improved by 80% following the abdominal scar release from last session. Following Tx today, pt 's LBP resolved and she demo'd less cramping with bed mobility and no LBP with pelvic tilts in sitting position and with mini squats. Pt was educated on maintaining a flexibility routine to decrease midback tensions,  maintain other exercises from her previous PT sessions for functional mobility, and breathing with bed mobility. Pt demo'd correctly and voiced understanding.  Pt has been placed on PT's cancellation list to get an appt within the next month for one more visit before d/c.      Rehab Potential Fair   PT Frequency 1x / week   PT Duration 12 weeks   PT Treatment/Interventions Manual techniques;Gait training;Functional mobility training;Therapeutic activities;Therapeutic exercise;Balance training;Aquatic Therapy;Biofeedback;Moist Heat;Cryotherapy;Electrical Stimulation;Neuromuscular re-education;ADLs/Self Care Home Management;Scar mobilization;Manual lymph drainage;Taping   Consulted and Agree with Plan of Care Patient      Patient will benefit from skilled therapeutic intervention in order to improve the following deficits and impairments:     Visit Diagnosis: Left-sided low back pain with left-sided sciatica  Pain in right hip  Unspecified lack of coordination     Problem List Patient Active Problem List   Diagnosis Date Noted  . Urinary incontinence 12/07/2015  . Aortic heart murmur on examination 12/06/2015  . Incontinence in female 12/06/2015  . Gait instability 12/06/2015  . Shortness of breath 12/06/2015  . Memory loss 05/03/2015  . Allergic rhinitis 05/02/2015  . Blood glucose elevated 05/02/2015  . HLD  (hyperlipidemia) 05/02/2015  . Benign hypertension 05/02/2015  . Adult hypothyroidism 05/02/2015  . Malaise and fatigue 05/02/2015  . Billowing mitral valve 05/02/2015  . NASH (nonalcoholic steatohepatitis) 05/02/2015  . Obesity 05/02/2015  . Arthritis, degenerative 05/02/2015  . Disorder of peripheral nervous system ([place]) 05/02/2015  . Anxiety 01/27/2015  . Hepatic cirrhosis ([place]) 01/12/2015  . Esmeralda Links  toe 11/18/2014  . Hallux rigidus of right foot 11/18/2014  . Hallux rigidus of left foot 11/18/2014  . Arthritis of ankle or foot, degenerative 11/18/2014  . Pain in shoulder 10/26/2014  . Peripheral nerve disease ([place]) 10/22/2012  . Arthritis of knee, degenerative 10/16/2011    Jerl Mina ,PT, DPT, E-RYT  03/15/2016, 2:28 PM  [place] MAIN [facility] SERVICES [address] [place], [place], [address] Phone: [phone]   Fax:  [phone]  Name: Anita Bennett MRN: [id number] Date of Birth: 1940/10/22

## 2016-03-19 ENCOUNTER — Other Ambulatory Visit: Payer: Self-pay | Admitting: Family Medicine

## 2016-03-20 ENCOUNTER — Ambulatory Visit: Payer: Medicare Other | Admitting: Physical Therapy

## 2016-03-27 ENCOUNTER — Telehealth: Payer: Self-pay | Admitting: Family Medicine

## 2016-03-27 DIAGNOSIS — Z1231 Encounter for screening mammogram for malignant neoplasm of breast: Secondary | ICD-10-CM

## 2016-03-27 NOTE — Telephone Encounter (Signed)
This was a pt of Dr. Venia Minks and is scheduled for her first follow up Dr. Caryn Section on 04/05/16. Pt stated that [facility] won't allow her to schedule her mammogram without an order from Dr. Caryn Section since Dr. Venia Minks is no longer at our practice. Please advise. Thanks TNP

## 2016-03-27 NOTE — Telephone Encounter (Signed)
Patient advised to call [facility] to schedule appt. Mammogram ordered. sd

## 2016-03-27 NOTE — Telephone Encounter (Signed)
Okay to order? Beatryce Colombo Drozdowski, CMA  

## 2016-03-27 NOTE — Telephone Encounter (Signed)
OK to order Mammogram

## 2016-03-28 ENCOUNTER — Encounter: Payer: Self-pay | Admitting: Physical Therapy

## 2016-03-28 DIAGNOSIS — M25551 Pain in right hip: Secondary | ICD-10-CM

## 2016-03-28 DIAGNOSIS — M5442 Lumbago with sciatica, left side: Secondary | ICD-10-CM

## 2016-03-28 DIAGNOSIS — R279 Unspecified lack of coordination: Secondary | ICD-10-CM

## 2016-03-28 NOTE — Therapy (Signed)
[place] MAIN [facility] SERVICES [address] [place], [place], [address] Phone: [phone]   Fax:  [phone]  Patient Details  Name: Anita Bennett MRN: [id number] Date of Birth: 23-Mar-1941 Referring Provider:  No ref. provider found   Encounter Date: 03/28/2016   Discharge Summary   Pt was phoned after not responding to available appt times.  Pt reports she has made 40% improvement with her low back pain. She states her pain comes on with certain positions now. Pt is self-d/c due to upcoming travels out of town. Pt was educated on maintaining HEP in order to continue to maintain the benefits of her rehab. Pt was also educated starting balance training with a new MD order when she is ready to start after she returns from her travels. Pt expressed concerns about her balance and PT reinforced rehab given her recent MRI reports that showed vascular changes to her brain.  Pt voiced understanding.    Jerl Mina ,PT, DPT, E-RYT  03/28/2016, 1:39 PM  [place] MAIN [facility] SERVICES [address] [place], [place], [address] Phone: [phone]   Fax:  [phone]

## 2016-03-30 DIAGNOSIS — D692 Other nonthrombocytopenic purpura: Secondary | ICD-10-CM | POA: Diagnosis not present

## 2016-03-30 DIAGNOSIS — L820 Inflamed seborrheic keratosis: Secondary | ICD-10-CM | POA: Diagnosis not present

## 2016-03-30 DIAGNOSIS — L821 Other seborrheic keratosis: Secondary | ICD-10-CM | POA: Diagnosis not present

## 2016-03-30 DIAGNOSIS — D485 Neoplasm of uncertain behavior of skin: Secondary | ICD-10-CM | POA: Diagnosis not present

## 2016-03-30 DIAGNOSIS — B078 Other viral warts: Secondary | ICD-10-CM | POA: Diagnosis not present

## 2016-04-03 ENCOUNTER — Ambulatory Visit: Payer: Medicare Other | Admitting: Family Medicine

## 2016-04-03 DIAGNOSIS — R3915 Urgency of urination: Secondary | ICD-10-CM | POA: Diagnosis not present

## 2016-04-05 ENCOUNTER — Ambulatory Visit (INDEPENDENT_AMBULATORY_CARE_PROVIDER_SITE_OTHER): Payer: Medicare Other | Admitting: Family Medicine

## 2016-04-05 ENCOUNTER — Encounter: Payer: Self-pay | Admitting: Family Medicine

## 2016-04-05 VITALS — BP 146/70 | HR 68 | Temp 98.6°F | Resp 16 | Wt 228.0 lb

## 2016-04-05 DIAGNOSIS — Z23 Encounter for immunization: Secondary | ICD-10-CM | POA: Diagnosis not present

## 2016-04-05 DIAGNOSIS — I10 Essential (primary) hypertension: Secondary | ICD-10-CM | POA: Diagnosis not present

## 2016-04-05 DIAGNOSIS — E038 Other specified hypothyroidism: Secondary | ICD-10-CM

## 2016-04-05 NOTE — Patient Instructions (Signed)
Please call the [facility] [phone]) to schedule a routine screening mammogram.

## 2016-04-05 NOTE — Progress Notes (Signed)
Patient: Anita Bennett Female    DOB: 19-Nov-1940   75 y.o.   MRN: [id number] Visit Date: 04/05/2016  Today's Provider: Lelon Huh, MD   Chief Complaint  Patient presents with  . Hypertension    follow up  . Hypothyroidism    follow up  . Hyperlipidemia    follow up   Subjective:    HPI  This is a previous patient of Dr. Venia Minks present today as new patient to me to establish care and follow up on chronic medical problems.    Hypertension, follow-up:  BP Readings from Last 3 Encounters:  12/06/15 (!) 145/82  12/02/15 134/82  10/14/15 136/72    She was last seen for hypertension 11 months ago.  BP at that visit was 160/78. Management since that visit includes no changes. She reports good compliance with treatment. She is not having side effects.  She is not exercising. She is not adherent to low salt diet.   Outside blood pressures are not being checked. She is experiencing none.  Patient denies chest pain, chest pressure/discomfort, claudication, dyspnea, exertional chest pressure/discomfort, fatigue, irregular heart beat, lower extremity edema, near-syncope, orthopnea, palpitations, paroxysmal nocturnal dyspnea, syncope and tachypnea.   Cardiovascular risk factors include advanced age (older than 33 for men, 37 for women), hypertension and sedentary lifestyle.  Use of agents associated with hypertension: NSAIDS.     Weight trend: increasing steadily Wt Readings from Last 3 Encounters:  12/06/15 224 lb 4 oz (101.7 kg)  12/02/15 226 lb (102.5 kg)  10/14/15 224 lb (101.6 kg)    Current diet: in general, an "unhealthy" diet  ------------------------------------------------------------------------ Follow up Hypothyroidism:  Patient was last seen for this problem 4 months ago by Dr. Venia Minks and no changes were made. Patient reports good compliance with treatment and good tolerance.   Follow up Anxiety:  Patient was last seen 8 months ago and no changes  were made. Patient reports good compliance with treatment, good tolerance and good symptom control.     Allergies  Allergen Reactions  . Epinephrine     Heart race  . No Known Allergies   . Promethazine Hcl     hyperactivity  . Statins      Current Outpatient Prescriptions:  .  acetaminophen (TYLENOL) 500 MG tablet, Take 500 mg by mouth every 6 (six) hours as needed., Disp: , Rfl:  .  ALPRAZolam (XANAX) 1 MG tablet, Take 1 tablet (1 mg total) by mouth daily as needed for anxiety., Disp: 30 tablet, Rfl: 5 .  aspirin 81 MG tablet, Take 81 mg by mouth as needed for pain., Disp: , Rfl:  .  levothyroxine (SYNTHROID, LEVOTHROID) 100 MCG tablet, take 1 tablet by mouth once daily, Disp: 90 tablet, Rfl: 1 .  metoprolol succinate (TOPROL-XL) 25 MG 24 hr tablet, take 1 tablet by mouth once daily, Disp: 90 tablet, Rfl: 3 .  PARoxetine (PAXIL) 20 MG tablet, take 1 tablet by mouth once daily, Disp: 90 tablet, Rfl: 0 .  rifaximin (XIFAXAN) 550 MG TABS tablet, Take 1 tablet by mouth 2 (two) times daily., Disp: , Rfl:  .  vitamin B-12 (CYANOCOBALAMIN) 1000 MCG tablet, Take 1 tablet (1,000 mcg total) by mouth daily., Disp: 1 tablet, Rfl: 0  Current Facility-Administered Medications:  .  levalbuterol (XOPENEX) nebulizer solution 1.25 mg, 1.25 mg, Nebulization, Once, Margarita Rana, MD  Review of Systems  Constitutional: Negative for appetite change, chills, fatigue and fever.  Respiratory: Negative for chest  tightness and shortness of breath.   Cardiovascular: Negative for chest pain and palpitations.  Gastrointestinal: Negative for abdominal pain, nausea and vomiting.  Musculoskeletal: Positive for joint swelling.  Neurological: Negative for dizziness and weakness.       Off balance when walking    Social History  Substance Use Topics  . Smoking status: Never Smoker  . Smokeless tobacco: Never Used  . Alcohol use No   Objective:   BP (!) 146/70 (BP Location: Right Arm, Patient Position:  Sitting, Cuff Size: Large)   Pulse 68   Temp 98.6 F (37 C) (Oral)   Resp 16   Wt 228 lb (103.4 kg)   BMI 36.80 kg/m   Physical Exam   General Appearance:    Alert, cooperative, no distress  Eyes:    PERRL, conjunctiva/corneas clear, EOM's intact       Lungs:     Clear to auscultation bilaterally, respirations unlabored  Heart:    Regular rate and rhythm, II/VI systolic murmur.   Neurologic:   Awake, alert, oriented x 3. No apparent focal neurological           defect.           Assessment & Plan:     1. Benign hypertension Well controlled.  Continue current medications.    2. Other specified hypothyroidism Lab Results  Component Value Date   TSH 1.110 12/02/2015   Continue current medications.    3. Need for pneumococcal vaccination  - Pneumococcal polysaccharide vaccine 23-valent greater than or equal to 2yo subcutaneous/IM  4. Need for influenza vaccination  - Flu vaccine HIGH DOSE PF  The entirety of the information documented in the History of Present Illness, Review of Systems and Physical Exam were personally obtained by me. Portions of this information were initially documented by Meyer Cory, CMA and reviewed by me for thoroughness and accuracy.        Lelon Huh, MD  [facility]

## 2016-04-10 DIAGNOSIS — R339 Retention of urine, unspecified: Secondary | ICD-10-CM | POA: Diagnosis not present

## 2016-04-10 DIAGNOSIS — R3911 Hesitancy of micturition: Secondary | ICD-10-CM | POA: Diagnosis not present

## 2016-04-10 DIAGNOSIS — R350 Frequency of micturition: Secondary | ICD-10-CM | POA: Diagnosis not present

## 2016-04-10 DIAGNOSIS — N3941 Urge incontinence: Secondary | ICD-10-CM | POA: Diagnosis not present

## 2016-04-19 ENCOUNTER — Other Ambulatory Visit: Payer: Self-pay | Admitting: Family Medicine

## 2016-04-19 DIAGNOSIS — F419 Anxiety disorder, unspecified: Secondary | ICD-10-CM

## 2016-04-20 DIAGNOSIS — B078 Other viral warts: Secondary | ICD-10-CM | POA: Diagnosis not present

## 2016-04-20 DIAGNOSIS — L821 Other seborrheic keratosis: Secondary | ICD-10-CM | POA: Diagnosis not present

## 2016-04-20 DIAGNOSIS — L570 Actinic keratosis: Secondary | ICD-10-CM | POA: Diagnosis not present

## 2016-04-20 DIAGNOSIS — L814 Other melanin hyperpigmentation: Secondary | ICD-10-CM | POA: Diagnosis not present

## 2016-04-20 NOTE — Telephone Encounter (Signed)
Please call in alprazolam.

## 2016-04-26 DIAGNOSIS — H53143 Visual discomfort, bilateral: Secondary | ICD-10-CM | POA: Diagnosis not present

## 2016-04-26 DIAGNOSIS — H04123 Dry eye syndrome of bilateral lacrimal glands: Secondary | ICD-10-CM | POA: Diagnosis not present

## 2016-04-26 DIAGNOSIS — Z961 Presence of intraocular lens: Secondary | ICD-10-CM | POA: Diagnosis not present

## 2016-04-26 DIAGNOSIS — H43813 Vitreous degeneration, bilateral: Secondary | ICD-10-CM | POA: Diagnosis not present

## 2016-05-01 ENCOUNTER — Ambulatory Visit: Payer: Medicare Other

## 2016-07-04 DIAGNOSIS — N3000 Acute cystitis without hematuria: Secondary | ICD-10-CM | POA: Diagnosis not present

## 2016-07-04 DIAGNOSIS — R339 Retention of urine, unspecified: Secondary | ICD-10-CM | POA: Diagnosis not present

## 2016-07-18 ENCOUNTER — Other Ambulatory Visit: Payer: Self-pay

## 2016-07-18 DIAGNOSIS — E038 Other specified hypothyroidism: Secondary | ICD-10-CM

## 2016-07-18 MED ORDER — LEVOTHYROXINE SODIUM 100 MCG PO TABS: 100.0000 ug | ORAL_TABLET | Freq: Every day | ORAL | 5 refills | Status: DC

## 2016-07-18 NOTE — Telephone Encounter (Signed)
Pharmacy requesting refills. She also changed pharmacies. Updated patient's pharmacy as below.

## 2016-08-14 DIAGNOSIS — R7302 Impaired glucose tolerance (oral): Secondary | ICD-10-CM | POA: Insufficient documentation

## 2016-08-14 DIAGNOSIS — K7460 Unspecified cirrhosis of liver: Secondary | ICD-10-CM | POA: Diagnosis not present

## 2016-08-14 DIAGNOSIS — K7581 Nonalcoholic steatohepatitis (NASH): Secondary | ICD-10-CM | POA: Diagnosis not present

## 2016-08-14 DIAGNOSIS — E785 Hyperlipidemia, unspecified: Secondary | ICD-10-CM | POA: Diagnosis not present

## 2016-08-14 DIAGNOSIS — Z87898 Personal history of other specified conditions: Secondary | ICD-10-CM | POA: Diagnosis not present

## 2016-08-14 DIAGNOSIS — Z6835 Body mass index (BMI) 35.0-35.9, adult: Secondary | ICD-10-CM | POA: Diagnosis not present

## 2016-08-20 ENCOUNTER — Other Ambulatory Visit: Payer: Self-pay | Admitting: Family Medicine

## 2016-08-20 DIAGNOSIS — F419 Anxiety disorder, unspecified: Secondary | ICD-10-CM

## 2016-08-20 MED ORDER — PAROXETINE HCL 20 MG PO TABS: 20.0000 mg | ORAL_TABLET | Freq: Every day | ORAL | 3 refills | Status: DC

## 2016-08-20 NOTE — Telephone Encounter (Signed)
Pt contacted office for refill request on the following medication:  PARoxetine (PAXIL) 20 MG tablet.  [facility].  CB#[phone]/MW

## 2016-08-20 NOTE — Telephone Encounter (Signed)
LOV 04/05/2016. Renaldo Fiddler, CMA

## 2016-09-07 DIAGNOSIS — K7581 Nonalcoholic steatohepatitis (NASH): Secondary | ICD-10-CM | POA: Diagnosis not present

## 2016-09-07 DIAGNOSIS — K7460 Unspecified cirrhosis of liver: Secondary | ICD-10-CM | POA: Diagnosis not present

## 2016-09-21 DIAGNOSIS — R350 Frequency of micturition: Secondary | ICD-10-CM | POA: Diagnosis not present

## 2016-09-21 DIAGNOSIS — R339 Retention of urine, unspecified: Secondary | ICD-10-CM | POA: Diagnosis not present

## 2016-09-21 DIAGNOSIS — Z6836 Body mass index (BMI) 36.0-36.9, adult: Secondary | ICD-10-CM | POA: Diagnosis not present

## 2016-09-21 DIAGNOSIS — N3941 Urge incontinence: Secondary | ICD-10-CM | POA: Diagnosis not present

## 2016-10-11 ENCOUNTER — Encounter: Payer: Self-pay | Admitting: Family Medicine

## 2016-10-11 ENCOUNTER — Ambulatory Visit (INDEPENDENT_AMBULATORY_CARE_PROVIDER_SITE_OTHER): Payer: Medicare Other | Admitting: Family Medicine

## 2016-10-11 VITALS — BP 132/70 | HR 72 | Temp 98.9°F | Resp 16 | Wt 233.0 lb

## 2016-10-11 DIAGNOSIS — K7460 Unspecified cirrhosis of liver: Secondary | ICD-10-CM | POA: Diagnosis not present

## 2016-10-11 DIAGNOSIS — S0300XA Dislocation of jaw, unspecified side, initial encounter: Secondary | ICD-10-CM | POA: Diagnosis not present

## 2016-10-11 MED ORDER — PREDNISONE 20 MG PO TABS: 20.0000 mg | ORAL_TABLET | Freq: Two times a day (BID) | ORAL | 0 refills | Status: AC

## 2016-10-11 NOTE — Progress Notes (Signed)
Patient: Anita Bennett Female    DOB: 07-12-1941   76 y.o.   MRN: [id number] Visit Date: 10/11/2016  Today's Provider: Lelon Huh, MD   Chief Complaint  Patient presents with  . Ear Pain   Subjective:    Otalgia   There is pain in the left ear. This is a new problem. The current episode started 1 to 4 weeks ago (2 weeks). The problem occurs constantly. The problem has been gradually worsening. There has been no fever. The pain is mild. Associated symptoms include headaches, neck pain and a sore throat. Pertinent negatives include no ear discharge. She has tried NSAIDs for the symptoms. The treatment provided mild relief.  She states that it is worsen when she eats, swallows, and chews, and when she lays her head on the affected side.  She reports that she had a dental procedure 2 weeks ago, and she has had pain ever since. She also mentions that she called the ENT for an appt, and has appointment to see Dr. Tami Ribas April 10. No fevers, no change in hearing. No URI symptoms.     Allergies  Allergen Reactions  . Epinephrine     Heart race  . No Known Allergies   . Promethazine Hcl     hyperactivity  . Statins      Current Outpatient Prescriptions:  .  acetaminophen (TYLENOL) 500 MG tablet, Take 500 mg by mouth every 6 (six) hours as needed., Disp: , Rfl:  .  ALPRAZolam (XANAX) 1 MG tablet, take 1 tablet by mouth daily if needed for anxiety, Disp: 30 tablet, Rfl: 5 .  levothyroxine (SYNTHROID, LEVOTHROID) 100 MCG tablet, Take 1 tablet (100 mcg total) by mouth daily., Disp: 90 tablet, Rfl: 5 .  metoprolol succinate (TOPROL-XL) 25 MG 24 hr tablet, take 1 tablet by mouth once daily, Disp: 90 tablet, Rfl: 3 .  PARoxetine (PAXIL) 20 MG tablet, Take 1 tablet (20 mg total) by mouth daily., Disp: 90 tablet, Rfl: 3 .  rifaximin (XIFAXAN) 550 MG TABS tablet, Take 1 tablet by mouth 2 (two) times daily., Disp: , Rfl:  .  aspirin 81 MG tablet, Take 81 mg by mouth as needed for pain.,  Disp: , Rfl:  .  vitamin B-12 (CYANOCOBALAMIN) 1000 MCG tablet, Take 1 tablet (1,000 mcg total) by mouth daily. (Patient not taking: Reported on 10/11/2016), Disp: 1 tablet, Rfl: 0  Current Facility-Administered Medications:  .  levalbuterol (XOPENEX) nebulizer solution 1.25 mg, 1.25 mg, Nebulization, Once, Margarita Rana, MD  Review of Systems  HENT: Positive for ear pain and sore throat. Negative for ear discharge.   Musculoskeletal: Positive for neck pain.  Neurological: Positive for headaches.    Social History  Substance Use Topics  . Smoking status: Never Smoker  . Smokeless tobacco: Never Used  . Alcohol use No   Objective:   BP 132/70 (BP Location: Left Arm, Patient Position: Sitting, Cuff Size: Large)   Pulse 72   Temp 98.9 F (37.2 C)   Resp 16   Wt 233 lb (105.7 kg)   BMI 37.61 kg/m  Vitals:   10/11/16 1041  BP: 132/70  Pulse: 72  Resp: 16  Temp: 98.9 F (37.2 C)  Weight: 233 lb (105.7 kg)     Physical Exam  General Appearance:    Alert, cooperative, no distress  HENT:   bilateral TM normal without fluid or infection, neck without nodes, throat normal without erythema or exudate, pharynx erythematous  without exudate and sinuses non-tender Tender and slightly swollen around TMJ. Pain reproduced by palpation of TMJ  Eyes:    PERRL, conjunctiva/corneas clear, EOM's intact       Lungs:     Clear to auscultation bilaterally, respirations unlabored  Heart:    Regular rate and rhythm  Neurologic:   Awake, alert, oriented x 3. No apparent focal neurological           defect.           Assessment & Plan:     1. Dislocation of temporomandibular joint, initial encounter She is not to take NSAIDs, prescription or OTC due to liver cirrhosis. She states she does not tolerate muscle relaxers - predniSONE (DELTASONE) 20 MG tablet; Take 1 tablet (20 mg total) by mouth 2 (two) times daily with a meal.  Dispense: 20 tablet; Refill: 0  She is to keep follow up with ENT if  symptoms do not resolve on prednisone.   2. Cirrhosis of liver without ascites, unspecified hepatic cirrhosis type ([place])        Lelon Huh, MD  [facility]

## 2016-10-18 ENCOUNTER — Other Ambulatory Visit: Payer: Self-pay | Admitting: Ophthalmology

## 2016-10-18 DIAGNOSIS — H532 Diplopia: Secondary | ICD-10-CM

## 2016-10-19 ENCOUNTER — Other Ambulatory Visit
Admission: RE | Admit: 2016-10-19 | Discharge: 2016-10-19 | Disposition: A | Discharge status: Home / Self Care | Payer: Medicare Other | Source: Ambulatory Visit | Attending: Ophthalmology | Admitting: Ophthalmology

## 2016-10-19 DIAGNOSIS — H532 Diplopia: Secondary | ICD-10-CM | POA: Insufficient documentation

## 2016-10-19 LAB — PLATELET COUNT: PLATELETS: 188 10*3/uL (ref 150–440)

## 2016-10-19 LAB — SEDIMENTATION RATE: Sed Rate: 20 mm/hr (ref 0–30)

## 2016-10-19 LAB — C-REACTIVE PROTEIN: CRP: <0.8 mg/dL (ref <1.0)

## 2016-10-23 DIAGNOSIS — H9209 Otalgia, unspecified ear: Secondary | ICD-10-CM | POA: Diagnosis not present

## 2016-10-23 DIAGNOSIS — R682 Dry mouth, unspecified: Secondary | ICD-10-CM | POA: Diagnosis not present

## 2016-10-23 DIAGNOSIS — M26609 Unspecified temporomandibular joint disorder, unspecified side: Secondary | ICD-10-CM | POA: Diagnosis not present

## 2016-10-24 ENCOUNTER — Other Ambulatory Visit: Payer: Self-pay | Admitting: Family Medicine

## 2016-10-24 DIAGNOSIS — I10 Essential (primary) hypertension: Secondary | ICD-10-CM

## 2016-10-24 NOTE — Telephone Encounter (Signed)
Refill request for metoprolol 25 mg qd Last filled by MD on- 10/18/2015 #90 x3-filled by Venia Minks Last Appt: 04/05/2016 Next Appt: none Please advise refill?

## 2016-10-26 ENCOUNTER — Other Ambulatory Visit: Payer: Self-pay | Admitting: Family Medicine

## 2016-10-26 DIAGNOSIS — F419 Anxiety disorder, unspecified: Secondary | ICD-10-CM

## 2016-10-26 MED ORDER — ALPRAZOLAM 1 MG PO TABS: ORAL_TABLET | ORAL | 0 refills | Status: DC

## 2016-10-26 NOTE — Telephone Encounter (Signed)
Pt contacted office for refill request on the following medications:  ALPRAZolam (XANAX) 1 MG tablet.  [facility].  CB#[phone]/MW  This is a Dr Caryn Section pt.  Pt is requesting a refill sent today if possible due to going out of town/MW

## 2016-10-26 NOTE — Telephone Encounter (Signed)
Phone in 30 tablets to [facility]. Further refills will be from Dr. Caryn Section.

## 2016-10-31 ENCOUNTER — Ambulatory Visit: Payer: Medicare Other

## 2016-11-10 ENCOUNTER — Ambulatory Visit: Payer: Medicare Other

## 2016-11-24 ENCOUNTER — Encounter: Payer: Self-pay | Admitting: Radiology

## 2016-11-24 ENCOUNTER — Ambulatory Visit
Admission: RE | Admit: 2016-11-24 | Discharge: 2016-11-24 | Disposition: A | Discharge status: Home / Self Care | Payer: Medicare Other | Source: Ambulatory Visit | Attending: Ophthalmology | Admitting: Ophthalmology

## 2016-11-24 DIAGNOSIS — M2669 Other specified disorders of temporomandibular joint: Secondary | ICD-10-CM | POA: Insufficient documentation

## 2016-11-24 DIAGNOSIS — I6782 Cerebral ischemia: Secondary | ICD-10-CM | POA: Insufficient documentation

## 2016-11-24 DIAGNOSIS — H532 Diplopia: Secondary | ICD-10-CM

## 2016-11-24 LAB — POCT I-STAT CREATININE: CREATININE: 1.2 mg/dL — AB (ref 0.44–1.00)

## 2016-11-24 MED ORDER — GADOBENATE DIMEGLUMINE 529 MG/ML IV SOLN
10.0000 mL | Freq: Once | INTRAVENOUS | Status: AC | PRN
  Administered 2016-11-24: 10 mL via INTRAVENOUS

## 2016-11-26 ENCOUNTER — Other Ambulatory Visit: Payer: Self-pay | Admitting: Family Medicine

## 2016-11-26 DIAGNOSIS — F419 Anxiety disorder, unspecified: Secondary | ICD-10-CM

## 2016-11-26 MED ORDER — ALPRAZOLAM 1 MG PO TABS: ORAL_TABLET | ORAL | 3 refills | Status: DC

## 2016-11-26 NOTE — Telephone Encounter (Signed)
Please call in alprazolam.

## 2016-11-26 NOTE — Telephone Encounter (Signed)
Pt needs a new refill on her  ALPRAZolam (XANAX) 1 MG tablet  She uses [facility] now.  She is [place] out of [place] tomorrow and needs the refill asap.  Her call back is [phone]  [place]

## 2016-11-27 NOTE — Telephone Encounter (Signed)
Rx called in to pharmacy. 

## 2016-12-05 DIAGNOSIS — R300 Dysuria: Secondary | ICD-10-CM | POA: Diagnosis not present

## 2016-12-05 DIAGNOSIS — R739 Hyperglycemia, unspecified: Secondary | ICD-10-CM | POA: Diagnosis not present

## 2016-12-05 DIAGNOSIS — M545 Low back pain: Secondary | ICD-10-CM | POA: Diagnosis not present

## 2016-12-05 DIAGNOSIS — K7581 Nonalcoholic steatohepatitis (NASH): Secondary | ICD-10-CM | POA: Diagnosis not present

## 2016-12-05 DIAGNOSIS — I341 Nonrheumatic mitral (valve) prolapse: Secondary | ICD-10-CM | POA: Diagnosis not present

## 2016-12-05 DIAGNOSIS — E785 Hyperlipidemia, unspecified: Secondary | ICD-10-CM | POA: Diagnosis not present

## 2016-12-11 DIAGNOSIS — H532 Diplopia: Secondary | ICD-10-CM | POA: Diagnosis not present

## 2016-12-11 DIAGNOSIS — H2640 Unspecified secondary cataract: Secondary | ICD-10-CM | POA: Diagnosis not present

## 2016-12-11 DIAGNOSIS — H5000 Unspecified esotropia: Secondary | ICD-10-CM | POA: Diagnosis not present

## 2016-12-11 DIAGNOSIS — Z961 Presence of intraocular lens: Secondary | ICD-10-CM | POA: Diagnosis not present

## 2017-02-12 DIAGNOSIS — K7460 Unspecified cirrhosis of liver: Secondary | ICD-10-CM | POA: Diagnosis not present

## 2017-02-12 DIAGNOSIS — R5383 Other fatigue: Secondary | ICD-10-CM | POA: Diagnosis not present

## 2017-02-12 DIAGNOSIS — K7581 Nonalcoholic steatohepatitis (NASH): Secondary | ICD-10-CM | POA: Diagnosis not present

## 2017-02-12 DIAGNOSIS — Z1231 Encounter for screening mammogram for malignant neoplasm of breast: Secondary | ICD-10-CM | POA: Diagnosis not present

## 2017-02-12 DIAGNOSIS — K7290 Hepatic failure, unspecified without coma: Secondary | ICD-10-CM | POA: Diagnosis not present

## 2017-02-12 DIAGNOSIS — E039 Hypothyroidism, unspecified: Secondary | ICD-10-CM | POA: Diagnosis not present

## 2017-02-12 DIAGNOSIS — Z79899 Other long term (current) drug therapy: Secondary | ICD-10-CM | POA: Diagnosis not present

## 2017-02-12 DIAGNOSIS — K5900 Constipation, unspecified: Secondary | ICD-10-CM | POA: Diagnosis not present

## 2017-03-19 DIAGNOSIS — H5000 Unspecified esotropia: Secondary | ICD-10-CM | POA: Diagnosis not present

## 2017-03-21 NOTE — Progress Notes (Signed)
Cardiology Office Note  Date:  03/22/2017   ID:  Anita Bennett, DOB 26-Aug-1940, MRN [id number]  PCP:  Patient, No Pcp Per   Chief Complaint  Patient presents with  . OTHER    C/o chest pain double vision. Meds reviewed verbally with pt.    HPI:  Anita Bennett is a pleasant 76 year old woman with history of  migraines, incontinence, Cirrhosis, NASH followed at [facility] memory issues, stable obesity   who presents for follow-up of her heart murmur  In general she reports that she is doing well Moved in with her daughter over the past year, no complaints Weight down 18 pounds in 5 pounds, not even trying  She does not have a regular exercise program,  Denies any falls  difficulty driving. Previously with migraines with associated aura. Difficulty with incontinence. Bathroom is very close to her bed but still unable to make it to the bathroom, has to wear depends undergarment. Previously drinking significant soda daytime and nighttime. Seen by urology for incontinence  EKG on today's visit shows normal sinus rhythm with rate 64 bpm, no significant ST or T-wave changes  PMH:   has a past medical history of Arrhythmia; H/O cirrhosis; H/O knee surgery; Heart murmur; History of kidney stones; History of toe surgery; Hyperlipidemia; Hypertension; and Thyroid disease.  PSH:    Past Surgical History:  Procedure Laterality Date  . arm surgery    . BACK SURGERY    . CHOLECYSTECTOMY    . TOE SURGERY    . TOTAL ABDOMINAL HYSTERECTOMY    . TOTAL KNEE ARTHROPLASTY Bilateral     Current Outpatient Prescriptions  Medication Sig Dispense Refill  . acetaminophen (TYLENOL) 500 MG tablet Take 500 mg by mouth every 6 (six) hours as needed.    . ALPRAZolam (XANAX) 1 MG tablet take 1 tablet by mouth daily if needed for anxiety 30 tablet 3  . Ascorbic Acid (VITAMIN C ADULT GUMMIES PO) Take by mouth daily.    Marland Kitchen levothyroxine (SYNTHROID, LEVOTHROID) 100 MCG tablet Take 1 tablet (100 mcg total)  by mouth daily. 90 tablet 5  . metoprolol succinate (TOPROL-XL) 25 MG 24 hr tablet TAKE 1 TABLET BY MOUTH ONCE DAILY 90 tablet 0  . PARoxetine (PAXIL) 20 MG tablet Take 1 tablet (20 mg total) by mouth daily. 90 tablet 3  . rifaximin (XIFAXAN) 550 MG TABS tablet Take 550 mg by mouth 2 (two) times daily.    Marland Kitchen tolterodine (DETROL LA) 4 MG 24 hr capsule Take 4 mg by mouth daily.    . vitamin B-12 (CYANOCOBALAMIN) 1000 MCG tablet Take 1 tablet (1,000 mcg total) by mouth daily. 1 tablet 0  . VITAMIN D, ERGOCALCIFEROL, PO Take by mouth daily.     No current facility-administered medications for this visit.      Allergies:   Epinephrine; No known allergies; Promethazine hcl; and Statins   Social History:  The patient  reports that she has never smoked. She has never used smokeless tobacco. She reports that she does not drink alcohol or use drugs.   Family History:   family history includes Stroke in her mother.    Review of Systems: Review of Systems  Constitutional: Negative.   Respiratory: Negative.   Cardiovascular: Negative.   Gastrointestinal: Negative.   Musculoskeletal: Negative.   Neurological: Negative.   Psychiatric/Behavioral: Positive for memory loss.  All other systems reviewed and are negative.    PHYSICAL EXAM: VS:  BP (!) 155/80 (BP Location: Left Arm, Patient  Position: Sitting, Cuff Size: Normal)   Pulse 64   Ht 5' 6"  (1.676 m)   Wt 215 lb 4 oz (97.6 kg)   BMI 34.74 kg/m  , BMI Body mass index is 34.74 kg/m. GEN: Well nourished, well developed, in no acute distress  HEENT: normal  Neck: no JVD, carotid bruits, or masses Cardiac: RRR; no murmurs, rubs, or gallops,no edema  Respiratory:  clear to auscultation bilaterally, normal work of breathing GI: soft, nontender, nondistended, + BS MS: no deformity or atrophy  Skin: warm and dry, no rash Neuro:  Strength and sensation are intact Psych: euthymic mood, full affect    Recent Labs: 10/19/2016: Platelets  188 11/24/2016: Creatinine, Ser 1.20    Lipid Panel No results found for: CHOL, HDL, LDLCALC, TRIG    Wt Readings from Last 3 Encounters:  03/22/17 215 lb 4 oz (97.6 kg)  10/11/16 233 lb (105.7 kg)  04/05/16 228 lb (103.4 kg)       ASSESSMENT AND PLAN:  Shortness of breath - Plan: EKG 12-Lead Denies significant shortness of breath on today's visit, Improving with weight loss, recommended regular exercise program  Aortic heart murmur on examination - Plan: EKG 12-Lead Stable murmur on exam, no indication for further testing at this time Likely aortic valve sclerosis without significant stenosis  Cirrhosis of liver without ascites, unspecified hepatic cirrhosis type ([place]) - Plan: EKG 12-Lead Followed at [facility] hospital  NASH (nonalcoholic steatohepatitis) - Plan: EKG 12-Lead Reports 15 pounds or more weight loss over the past 5 months  Benign hypertension - Plan: EKG 12-Lead Blood pressure is well controlled on today's visit. No changes made to the medications.   Total encounter time more than 25 minutes  Greater than 50% was spent in counseling and coordination of care with the patient   Disposition:   F/U  12 months as needed   Orders Placed This Encounter  Procedures  . EKG 12-Lead     Signed, Esmond Plants, M.D., Ph.D. 03/22/2017  [place], [place]

## 2017-03-22 ENCOUNTER — Encounter: Payer: Self-pay | Admitting: Cardiovascular Disease

## 2017-03-22 ENCOUNTER — Ambulatory Visit (INDEPENDENT_AMBULATORY_CARE_PROVIDER_SITE_OTHER): Payer: Medicare Other | Admitting: Cardiovascular Disease

## 2017-03-22 VITALS — BP 140/70 | HR 64 | Ht 66.0 in | Wt 215.2 lb

## 2017-03-22 DIAGNOSIS — K7581 Nonalcoholic steatohepatitis (NASH): Secondary | ICD-10-CM | POA: Diagnosis not present

## 2017-03-22 DIAGNOSIS — I358 Other nonrheumatic aortic valve disorders: Secondary | ICD-10-CM

## 2017-03-22 DIAGNOSIS — E782 Mixed hyperlipidemia: Secondary | ICD-10-CM

## 2017-03-22 DIAGNOSIS — K7460 Unspecified cirrhosis of liver: Secondary | ICD-10-CM | POA: Diagnosis not present

## 2017-03-22 DIAGNOSIS — R0602 Shortness of breath: Secondary | ICD-10-CM

## 2017-03-22 DIAGNOSIS — I10 Essential (primary) hypertension: Secondary | ICD-10-CM

## 2017-03-22 NOTE — Patient Instructions (Addendum)
Phone number for primary care: Dr. Parks Ranger:  [phone] Dr. Caryl Bis: [phone]  You are doing well today  Medication Instructions:   No medication changes made  Labwork:  No new labs needed  Testing/Procedures:  No further testing at this time   Follow-Up: It was a pleasure seeing you in the office today. Please call us if you have new issues that need to be addressed before your next appt.  [phone]  Your physician wants you to follow-up in: 12 months as needed.  You will receive a reminder letter in the mail two months in advance. If you don't receive a letter, please call our office to schedule the follow-up appointment.  If you need a refill on your cardiac medications before your next appointment, please call your pharmacy.

## 2017-04-19 DIAGNOSIS — K7581 Nonalcoholic steatohepatitis (NASH): Secondary | ICD-10-CM | POA: Diagnosis not present

## 2017-04-19 DIAGNOSIS — K766 Portal hypertension: Secondary | ICD-10-CM | POA: Diagnosis not present

## 2017-04-19 DIAGNOSIS — Z1231 Encounter for screening mammogram for malignant neoplasm of breast: Secondary | ICD-10-CM | POA: Diagnosis not present

## 2017-04-19 DIAGNOSIS — N6452 Nipple discharge: Secondary | ICD-10-CM | POA: Diagnosis not present

## 2017-04-19 DIAGNOSIS — K7460 Unspecified cirrhosis of liver: Secondary | ICD-10-CM | POA: Diagnosis not present

## 2017-04-25 DIAGNOSIS — Z6836 Body mass index (BMI) 36.0-36.9, adult: Secondary | ICD-10-CM | POA: Diagnosis not present

## 2017-04-25 DIAGNOSIS — R350 Frequency of micturition: Secondary | ICD-10-CM | POA: Diagnosis not present

## 2017-04-25 DIAGNOSIS — R339 Retention of urine, unspecified: Secondary | ICD-10-CM | POA: Diagnosis not present

## 2017-04-25 DIAGNOSIS — N3941 Urge incontinence: Secondary | ICD-10-CM | POA: Diagnosis not present

## 2017-04-26 DIAGNOSIS — Z23 Encounter for immunization: Secondary | ICD-10-CM | POA: Diagnosis not present

## 2017-05-22 ENCOUNTER — Ambulatory Visit (INDEPENDENT_AMBULATORY_CARE_PROVIDER_SITE_OTHER): Payer: Medicare Other | Admitting: Family Medicine

## 2017-05-22 ENCOUNTER — Encounter: Payer: Self-pay | Admitting: Family Medicine

## 2017-05-22 VITALS — BP 158/74 | HR 62 | Temp 98.2°F | Resp 16 | Ht 66.0 in | Wt 217.6 lb

## 2017-05-22 DIAGNOSIS — K7460 Unspecified cirrhosis of liver: Secondary | ICD-10-CM | POA: Diagnosis not present

## 2017-05-22 DIAGNOSIS — E669 Obesity, unspecified: Secondary | ICD-10-CM | POA: Diagnosis not present

## 2017-05-22 DIAGNOSIS — K7581 Nonalcoholic steatohepatitis (NASH): Secondary | ICD-10-CM | POA: Diagnosis not present

## 2017-05-22 DIAGNOSIS — I10 Essential (primary) hypertension: Secondary | ICD-10-CM

## 2017-05-22 DIAGNOSIS — E038 Other specified hypothyroidism: Secondary | ICD-10-CM | POA: Diagnosis not present

## 2017-05-22 DIAGNOSIS — N3941 Urge incontinence: Secondary | ICD-10-CM

## 2017-05-22 DIAGNOSIS — F419 Anxiety disorder, unspecified: Secondary | ICD-10-CM | POA: Diagnosis not present

## 2017-05-22 DIAGNOSIS — M25511 Pain in right shoulder: Secondary | ICD-10-CM

## 2017-05-22 DIAGNOSIS — M150 Primary generalized (osteo)arthritis: Secondary | ICD-10-CM

## 2017-05-22 DIAGNOSIS — M159 Polyosteoarthritis, unspecified: Secondary | ICD-10-CM

## 2017-05-22 DIAGNOSIS — G8929 Other chronic pain: Secondary | ICD-10-CM | POA: Diagnosis not present

## 2017-05-22 DIAGNOSIS — E039 Hypothyroidism, unspecified: Secondary | ICD-10-CM | POA: Diagnosis not present

## 2017-05-22 MED ORDER — LEVOTHYROXINE SODIUM 100 MCG PO TABS: 100.0000 ug | ORAL_TABLET | Freq: Every day | ORAL | 3 refills | Status: DC

## 2017-05-22 MED ORDER — DICLOFENAC SODIUM 1 % TD GEL: 2.0000 g | Freq: Three times a day (TID) | TRANSDERMAL | 5 refills | Status: DC | PRN

## 2017-05-22 MED ORDER — PAROXETINE HCL 20 MG PO TABS: 20.0000 mg | ORAL_TABLET | Freq: Every day | ORAL | 3 refills | Status: DC

## 2017-05-22 MED ORDER — METOPROLOL SUCCINATE ER 25 MG PO TB24: 25.0000 mg | ORAL_TABLET | Freq: Every day | ORAL | 3 refills | Status: DC

## 2017-05-22 NOTE — Assessment & Plan Note (Signed)
Prior TSH stable in 01/2017 Continues on levothyroxine 151mg daily Refill

## 2017-05-22 NOTE — Assessment & Plan Note (Signed)
Chronic problem with liver cirrhosis secondary to NASH, confirmed on biopsy 2011 Followed by [place] NP Last visit 01/2017, continues on Xifaxin and Lactulose With some confusion more consistent with dementia with memory loss Follow-up as planned with [facility]

## 2017-05-22 NOTE — Assessment & Plan Note (Signed)
Chronic R shoulder pain and limited movement with known rotator cuff tear by MRI 2015 per [facility] S/p PT for up to 1 year, now limited therapy - No recent imaging or ortho eval - Limiting her function some but still able to do most daily functions, does wake up if painful and lays on - Contraindicated NSAIDs and limited Tylenol due to cirrhosis  Plan: 1. May try topical Diclofenac gel as needed 2. Relative rest but keep shoulder mobile, ROM exercises, avoid heavy lifting 3. Topical muscle rub, May try heating pad PRN 4. Will need to review this further with full shoulder exam at follow-up consider subacromial steroid inj, X-rays eval for arthritis. If worsening night-time symptoms or weakness, vs return to [facility] who was following her in past and recommended injection therapy

## 2017-05-22 NOTE — Patient Instructions (Addendum)
Thank you for coming to the clinic today.  1. Refilled most medications  New med - topical Diclofenac (Voltaren) gel for hand arthritis - may use small amount up to 3 times daily as needed for pain - Needs to get approved first, stay tuned  2. Keep up the good work.  Please schedule a Follow-up Appointment to: Return in about 3 months (around 08/22/2017) for HTN, Arthritis, R shoulder pain, Anxiety.  If you have any other questions or concerns, please feel free to call the clinic or send a message through [facility]. You may also schedule an earlier appointment if necessary.  Additionally, you may be receiving a survey about your experience at our clinic within a few days to 1 week by e-mail or mail. We value your feedback.  Nobie Putnam, DO [place]

## 2017-05-22 NOTE — Assessment & Plan Note (Signed)
Followed by [facility] Dr Jacqlyn Larsen On Detrol LA for improve control urinary incontinence and urge symptoms

## 2017-05-22 NOTE — Assessment & Plan Note (Signed)
Weight loss with improved lifestyle, exercise, now swimming BMI >35 With comorbidity HTN, HLD Encouraged to continue improve regular lifestyle changes for now, monitor labs

## 2017-05-22 NOTE — Assessment & Plan Note (Addendum)
Stable chronic anxiety, seems mostly well controlled Triggers with family stressors with daughters She has Xanax, only taking rarely PRN for migraines not regularly for anxiety No new concerns today. Not due for refill Xanax Continues on Paxil 59m daily for years, she was told years ago that this would cause problem discontinuing and hard to get patients off this med, I am a little unsure if this was actually in reference to the Xanax instead. Reviewed with her that we could certainly try taper off Paxil in future if she feels anxiety is under control. Will review at next visit

## 2017-05-22 NOTE — Progress Notes (Signed)
Subjective:    Patient ID: Anita Bennett, female    DOB: [birth year], 76 y.o.   MRN: [id number]  Anita Bennett is a 76 y.o. female presenting on 05/22/2017 for Establish Care (as pr patient has liver problem and was out of her meds from last 4 days which increase her amonia level and inturn effects her memory but now back on her meds. )  Previous PCP reportedly at [facility] or other location, she is unsure. Previously saw Anita Bennett.  HPI  Specialists: Hepatology/Liver - Anita Moras NP ([facility]) Urology - Anita Bennett [facility]) Ophthalmology - Anita Bennett [facility]) Cardiology - Anita Bennett [facility]) - Pre-op clearance, no regular follow-up  Cirrhosis secondary to NASH, with history of hepatic encephalopathy - Followed by [facility], Anita Moras NP. Last seen 02/12/17. She has history of confirmed NASH cirrhosis on liver biopsy since 2011. Has followed chronically with [facility]. She is taking Xifaxan covered by [facility] after approval - History of chronic elevated bilirubin in past. No alcohol consumption history - Reports problem with confusion episode at times, worse with memory deficit, she tries to avoid traveling or driving too far to avoid problems - Symptoms improved on Xifaxin and Lactuose  CHRONIC HTN: Reports history of elevated BP, often worse if at doctors office. Admits white coat syndrome. Daughter's nurse has showed her how to check BP, can check at home occasional. Current Meds - Metoprolol XL 8m daily - due for refill   Reports good compliance, took meds today. Tolerating well, w/o complaints. Lifestyle: - Diet: tries to improve - Exercise: difficulty walking due to hips and arthritis, [facility], dyspnea, HA, edema, dizziness / lightheadedness  Osteoarthritis Multiple joints / History of R shoulder injury vs possible rotator cuff tear - Reports chronic OA/DJD multiple joints especially knees (s/p  knee surgery), both hands finger/thumb joints, and shoulders with R shoulder worse with old injury, [facility], showed fluid within subcoracoid bursa concern for full thickeness rotator cuff tear, also tendinosis, and some tear of biceps tendon, she had seen [facility] in 2016 and 2017, for same problem, encouraged continued PT for rehab, she did this for up to 1 year some mixed results, and they recommended to return and consider [place] bicep tendon injection if indicated. She has not had any injections - She cannot take NSAIDs due to cirrhosis. Rarely takes Tylenol  Hypothyroidism - Reports chronic history of hypothyroidism, last lab TSH normal in 01/2017 - On Levothyroxine 1020m daily, has meds currently  History of Anxiety: - Chronic history anxiety, mostly related to family stressors. 2 daughters, [place] married to doctor, has problems in past with alcohol use, causes some stress for [name] SiadOther is in [place] passed away at age 1911n JaFebruary 01, 2024problem with drug overdose following traumatic accident and leg amputation)  - She has xanax but not taking for anxiety, only for migraine PRN, see below  Mixed urinary incontinence, urge - [facility] - Continues on Detrol-LA for urinary incontinence symptoms, also has depends to avoid leakage  History of Chronic MIgraine Headaches with aura - She reports chronic history since age [age]ith migraines, she has aura and visual deficit and constellation of symptoms, she will take Tylenol and Xanax 79m88musually take half tab ONLY with migraine), has large amount at home not taking regularly, usually last 1  year, has infrequent episodes only 1 x month or less, she has taken Tylenol only sometimes with relief instead of Xanax. She does NOT endorse pain with headache migraine it is mostly aura and visual disturbance - Strong family history of migraine headaches mother, daughter, and other  children.  Health Maintenance: - UTD Flu Shot 05/08/17  Depression screen PHQ 2/9 05/22/2017 04/05/2016  Decreased Interest 0 0  Down, Depressed, Hopeless 0 0  PHQ - 2 Score 0 0    Past Medical History:  Diagnosis Date  . Arrhythmia   . H/O knee surgery    2011 [place] knee (replacement)  . Heart murmur   . History of kidney stones   . History of toe surgery    12/01/14 [facility]  . Hyperlipidemia   . Hypertension   . Thyroid disease    Past Surgical History:  Procedure Laterality Date  . arm surgery    . BACK SURGERY    . CHOLECYSTECTOMY    . TOE SURGERY    . TOTAL ABDOMINAL HYSTERECTOMY    . TOTAL KNEE ARTHROPLASTY Bilateral    Social History   Social History  . Marital status: Widowed    Spouse name: N/A  . Number of children: N/A  . Years of education: Graduate   Occupational History  . 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 Smoking status: Never Smoker  . Smokeless tobacco: Never Used  . Alcohol use No  . Drug use: No  . Sexual activity: Not on file   Other Topics Concern  . Not on file   Social History Narrative  . No narrative on file   Family History  Problem Relation Age of Onset  . Stroke Mother    Current Outpatient Prescriptions on File Prior to Visit  Medication Sig  . acetaminophen (TYLENOL) 500 MG tablet Take 500 mg by mouth every 6 (six) hours as needed.  . ALPRAZolam (XANAX) 1 MG tablet take 1 tablet by mouth daily if needed for anxiety  . Ascorbic Acid (VITAMIN C ADULT GUMMIES PO) Take by mouth daily.  . rifaximin (XIFAXAN) 550 MG TABS tablet Take 550 mg by mouth 2 (two) times daily.  Marland Kitchen tolterodine (DETROL LA) 4 MG 24 hr capsule Take 4 mg by mouth daily.  . vitamin B-12 (CYANOCOBALAMIN) 1000 MCG tablet Take 1 tablet (1,000 mcg total) by mouth daily.  Marland Kitchen VITAMIN D, ERGOCALCIFEROL, PO Take by mouth daily.   No current  facility-administered medications on file prior to visit.     Review of Systems Per HPI unless specifically indicated above     Objective:    BP (!) 158/74 (BP Location: Left Arm, Cuff Size: Normal)   Pulse 62   Temp 98.2 F (36.8 C) (Oral)   Resp 16   Ht 5' 6"  (1.676 m)   Wt 217 lb 9.6 oz (98.7 kg)   BMI 35.12 kg/m   Wt Readings from Last 3 Encounters:  05/22/17 217 lb 9.6 oz (98.7 kg)  03/22/17 215 lb 4 oz (97.6 kg)  10/11/16 233 lb (105.7 kg)    Physical Exam  Constitutional: She is oriented to person, place, and time. She appears well-developed and well-nourished. No distress.  Well-appearing 76 year old female, comfortable, cooperative  HENT:  Head: Normocephalic and atraumatic.  Mouth/Throat: Oropharynx is clear and moist.  Eyes: Conjunctivae are normal. Right eye  exhibits no discharge. Left eye exhibits no discharge.  Neck: Normal range of motion. Neck supple. No thyromegaly present.  Cardiovascular: Normal rate, regular rhythm and intact distal pulses.   Murmur (2/6 systolic aortic murmur, unchanged chronic) heard. Pulmonary/Chest: Effort normal and breath sounds normal. No respiratory distress. She has no wheezes. She has no rales.  Musculoskeletal: She exhibits no edema.  Right shoulder - not fully examined some slightly reduced range of motion flex/ext above shoulder  Bilateral hands with DIP, PIP and CMC joints with slight bulky appearance, without edema or erythema  Lymphadenopathy:    She has no cervical adenopathy.  Neurological: She is alert and oriented to person, place, and time.  Skin: Skin is warm and dry. No rash noted. She is not diaphoretic. No erythema.  Psychiatric: She has a normal mood and affect. Her behavior is normal.  Well groomed, good eye contact, normal speech and thoughts. Some difficulty with memory occasional forgetful with names and recalling details, but long-term recall is intact.  Nursing note and vitals reviewed.  Results for  orders placed or performed during the hospital encounter of 11/24/16  I-STAT creatinine  Result Value Ref Range   Creatinine, Ser 1.20 (H) 0.44 - 1.00 mg/dL      Assessment & Plan:   Problem List Items Addressed This Visit    Adult hypothyroidism    Prior TSH stable in 01/2017 Continues on levothyroxine 175mg daily Refill      Relevant Medications   metoprolol succinate (TOPROL-XL) 25 MG 24 hr tablet   levothyroxine (SYNTHROID, LEVOTHROID) 100 MCG tablet   Anxiety    Stable chronic anxiety, seems mostly well controlled Triggers with family stressors with daughters She has Xanax, only taking rarely PRN for migraines not regularly for anxiety No new concerns today. Not due for refill Xanax Continues on Paxil 251mdaily for years, she was told years ago that this would cause problem discontinuing and hard to get patients off this med, I am a little unsure if this was actually in reference to the Xanax instead. Reviewed with her that we could certainly try taper off Paxil in future if she feels anxiety is under control. Will review at next visit      Relevant Medications   PARoxetine (PAXIL) 20 MG tablet   Chronic right shoulder pain    Chronic R shoulder pain and limited movement with known rotator cuff tear by [facility] PT for up to 1 year, now limited therapy - No recent imaging or ortho eval - Limiting her function some but still able to do most daily functions, does wake up if painful and lays on - Contraindicated NSAIDs and limited Tylenol due to cirrhosis  Plan: 1. May try topical Diclofenac gel as needed 2. Relative rest but keep shoulder mobile, ROM exercises, avoid heavy lifting 3. Topical muscle rub, May try heating pad PRN 4. Will need to review this further with full shoulder exam at follow-up consider subacromial steroid inj, X-rays eval for arthritis. If worsening night-time symptoms or weakness, vs [facility] past  and recommended injection therapy      Relevant Medications   PARoxetine (PAXIL) 20 MG tablet   Essential hypertension - Primary    Elevated BP, improve on re-check still elevated. Concern white coat syndrome, inadequately controlled HTN - Home BP readings improved  Complication with cirrhosis   Plan:  1. Continue current BP regimen - Metoprolol XL 2559maily -  discussed may benefit from additional anti-HTN agents in future, agree to not change today 2. Encourage improved lifestyle - low sodium diet, regular exercise as tolerated now with swimming 3. Start monitor BP outside office, bring readings to next visit, if persistently >140/90 or new symptoms notify office sooner 4. Follow-up 3 months BP re-check, med adjust      Relevant Medications   metoprolol succinate (TOPROL-XL) 25 MG 24 hr tablet   Liver cirrhosis secondary to NASH (nonalcoholic steatohepatitis) ([place])    Chronic problem with liver cirrhosis secondary to NASH, confirmed on biopsy 2011 Followed by [place] NP Last visit 01/2017, continues on Xifaxin and Lactulose With some confusion more consistent with dementia with memory loss Follow-up as planned with [facility]      Obesity (BMI 35.0-39.9 without comorbidity)    Weight loss with improved lifestyle, exercise, now swimming BMI >35 With comorbidity HTN, HLD Encouraged to continue improve regular lifestyle changes for now, monitor labs      Osteoarthritis, multiple sites    Stable chronic problem with multiple joints including fingers/hands, shoulders, knees with OA/DJD Prior x-rays confirm, and also had prior MRI imaging including shoulder Previously followed by [facility] Cannot take NSAIDs or higher dose tylenol due to liver  Plan: 1. Rx trial Diclofenac voltaren topical gel apply TID PRN - will likely require prior authorization 2. Follow-up as needed - will revisit this topic further at future visit, may need to return to ortho consider R  shoulder injection in biceps tendon sheath, otherwise I can offer subacromial injection if need      Relevant Medications   diclofenac sodium (VOLTAREN) 1 % GEL   Urinary incontinence, urge    Followed by [facility] Anita Jacqlyn Bennett On Detrol LA for improve control urinary incontinence and urge symptoms       Other Visit Diagnoses    Other specified hypothyroidism       Relevant Medications   metoprolol succinate (TOPROL-XL) 25 MG 24 hr tablet   levothyroxine (SYNTHROID, LEVOTHROID) 100 MCG tablet   Urinary incontinence, unspecified type          Meds ordered this encounter  Medications  . ferrous sulfate 325 (65 FE) MG tablet    Sig: Take by mouth.  . docusate sodium (COLACE) 50 MG capsule    Sig: Take by mouth.  . lactulose (CEPHULAC) 10 g packet    Sig: Take 10 g by mouth 3 (three) times daily.  . metoprolol succinate (TOPROL-XL) 25 MG 24 hr tablet    Sig: Take 1 tablet (25 mg total) by mouth daily.    Dispense:  90 tablet    Refill:  3  . PARoxetine (PAXIL) 20 MG tablet    Sig: Take 1 tablet (20 mg total) by mouth daily.    Dispense:  90 tablet    Refill:  3    Keep refills on file, new PCP now  . levothyroxine (SYNTHROID, LEVOTHROID) 100 MCG tablet    Sig: Take 1 tablet (100 mcg total) by mouth daily.    Dispense:  90 tablet    Refill:  3    Keep refills on file, new PCP now  . diclofenac sodium (VOLTAREN) 1 % GEL    Sig: Apply 2 g topically 3 (three) times daily as needed. For hands, osteoarthritis    Dispense:  100 g    Refill:  5    Follow up plan: Return in about 3 months (around 08/22/2017) for  HTN, Arthritis, R shoulder pain, Anxiety.  Will forward chart to patient's [facility], Anita Moras, NP by request  Nobie Putnam, [facility] 05/22/2017, 3:15 PM

## 2017-05-22 NOTE — Assessment & Plan Note (Signed)
Elevated BP, improve on re-check still elevated. Concern white coat syndrome, inadequately controlled HTN - Home BP readings improved  Complication with cirrhosis   Plan:  1. Continue current BP regimen - Metoprolol XL 85m daily - discussed may benefit from additional anti-HTN agents in future, agree to not change today 2. Encourage improved lifestyle - low sodium diet, regular exercise as tolerated now with swimming 3. Start monitor BP outside office, bring readings to next visit, if persistently >140/90 or new symptoms notify office sooner 4. Follow-up 3 months BP re-check, med adjust

## 2017-05-22 NOTE — Assessment & Plan Note (Signed)
Stable chronic problem with multiple joints including fingers/hands, shoulders, knees with OA/DJD Prior x-rays confirm, and also had prior MRI imaging including shoulder Previously followed by [facility] Cannot take NSAIDs or higher dose tylenol due to liver  Plan: 1. Rx trial Diclofenac voltaren topical gel apply TID PRN - will likely require prior authorization 2. Follow-up as needed - will revisit this topic further at future visit, may need to return to ortho consider R shoulder injection in biceps tendon sheath, otherwise I can offer subacromial injection if need

## 2017-05-29 DIAGNOSIS — K7581 Nonalcoholic steatohepatitis (NASH): Secondary | ICD-10-CM | POA: Diagnosis not present

## 2017-05-29 DIAGNOSIS — E785 Hyperlipidemia, unspecified: Secondary | ICD-10-CM | POA: Diagnosis not present

## 2017-05-29 DIAGNOSIS — K7460 Unspecified cirrhosis of liver: Secondary | ICD-10-CM | POA: Diagnosis not present

## 2017-05-29 DIAGNOSIS — Z6835 Body mass index (BMI) 35.0-35.9, adult: Secondary | ICD-10-CM | POA: Diagnosis not present

## 2017-05-29 DIAGNOSIS — E039 Hypothyroidism, unspecified: Secondary | ICD-10-CM | POA: Diagnosis not present

## 2017-05-29 DIAGNOSIS — Z862 Personal history of diseases of the blood and blood-forming organs and certain disorders involving the immune mechanism: Secondary | ICD-10-CM | POA: Diagnosis not present

## 2017-06-18 ENCOUNTER — Encounter: Payer: Self-pay | Admitting: Family Medicine

## 2017-06-18 ENCOUNTER — Ambulatory Visit (INDEPENDENT_AMBULATORY_CARE_PROVIDER_SITE_OTHER): Payer: Medicare Other | Admitting: Family Medicine

## 2017-06-18 VITALS — BP 147/69 | HR 66 | Temp 98.5°F | Resp 16 | Ht 66.0 in | Wt 217.0 lb

## 2017-06-18 DIAGNOSIS — M545 Low back pain, unspecified: Secondary | ICD-10-CM

## 2017-06-18 DIAGNOSIS — F419 Anxiety disorder, unspecified: Secondary | ICD-10-CM

## 2017-06-18 DIAGNOSIS — M150 Primary generalized (osteo)arthritis: Secondary | ICD-10-CM | POA: Diagnosis not present

## 2017-06-18 DIAGNOSIS — R5381 Other malaise: Secondary | ICD-10-CM | POA: Diagnosis not present

## 2017-06-18 DIAGNOSIS — N3000 Acute cystitis without hematuria: Secondary | ICD-10-CM

## 2017-06-18 DIAGNOSIS — R5383 Other fatigue: Secondary | ICD-10-CM

## 2017-06-18 DIAGNOSIS — N3941 Urge incontinence: Secondary | ICD-10-CM

## 2017-06-18 DIAGNOSIS — R7989 Other specified abnormal findings of blood chemistry: Secondary | ICD-10-CM

## 2017-06-18 DIAGNOSIS — M159 Polyosteoarthritis, unspecified: Secondary | ICD-10-CM

## 2017-06-18 LAB — CBC WITH DIFFERENTIAL/PLATELET
BASOS PCT: 1.7 %
Basophils Absolute: 100 cells/uL (ref 0–200)
EOS ABS: 177 {cells}/uL (ref 15–500)
Eosinophils Relative: 3 %
HCT: 39.4 % (ref 35.0–45.0)
Hemoglobin: 13.1 g/dL (ref 11.7–15.5)
Lymphs Abs: 2189 cells/uL (ref 850–3900)
MCH: 29.6 pg (ref 27.0–33.0)
MCHC: 33.2 g/dL (ref 32.0–36.0)
MCV: 89.1 fL (ref 80.0–100.0)
MONOS PCT: 8.6 %
MPV: 11.4 fL (ref 7.5–12.5)
Neutro Abs: 2926 cells/uL (ref 1500–7800)
Neutrophils Relative %: 49.6 %
PLATELETS: 184 10*3/uL (ref 140–400)
RBC: 4.42 10*6/uL (ref 3.80–5.10)
RDW: 13 % (ref 11.0–15.0)
TOTAL LYMPHOCYTE: 37.1 %
WBC mixed population: 507 cells/uL (ref 200–950)
WBC: 5.9 10*3/uL (ref 3.8–10.8)

## 2017-06-18 LAB — POCT URINALYSIS DIPSTICK
Bilirubin, UA: NEGATIVE
Glucose, UA: NEGATIVE
KETONES UA: NEGATIVE
Nitrite, UA: NEGATIVE
PH UA: 5 (ref 5.0–8.0)
Protein, UA: NEGATIVE
RBC UA: NEGATIVE
Spec Grav, UA: 1.01 (ref 1.010–1.025)
Urobilinogen, UA: 0.2 E.U./dL

## 2017-06-18 LAB — COMPLETE METABOLIC PANEL WITH GFR
AG RATIO: 1.4 (calc) (ref 1.0–2.5)
ALT: 28 U/L (ref 6–29)
AST: 53 U/L — AB (ref 10–35)
Albumin: 4.2 g/dL (ref 3.6–5.1)
Alkaline phosphatase (APISO): 54 U/L (ref 33–130)
BUN/Creatinine Ratio: 7 (calc) (ref 6–22)
BUN: 8 mg/dL (ref 7–25)
CO2: 27 mmol/L (ref 20–32)
Calcium: 9.4 mg/dL (ref 8.6–10.4)
Chloride: 105 mmol/L (ref 98–110)
Creat: 1.12 mg/dL — ABNORMAL HIGH (ref 0.60–0.93)
GFR, Est African American: 54 mL/min/{1.73_m2} — ABNORMAL LOW (ref >=60)
GFR, Est Non African American: 47 mL/min/{1.73_m2} — ABNORMAL LOW (ref >=60)
GLOBULIN: 2.9 g/dL (ref 1.9–3.7)
Glucose, Bld: 74 mg/dL (ref 65–139)
POTASSIUM: 4.8 mmol/L (ref 3.5–5.3)
SODIUM: 140 mmol/L (ref 135–146)
Total Bilirubin: 1.4 mg/dL — ABNORMAL HIGH (ref 0.2–1.2)
Total Protein: 7.1 g/dL (ref 6.1–8.1)

## 2017-06-18 MED ORDER — BACLOFEN 10 MG PO TABS: 5.0000 mg | ORAL_TABLET | Freq: Three times a day (TID) | ORAL | 0 refills | Status: DC | PRN

## 2017-06-18 MED ORDER — ALPRAZOLAM 1 MG PO TABS: ORAL_TABLET | ORAL | 1 refills | Status: DC

## 2017-06-18 NOTE — Patient Instructions (Addendum)
Thank you for coming to the clinic today.  1. I do not think you have the Flu. Your temperature is normal, I know you said it is slightly elevated for you at 98, but I would off on flu testing for now.  We will check blood to re-check your kidney function.  You may be dehydrated if limited water intake. We will notify you of lab result with Creatinine. Important to drink more water for rehydration for now.  Continue Tylenol for back.  Try to rest and avoid worsening the back problem.  Start taking Baclofen (Lioresal) 65m (muscle relaxant) - start with half (cut) to one whole pill at night as needed for next 1-3 nights (may make you drowsy, caution with driving) see how it affects you, then if tolerated increase to one pill 2 to 3 times a day or (every 8 hours as needed)  Also we attempted to check urine today for evaluating for possible Urinary Tract Infection - if we cannot get urine sample, then I would recommend notifying uKoreaif you develop worsening symptoms of UTI with burning with urination, urinary frequency more urination or other concern, we can try treating with antibiotics. Call u[place] to request if you are not improved within 48-72 hours.  It is also possible to have a kidney stone, we attempted to check urine for this as well. If you develop blood in urine, or worsening flank or back or abdominal pain, it may be important to have this checked out more promptly at Emergency Department as they can check the imaging test to see if there is a Kidney Stone.  Please schedule a Follow-up Appointment to: Return in about 2 weeks (around 07/02/2017), or if symptoms worsen or fail to improve, for viral syndrome vs back pain / ?UTI if not improved.  If you have any other questions or concerns, please feel free to call the clinic or send a message through [place] You may also schedule an earlier appointment if necessary.  Additionally, you may be receiving a survey about your experience at  our clinic within a few days to 1 week by e-mail or mail. We value your feedback.  ANobie Putnam DO [place]

## 2017-06-18 NOTE — Progress Notes (Signed)
Subjective:    Patient ID: Anita Bennett, female    DOB: 02/12/1941, 76 y.o.   MRN: [id number]  Anita Bennett is a 76 y.o. female presenting on 06/18/2017 for Chills (onset yesterday back hurts felt sick on stomach felt like fever but temp 98.6 has migraine today felt little better today had some chills yesterday)  Patient presents for a same day appointment.  HPI   ACUTE on Chronic Back Pain / R Shoulder Pain / Chills without documented fever - Reports constellation of symptoms, initially made apt she was concerned for possible flu, without known exposure, but then seems to be feeling better, never had fever >98, states her temp normally 97 now at 98 thought concern. She endorsed symptoms started last night had low back pain R>L she has known chronic LBP, see prior note for details on osteoarthritis multiple sites. She cannot take NSAIDs due to cirrhosis but takes occasional Tylenol PRN, not on muscle relaxant. She states that pain is worse with movement or turning, her back is not aching if resting or sitting still. Now improved some today, has some pain radiating to R abdomen / flank. - Sick contacts with grandchildren over holiday URI colds - Today feels improved overall. Using heat and ice with relief. Tylenol PRN - Additional history has urinary incontinence, with some leakage, has not had UTI in while, does not endorse burning or other symptoms, but hard for her to tell. No recent antibiotics. Admits poor hydration limited water intake - History of migraines, had one recently on Thursday of last week, had aura and lasted for a while. - History of nephrolithiasis many years ago, does not feel similar - Denies actual fever >100F, dysuria, hematuria, urinary frequency, nausea vomiting, sweating, shaking chills, numbness tingling weakness in lower extremities  ELEVATED CREATININE / Possible AKI - Reviewed prior chart with Lab results Cr ranging 1 to 1.2 in past, most recent elevated Cr  1.4 at [place] (05/29/17) about 2 weeks ago, wanted to ask her PCP about this and have it re-checked. - She admits poor hydration limited water intake  History of OA/DJD multiple joints / Back Pain - requesting DMV form today for handicap placard renewal, has existing one already due to orthopedic condition. Has limited ambulation sometimes uses cane.  Health Maintenance: UTD Flu Vaccine 05/08/17 UTD PNA vaccines x 2  Depression screen [facility] 2/9 05/22/2017 04/05/2016  Decreased Interest 0 0  Down, Depressed, Hopeless 0 0  PHQ - 2 Score 0 0    Social History   Tobacco Use  . Smoking status: Never Smoker  . Smokeless tobacco: Never Used  Substance Use Topics  . Alcohol use: No  . Drug use: No    Review of Systems Per HPI unless specifically indicated above     Objective:    BP (!) 147/69   Pulse 66   Temp 98.5 F (36.9 C)   Resp 16   Ht 5' 6"  (1.676 m)   Wt 217 lb (98.4 kg)   SpO2 99%   BMI 35.02 kg/m   Wt Readings from Last 3 Encounters:  06/18/17 217 lb (98.4 kg)  05/22/17 217 lb 9.6 oz (98.7 kg)  03/22/17 215 lb 4 oz (97.6 kg)    Physical Exam  Constitutional: She is oriented to person, place, and time. She appears well-developed and well-nourished. No distress.  Mostly well-appearing, comfortable except some back discomfort with movement, cooperative  HENT:  Head: Normocephalic and atraumatic.  Mouth/Throat: Oropharynx is  clear and moist.  Frontal / maxillary sinuses non-tender. Nares patent without purulence or edema. Oropharynx clear without erythema, exudates, edema or asymmetry.  Eyes: Conjunctivae are normal. Right eye exhibits no discharge. Left eye exhibits no discharge.  Cardiovascular: Normal rate, regular rhythm, normal heart sounds and intact distal pulses.  No murmur heard. Pulmonary/Chest: Breath sounds normal. No respiratory distress. She has no wheezes. She has no rales.  Abdominal: Soft. Bowel sounds are normal. She exhibits no  distension and no mass. There is no tenderness. There is no rebound.  Musculoskeletal: She exhibits no edema.  No CVAT  Back Inspection: Normal appearance, no spinal deformity, symmetrical. Palpation: Localized discomfort R > L upper back and shoulder posteriorly and low back paraspinal R >L. No tenderness over spinous processes. With some paraspinal muscle hypertonicity and spasm ROM: Mostly full active ROM forward flex / back extension, rotation L/R with some mild discomfort with R rotation. - Also R shoulder some limited ROM forward flexion Special Testing: Seated SLR negative for radicular pain bilaterally  Strength: Bilateral hip flex/ext 5/5, knee flex/ext 5/5, ankle dorsiflex/plantarflex 5/5 Neurovascular: intact distal sensation to light touch  Neurological: She is alert and oriented to person, place, and time.  Skin: Skin is warm and dry. No rash noted. She is not diaphoretic. No erythema.  Psychiatric: She has a normal mood and affect. Her behavior is normal.  Well groomed, good eye contact, normal speech and thoughts  Nursing note and vitals reviewed.    Results for orders placed or performed in visit on 06/18/17  POCT urinalysis dipstick  Result Value Ref Range   Color, UA dark amber    Clarity, UA clear    Glucose, UA negative    Bilirubin, UA negative    Ketones, UA negative    Spec Grav, UA 1.010 1.010 - 1.025   Blood, UA negative    pH, UA 5.0 5.0 - 8.0   Protein, UA negative    Urobilinogen, UA 0.2 0.2 or 1.0 E.U./dL   Nitrite, UA negative    Leukocytes, UA Trace (A) Negative     Chemistry      Component Value Date/Time   NA 142 05/03/2015 1248   NA 139 08/29/2011 1246   K 5.1 05/03/2015 1248   K 4.8 08/29/2011 1246   CL 101 05/03/2015 1248   CL 106 08/29/2011 1246   CO2 26 05/03/2015 1248   CO2 26 08/29/2011 1246   BUN 12 05/03/2015 1248   BUN 15 08/29/2011 1246   CREATININE 1.20 (H) 11/24/2016 0813   CREATININE 1.14 08/29/2011 1246   GLU 80  10/19/2014      Component Value Date/Time   CALCIUM 9.4 05/03/2015 1248   CALCIUM 9.3 08/29/2011 1246   ALKPHOS 71 05/03/2015 1248   ALKPHOS 60 08/29/2011 1246   AST 31 05/03/2015 1248   AST 170 (H) 08/29/2011 1246   ALT 19 05/03/2015 1248   ALT 122 (H) 08/29/2011 1246   BILITOT 0.9 05/03/2015 1248   BILITOT 0.9 08/29/2011 1246         Assessment & Plan:   Problem List Items Addressed This Visit    Acute bilateral low back pain - Primary  Acute on chronic R upper and LBP without assoc sciatica. Suspect likely due to muscle spasm/strain, without known injury or trauma. In setting of known chronic LBP with DJD. Also now with acute either viral syndrome or other etiology see below - No red flag symptoms. Negative SLR for radiculopathy -  Improved with heat and Tylenol - Cannot take NSAIDs - Not consistent with nephrolithiasis based on history / exam  Plan: 1. Start trial on muscle relaxant with Baclofen 653m tabs - take 5-1553mup to TID PRN, titrate up as tolerated 2. Continue use Tylenol PRN for breakthrough 3. Encouraged use of heating pad 1-2x daily for now then PRN 4. HOLD repeat X-ray today - can reconsider in future 5. Check UA/Urine Culture to eval for possible UTI 6. Return criteria reviewed, follow-up 4-6 weeks if back is persistent but other symptoms resolve, consider repeat imaging x-ray, PT - possibly evaluated R shoulder further prior history of rotator cuff     Relevant Medications   baclofen (LIORESAL) 10 MG tablet   Other Relevant Orders   COMPLETE METABOLIC PANEL WITH GFR   CBC with Differential/Platelet   Malaise and fatigue Unclear exact etiology, seems may be with current illness possibly viral syndrome Improved today Actually afebrile Clinically not consistent with influenza, decline to test today    Osteoarthritis, multiple sites   Relevant Medications   baclofen (LIORESAL) 10 MG tablet   Urinary incontinence, urge Difficult to tell clinically if  UTI based on her urinary history. No endorsed dysuria, afebrile, back pain does not seem exactly related, seems more positional. - Check UA - showed mild leuks, otherwise negative. No evidence to support nephrolithiasis - Check Urine culture - Advised improve hydration    Relevant Orders   POCT urinalysis dipstick (Completed)    Other Visit Diagnoses    Elevated serum creatinine     Acute or subacute elevated Cr, last 1.4 (05/29/17) per [facility] Suspected poor hydration with limited PO intake water Prior Cr 1.0 to 1.2, had gradual increase since May 2018 Check BMET today now 2 weeks later to see Cr trend Also check CBC for possible infectious etiology    Relevant Orders   COMPLETE METABOLIC PANEL WITH GFR   Acute cystitis without hematuria     CONCERN for possible cystitis - will check culture to confirm HOLD antibiotics today, if symptoms worsen dysuria fever or UTI symptoms within 48-72 hours then will need to start antibiotic at that time, she may call or we will notify her if culture is positive, likely start with Keflex.    Relevant Orders   POCT urinalysis dipstick (Completed)   Urine Culture      Meds ordered this encounter  Medications  . baclofen (LIORESAL) 10 MG tablet    Sig: Take 0.5-1 tablets (5-10 mg total) by mouth 3 (three) times daily as needed for muscle spasms.    Dispense:  30 each    Refill:  0    Follow up plan: Return in about 2 weeks (around 07/02/2017), or if symptoms worsen or fail to improve, for viral syndrome vs back pain / ?UTI if not improved.  Reviewed plan with patient's daughter, Anita Bennett phone, advised them mostly negative UA and now waiting on Urine Culture, call back if not improved will reconsider antibiotic or will advise changes if Cr is elevated, improve hydration.  Additionally phoned in refill Xanax today to [facility] [id number] tabs daily PRN anxiety/migarine, +1 refill., She is almost out.  AleNobie PutnamDO [facility] 06/18/2017, 1:01 PM

## 2017-06-19 ENCOUNTER — Ambulatory Visit: Payer: Medicare Other | Admitting: Family Medicine

## 2017-06-21 ENCOUNTER — Other Ambulatory Visit: Payer: Self-pay | Admitting: Nurse Practitioner

## 2017-06-21 ENCOUNTER — Other Ambulatory Visit: Payer: Self-pay

## 2017-06-21 DIAGNOSIS — N3000 Acute cystitis without hematuria: Secondary | ICD-10-CM

## 2017-06-21 MED ORDER — CEPHALEXIN 500 MG PO CAPS: 500.0000 mg | ORAL_CAPSULE | Freq: Two times a day (BID) | ORAL | 0 refills | Status: AC

## 2017-06-21 MED ORDER — CEPHALEXIN 500 MG PO CAPS: 500.0000 mg | ORAL_CAPSULE | Freq: Two times a day (BID) | ORAL | 0 refills | Status: DC

## 2017-06-21 NOTE — Progress Notes (Signed)
Patient notified and resent rx to [facility]. AM

## 2017-06-22 LAB — URINE CULTURE
MICRO NUMBER:: 81331456
SPECIMEN QUALITY:: ADEQUATE

## 2017-07-05 DIAGNOSIS — R2689 Other abnormalities of gait and mobility: Secondary | ICD-10-CM | POA: Diagnosis not present

## 2017-07-05 DIAGNOSIS — G43119 Migraine with aura, intractable, without status migrainosus: Secondary | ICD-10-CM | POA: Diagnosis not present

## 2017-07-05 DIAGNOSIS — R0683 Snoring: Secondary | ICD-10-CM | POA: Diagnosis not present

## 2017-07-05 DIAGNOSIS — G3184 Mild cognitive impairment, so stated: Secondary | ICD-10-CM | POA: Diagnosis not present

## 2017-07-05 DIAGNOSIS — G250 Essential tremor: Secondary | ICD-10-CM | POA: Diagnosis not present

## 2017-07-18 DIAGNOSIS — S82025A Nondisplaced longitudinal fracture of left patella, initial encounter for closed fracture: Secondary | ICD-10-CM | POA: Diagnosis not present

## 2017-07-18 DIAGNOSIS — M25511 Pain in right shoulder: Secondary | ICD-10-CM | POA: Diagnosis not present

## 2017-07-18 DIAGNOSIS — M25552 Pain in left hip: Secondary | ICD-10-CM | POA: Diagnosis not present

## 2017-07-18 DIAGNOSIS — S63501A Unspecified sprain of right wrist, initial encounter: Secondary | ICD-10-CM | POA: Diagnosis not present

## 2017-07-23 DIAGNOSIS — S82025D Nondisplaced longitudinal fracture of left patella, subsequent encounter for closed fracture with routine healing: Secondary | ICD-10-CM | POA: Diagnosis not present

## 2017-07-23 DIAGNOSIS — M25552 Pain in left hip: Secondary | ICD-10-CM | POA: Diagnosis not present

## 2017-07-23 DIAGNOSIS — Z8614 Personal history of Methicillin resistant Staphylococcus aureus infection: Secondary | ICD-10-CM | POA: Diagnosis not present

## 2017-07-23 DIAGNOSIS — Z96653 Presence of artificial knee joint, bilateral: Secondary | ICD-10-CM | POA: Diagnosis not present

## 2017-07-23 DIAGNOSIS — W19XXXD Unspecified fall, subsequent encounter: Secondary | ICD-10-CM | POA: Diagnosis not present

## 2017-07-23 DIAGNOSIS — M75101 Unspecified rotator cuff tear or rupture of right shoulder, not specified as traumatic: Secondary | ICD-10-CM | POA: Diagnosis not present

## 2017-07-23 DIAGNOSIS — S63501D Unspecified sprain of right wrist, subsequent encounter: Secondary | ICD-10-CM | POA: Diagnosis not present

## 2017-07-23 DIAGNOSIS — Z8781 Personal history of (healed) traumatic fracture: Secondary | ICD-10-CM | POA: Diagnosis not present

## 2017-07-25 DIAGNOSIS — M25552 Pain in left hip: Secondary | ICD-10-CM | POA: Diagnosis not present

## 2017-07-25 DIAGNOSIS — W19XXXD Unspecified fall, subsequent encounter: Secondary | ICD-10-CM | POA: Diagnosis not present

## 2017-07-25 DIAGNOSIS — S63501D Unspecified sprain of right wrist, subsequent encounter: Secondary | ICD-10-CM | POA: Diagnosis not present

## 2017-07-25 DIAGNOSIS — S82025D Nondisplaced longitudinal fracture of left patella, subsequent encounter for closed fracture with routine healing: Secondary | ICD-10-CM | POA: Diagnosis not present

## 2017-07-25 DIAGNOSIS — Z96653 Presence of artificial knee joint, bilateral: Secondary | ICD-10-CM | POA: Diagnosis not present

## 2017-07-25 DIAGNOSIS — M75101 Unspecified rotator cuff tear or rupture of right shoulder, not specified as traumatic: Secondary | ICD-10-CM | POA: Diagnosis not present

## 2017-07-29 ENCOUNTER — Encounter: Payer: Self-pay | Admitting: Nurse Practitioner

## 2017-07-29 ENCOUNTER — Ambulatory Visit (INDEPENDENT_AMBULATORY_CARE_PROVIDER_SITE_OTHER): Payer: Medicare Other | Admitting: Nurse Practitioner

## 2017-07-29 ENCOUNTER — Other Ambulatory Visit: Payer: Self-pay

## 2017-07-29 VITALS — BP 151/63 | HR 60 | Temp 98.4°F | Resp 18 | Ht 66.0 in | Wt 221.6 lb

## 2017-07-29 DIAGNOSIS — J40 Bronchitis, not specified as acute or chronic: Secondary | ICD-10-CM | POA: Diagnosis not present

## 2017-07-29 DIAGNOSIS — J00 Acute nasopharyngitis [common cold]: Secondary | ICD-10-CM

## 2017-07-29 MED ORDER — ALBUTEROL SULFATE HFA 108 (90 BASE) MCG/ACT IN AERS: 1.0000 | INHALATION_SPRAY | Freq: Four times a day (QID) | RESPIRATORY_TRACT | 5 refills | Status: DC | PRN

## 2017-07-29 MED ORDER — BENZONATATE 100 MG PO CAPS: 100.0000 mg | ORAL_CAPSULE | Freq: Two times a day (BID) | ORAL | 0 refills | Status: DC | PRN

## 2017-07-29 NOTE — Patient Instructions (Addendum)
Anita Bennett, Thank you for coming in to clinic today.  1. It sounds like you have a Upper Respiratory Virus - this will most likely run it's course in 7 to 10 days. Recommend good hand washing. - Start Atrovent nasal spray decongestant 2 sprays each nostril up to 4 times daily for 5-7 days - Start anti-histamine Zyrtec or generic cetirizine 38m daily,  - also can use Flonase 2 sprays each nostril daily for up to 4-6 weeks  - If congestion is worse, start OTC Mucinex (or may try Mucinex-DM for cough) up to 7-10 days then stop - Drink plenty of fluids to improve congestion - You may try over the counter Nasal Saline spray (Simply Saline, Ocean Spray) as needed to reduce congestion. - Drink warm herbal tea with honey for sore throat. - Start taking Ibuprofen as well if tolerated 200-404mevery 8 hours as needed. - START benzonatate perles 100 mg twice daily for cough.  You also have some bronchitis: - You may also use an albuterol inhaler 1 puff every 6 hours. This may make your heart race some, so use only if needed. - Your cough could last 1-2 months.  If symptoms significantly worsening with persistent fevers/chills despite tylenol/ibpurofen, nausea, vomiting unable to tolerate food/fluids or medicine, body aches, or shortness of breath, sinus pain pressure or worsening productive cough, then follow-up for re-evaluation, may seek more immediate care at Urgent Care or ED if more concerned for emergency.  Please schedule a follow-up appointment with Anita SmilesAGNP. Return 5-7 days if symptoms worsen or fail to improve.  If you have any other questions or concerns, please feel free to call the clinic or send a message through [facility] [place] may also schedule an earlier appointment if necessary.  You will receive a survey after today's visit either digitally by e-mail or paper by USC.H. Robinson WorldwideYour experiences and feedback matter to usKorea Please respond so we know how we are doing as we provide care  for you.  Anita SmilesDNP, AGNP-BC Adult Gerontology Nurse Practitioner [facility]

## 2017-07-29 NOTE — Progress Notes (Signed)
Subjective:    Patient ID: Anita Bennett, female    DOB: 11/27/1940, 77 y.o.   MRN: [id number]  Anita Bennett is a 77 y.o. female presenting on 07/29/2017 for Cough (larygnitis, productive cough, nasal drainage, and  sneezing, x 2 days )   HPI Cough Pt reports symptoms of sore throat, hoarseness, productive cough, and sneezing over last 2 days.  Today is day 3 of symptoms.  Hoarseness is improving some today, but pt is still bothered by her cough. Pt states she is always cold and regularly has chills, but no new symptoms.  She has also had some sweats at night.  Otherwise, pt denies fever, nausea, vomiting, diarrhea and constipation.  - Is taking tylenol, but no improvement.  Has been taking advil some as well. - She has been in contact with her daughter and son-in-law who have also had similar symptoms and both had resolution of symptoms without antibiotics after several days.  Social History   Tobacco Use  . Smoking status: Never Smoker  . Smokeless tobacco: Never Used  Substance Use Topics  . Alcohol use: No  . Drug use: No    Review of Systems Per HPI unless specifically indicated above     Objective:    BP (!) 151/63 (BP Location: Right Arm, Patient Position: Sitting, Cuff Size: Large)   Pulse 60   Temp 98.4 F (36.9 C) (Oral)   Resp 18   Ht 5' 6"  (1.676 m)   Wt 221 lb 9.6 oz (100.5 kg)   SpO2 98%   BMI 35.77 kg/m   Wt Readings from Last 3 Encounters:  07/29/17 221 lb 9.6 oz (100.5 kg)  06/18/17 217 lb (98.4 kg)  05/22/17 217 lb 9.6 oz (98.7 kg)    Physical Exam  Constitutional: She is oriented to person, place, and time. She appears well-developed and well-nourished. She appears distressed (mild, sickly appearance).  HENT:  Head: Normocephalic and atraumatic.  Right Ear: Hearing, tympanic membrane, external ear and ear canal normal.  Left Ear: Hearing, tympanic membrane, external ear and ear canal normal.  Nose: Mucosal edema and rhinorrhea present. Right  sinus exhibits maxillary sinus tenderness and frontal sinus tenderness. Left sinus exhibits maxillary sinus tenderness and frontal sinus tenderness.  Mouth/Throat: Uvula is midline and mucous membranes are normal. Posterior oropharyngeal erythema present.  Eyes: Conjunctivae are normal. Pupils are equal, round, and reactive to light.  Neck: Normal range of motion. Neck supple.  Cardiovascular: Normal rate, regular rhythm, normal heart sounds and intact distal pulses.  Pulmonary/Chest: Effort normal. No respiratory distress. She has no decreased breath sounds. She has no wheezes. She has rhonchi (clears with cough) in the right lower field and the left lower field. She has no rales.  Negative egophony in all lobes  Abdominal: Soft. Bowel sounds are normal. She exhibits no distension.  Lymphadenopathy:    She has cervical adenopathy.  Neurological: She is alert and oriented to person, place, and time.  Skin: Skin is warm and dry.  Psychiatric: She has a normal mood and affect. Her behavior is normal. Judgment and thought content normal.  Vitals reviewed.     Assessment & Plan:   Problem List Items Addressed This Visit    None    Visit Diagnoses    Bronchitis    -  Primary Acute nasopharyngitis Acute illness. Fever responsive to NSAIDs and tylenol.  Symptoms not worsening. Consistent with viral illness x 3 days with two known sick contacts and no  identifiable focal infections of ears, nose, throat, lungs.  Plan: 1. Reassurance, likely self-limited with cough lasting up to few weeks.  Bronchitis will worsen and lengthen duration of cough. - Start anti-histamine cetirizine 34m daily,  - also can use Flonase 2 sprays each nostril daily for up to 4-6 weeks - Start Mucinex-DM OTC up to 7-10 days then stop - Start tessalon perles 100 mg every 12 hours as needed for cough. - START albuterol 1 puff every 6 hours prn cough/wheezing if not resolved with above treatments.  Cautioned heart racing  and encouraged pt only use if necessary. 2. Supportive care with nasal saline, warm herbal tea with honey, 3. Improve hydration 4. Tylenol / Motrin PRN fevers 5. Return criteria given - Defer Xray today 2/2 negative egophony and low suspicion for pneumonia.   Relevant Medications   benzonatate (TESSALON) 100 MG capsule   albuterol (PROVENTIL HFA;VENTOLIN HFA) 108 (90 Base) MCG/ACT inhaler             Meds ordered this encounter  Medications  . benzonatate (TESSALON) 100 MG capsule    Sig: Take 1 capsule (100 mg total) by mouth 2 (two) times daily as needed for cough.    Dispense:  20 capsule    Refill:  0    Order Specific Question:   Supervising Provider    Answer:   KOlin Hauser[[id number]]  . albuterol (PROVENTIL HFA;VENTOLIN HFA) 108 (90 Base) MCG/ACT inhaler    Sig: Inhale 1 puff into the lungs every 6 (six) hours as needed for wheezing or shortness of breath.    Dispense:  1 Inhaler    Refill:  5    Order Specific Question:   Supervising Provider    Answer:   KOlin Hauser[[id number]]    Follow up plan: Return 5-7 days if symptoms worsen or fail to improve.  LCassell Smiles DNP, AGPCNP-BC Adult Gerontology Primary Care Nurse Practitioner [facility] 07/29/2017, 4:34 PM

## 2017-07-30 DIAGNOSIS — S82025D Nondisplaced longitudinal fracture of left patella, subsequent encounter for closed fracture with routine healing: Secondary | ICD-10-CM | POA: Diagnosis not present

## 2017-07-30 DIAGNOSIS — M25511 Pain in right shoulder: Secondary | ICD-10-CM | POA: Diagnosis not present

## 2017-07-31 DIAGNOSIS — M25552 Pain in left hip: Secondary | ICD-10-CM | POA: Diagnosis not present

## 2017-07-31 DIAGNOSIS — Z96653 Presence of artificial knee joint, bilateral: Secondary | ICD-10-CM | POA: Diagnosis not present

## 2017-07-31 DIAGNOSIS — W19XXXD Unspecified fall, subsequent encounter: Secondary | ICD-10-CM | POA: Diagnosis not present

## 2017-07-31 DIAGNOSIS — M75101 Unspecified rotator cuff tear or rupture of right shoulder, not specified as traumatic: Secondary | ICD-10-CM | POA: Diagnosis not present

## 2017-07-31 DIAGNOSIS — S63501D Unspecified sprain of right wrist, subsequent encounter: Secondary | ICD-10-CM | POA: Diagnosis not present

## 2017-07-31 DIAGNOSIS — S82025D Nondisplaced longitudinal fracture of left patella, subsequent encounter for closed fracture with routine healing: Secondary | ICD-10-CM | POA: Diagnosis not present

## 2017-08-01 DIAGNOSIS — S82025D Nondisplaced longitudinal fracture of left patella, subsequent encounter for closed fracture with routine healing: Secondary | ICD-10-CM | POA: Diagnosis not present

## 2017-08-01 DIAGNOSIS — W19XXXD Unspecified fall, subsequent encounter: Secondary | ICD-10-CM | POA: Diagnosis not present

## 2017-08-01 DIAGNOSIS — S63501D Unspecified sprain of right wrist, subsequent encounter: Secondary | ICD-10-CM | POA: Diagnosis not present

## 2017-08-01 DIAGNOSIS — M75101 Unspecified rotator cuff tear or rupture of right shoulder, not specified as traumatic: Secondary | ICD-10-CM | POA: Diagnosis not present

## 2017-08-01 DIAGNOSIS — Z96653 Presence of artificial knee joint, bilateral: Secondary | ICD-10-CM | POA: Diagnosis not present

## 2017-08-01 DIAGNOSIS — M25552 Pain in left hip: Secondary | ICD-10-CM | POA: Diagnosis not present

## 2017-08-05 DIAGNOSIS — M25552 Pain in left hip: Secondary | ICD-10-CM | POA: Diagnosis not present

## 2017-08-05 DIAGNOSIS — W19XXXD Unspecified fall, subsequent encounter: Secondary | ICD-10-CM | POA: Diagnosis not present

## 2017-08-05 DIAGNOSIS — Z96653 Presence of artificial knee joint, bilateral: Secondary | ICD-10-CM | POA: Diagnosis not present

## 2017-08-05 DIAGNOSIS — S63501D Unspecified sprain of right wrist, subsequent encounter: Secondary | ICD-10-CM | POA: Diagnosis not present

## 2017-08-05 DIAGNOSIS — M75101 Unspecified rotator cuff tear or rupture of right shoulder, not specified as traumatic: Secondary | ICD-10-CM | POA: Diagnosis not present

## 2017-08-05 DIAGNOSIS — S82025D Nondisplaced longitudinal fracture of left patella, subsequent encounter for closed fracture with routine healing: Secondary | ICD-10-CM | POA: Diagnosis not present

## 2017-08-07 DIAGNOSIS — S63501D Unspecified sprain of right wrist, subsequent encounter: Secondary | ICD-10-CM | POA: Diagnosis not present

## 2017-08-07 DIAGNOSIS — S82025D Nondisplaced longitudinal fracture of left patella, subsequent encounter for closed fracture with routine healing: Secondary | ICD-10-CM | POA: Diagnosis not present

## 2017-08-07 DIAGNOSIS — Z96653 Presence of artificial knee joint, bilateral: Secondary | ICD-10-CM | POA: Diagnosis not present

## 2017-08-07 DIAGNOSIS — M25552 Pain in left hip: Secondary | ICD-10-CM | POA: Diagnosis not present

## 2017-08-07 DIAGNOSIS — M75101 Unspecified rotator cuff tear or rupture of right shoulder, not specified as traumatic: Secondary | ICD-10-CM | POA: Diagnosis not present

## 2017-08-07 DIAGNOSIS — W19XXXD Unspecified fall, subsequent encounter: Secondary | ICD-10-CM | POA: Diagnosis not present

## 2017-08-14 DIAGNOSIS — M25552 Pain in left hip: Secondary | ICD-10-CM | POA: Diagnosis not present

## 2017-08-14 DIAGNOSIS — S82025D Nondisplaced longitudinal fracture of left patella, subsequent encounter for closed fracture with routine healing: Secondary | ICD-10-CM | POA: Diagnosis not present

## 2017-08-14 DIAGNOSIS — Z96653 Presence of artificial knee joint, bilateral: Secondary | ICD-10-CM | POA: Diagnosis not present

## 2017-08-14 DIAGNOSIS — M75101 Unspecified rotator cuff tear or rupture of right shoulder, not specified as traumatic: Secondary | ICD-10-CM | POA: Diagnosis not present

## 2017-08-14 DIAGNOSIS — S63501D Unspecified sprain of right wrist, subsequent encounter: Secondary | ICD-10-CM | POA: Diagnosis not present

## 2017-08-14 DIAGNOSIS — W19XXXD Unspecified fall, subsequent encounter: Secondary | ICD-10-CM | POA: Diagnosis not present

## 2017-08-20 DIAGNOSIS — S82002G Unspecified fracture of left patella, subsequent encounter for closed fracture with delayed healing: Secondary | ICD-10-CM | POA: Diagnosis not present

## 2017-08-22 ENCOUNTER — Ambulatory Visit: Payer: Medicare Other | Admitting: Family Medicine

## 2017-09-03 DIAGNOSIS — S82002G Unspecified fracture of left patella, subsequent encounter for closed fracture with delayed healing: Secondary | ICD-10-CM | POA: Diagnosis not present

## 2017-09-24 DIAGNOSIS — S82001D Unspecified fracture of right patella, subsequent encounter for closed fracture with routine healing: Secondary | ICD-10-CM | POA: Diagnosis not present

## 2017-09-26 DIAGNOSIS — K7581 Nonalcoholic steatohepatitis (NASH): Secondary | ICD-10-CM | POA: Diagnosis not present

## 2017-09-26 DIAGNOSIS — K7460 Unspecified cirrhosis of liver: Secondary | ICD-10-CM | POA: Diagnosis not present

## 2017-11-02 IMAGING — MR MR HEAD W/O CM
10 series · 48 of 48 positions shown · non-contrast
Comparison: 07/29/2009

CLINICAL DATA: Unsteady gait and memory loss over the last 6
months.

EXAM:
MRI HEAD WITHOUT CONTRAST
TECHNIQUE: Multiplanar, multiecho pulse sequences of the brain and surrounding
structures were obtained without intravenous contrast.

[Series 2: T1 · sagittal · 5.0mm · 0.45mm/px · 3 of 23 slices shown (1 of 2)]
[im 1/23]
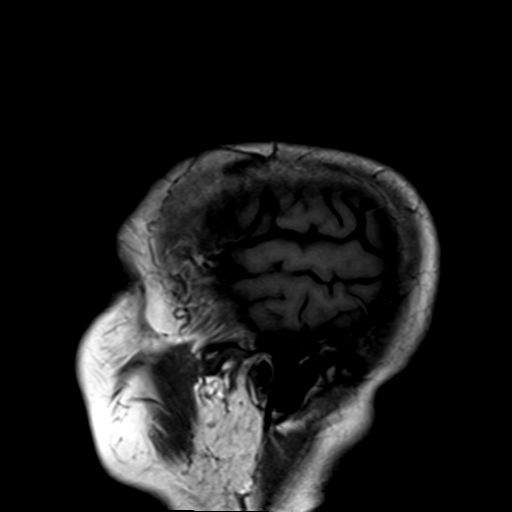
[im 12/23]
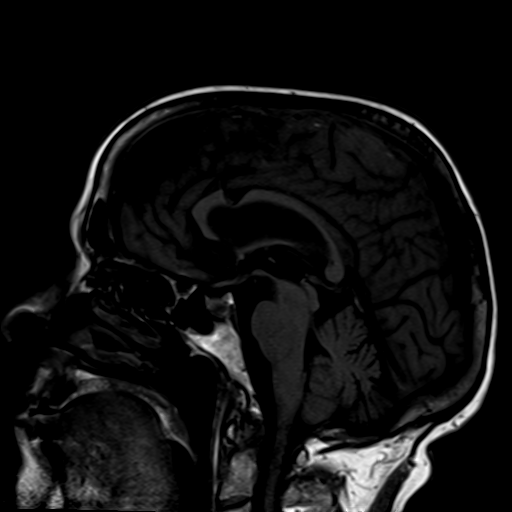
[im 23/23]
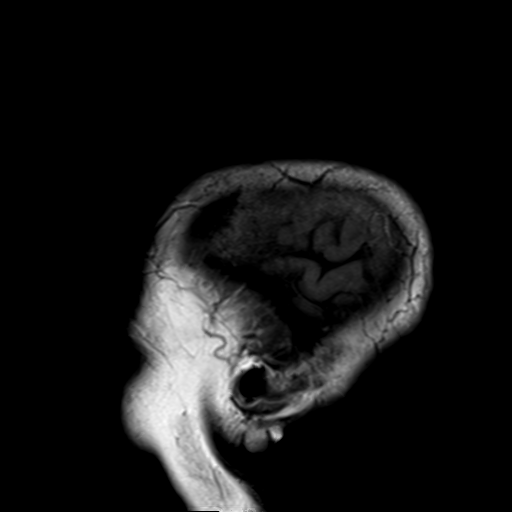

[Series 4: DWI · axial · 3.0mm · 1.80mm/px · z∈[-86,+72]mm · 6 of 55 slices shown (1 of 2)]
[im 1/55]
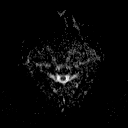
[im 11/55]
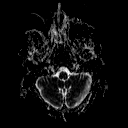
[im 22/55]
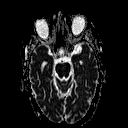
[im 33/55]
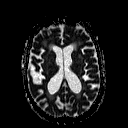
[im 44/55]
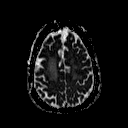
[im 55/55]
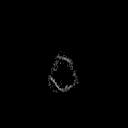

[Series 6: DWI · coronal · 3.0mm · 1.80mm/px · 5 of 45 slices shown (2 of 2)]
[im 1/45]
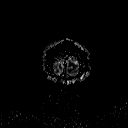
[im 12/45]
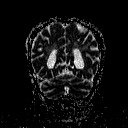
[im 23/45]
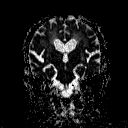
[im 34/45]
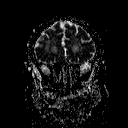
[im 45/45]
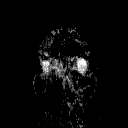

[Series 7: T2 · axial · 5.0mm · 0.60mm/px · z∈[-79,+66]mm · 2 of 24 slices shown (1 of 3)]
[im 1/24]
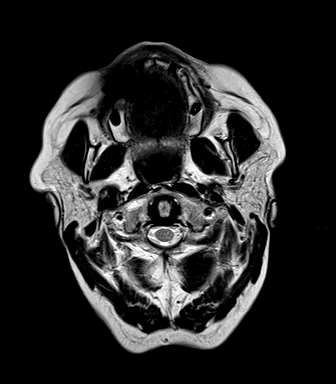
[im 24/24]
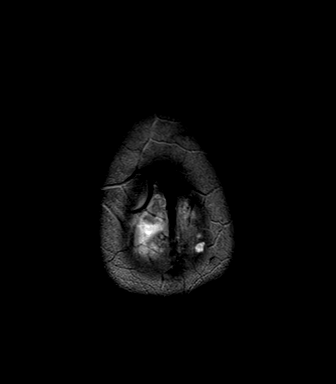

[Series 8: FLAIR · axial · 5.0mm · 0.45mm/px · z∈[-76,+63]mm · 2 of 23 slices shown]
[im 1/23]
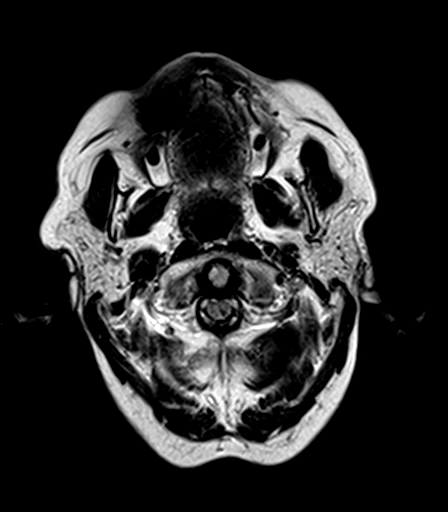
[im 23/23]
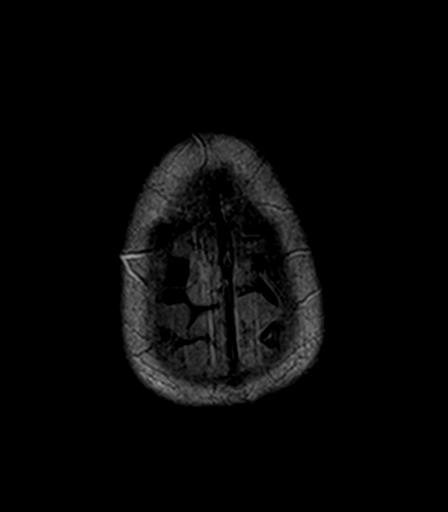

[Series 9: T2 · axial · 5.0mm · 0.45mm/px · z∈[-76,+63]mm · 2 of 23 slices shown (2 of 3)]
[im 1/23]
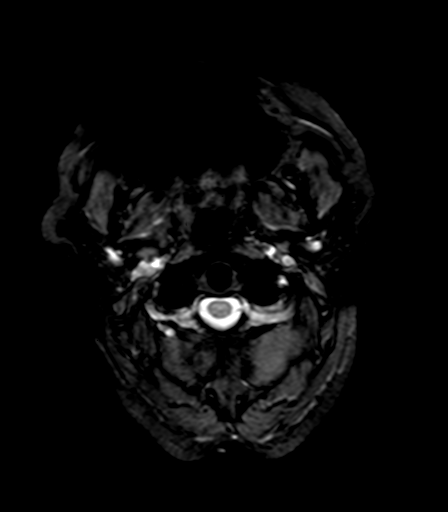
[im 23/23]
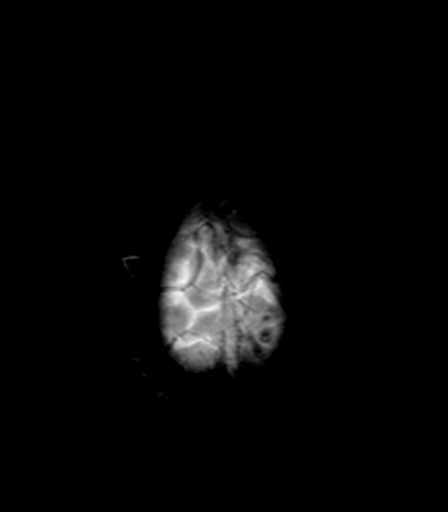

[Series 10: T1 · axial · 3.0mm · 1.00mm/px · z∈[-77,+71]mm · 5 of 52 slices shown (2 of 2)]
[im 1/52]
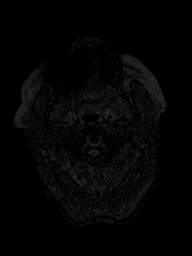
[im 13/52]
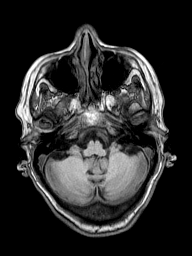
[im 26/52]
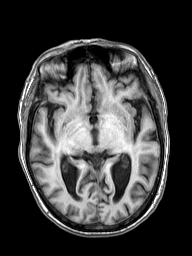
[im 39/52]
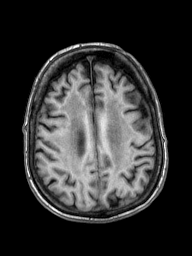
[im 52/52]
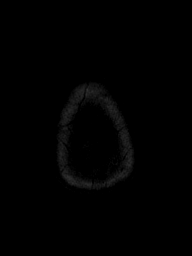

[Series 11: T2 · coronal · 5.0mm · 0.49mm/px · 3 of 27 slices shown (3 of 3)]
[im 1/27]
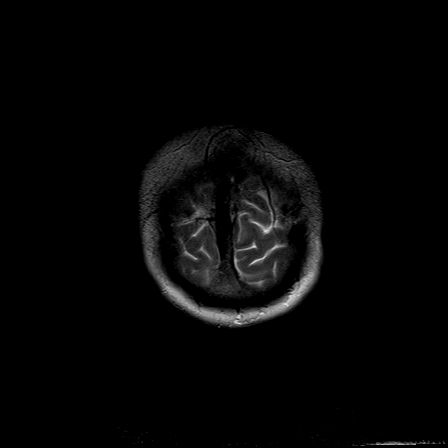
[im 14/27]
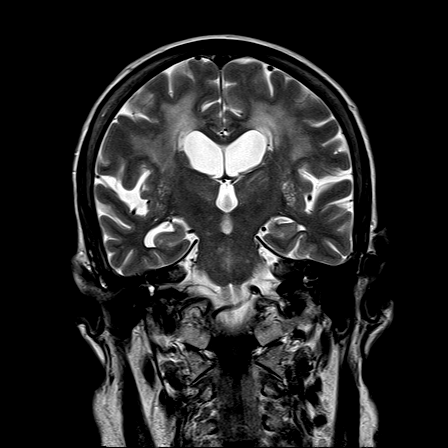
[im 27/27]
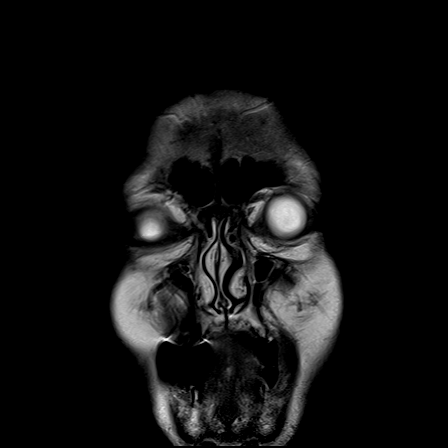

[Series 100: (id) · axial · 3.0mm · 1.80mm/px · z∈[-86,+72]mm · 16 of 165 slices shown]
[im 1/165]
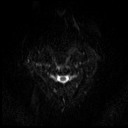
[im 11/165]
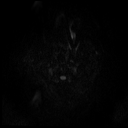
[im 22/165]
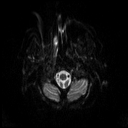
[im 33/165]
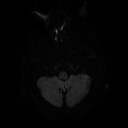
[im 44/165]
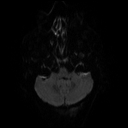
[im 55/165]
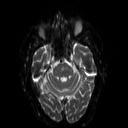
[im 66/165]
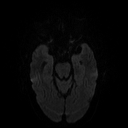
[im 77/165]
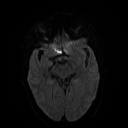
[im 88/165]
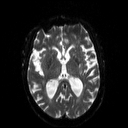
[im 99/165]
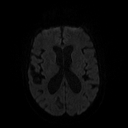
[im 110/165]
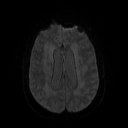
[im 121/165]
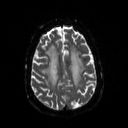
[im 132/165]
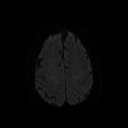
[im 143/165]
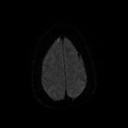
[im 154/165]
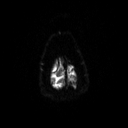
[im 165/165]
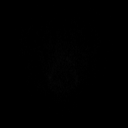

[Series 101: (id) cor · coronal · 3.0mm · 1.80mm/px · 4 of 44 slices shown]
[im 1/44]
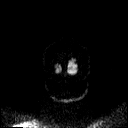
[im 15/44]
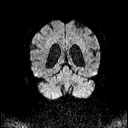
[im 29/44]
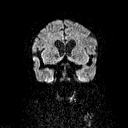
[im 44/44]
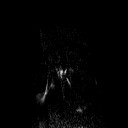

[48 of 48 positions shown; findings below may reference images not displayed]

FINDINGS: There chronic small-vessel ischemic changes throughout the pons. No
focal cerebellar insult. Cerebral hemispheres show chronic
small-vessel ischemic changes affecting the thalami, basal ganglia
and to an advanced degree throughout the cerebral hemispheric white
matter. No cortical or large vessel territory infarction. Diffusion
imaging does not suggest that any of these insults is acute or
subacute. No mass lesion, hemorrhage, hydrocephalus or extra-axial
collection. No pituitary mass. No inflammatory sinus disease. No
skull or skullbase lesion. Major vessels at the base of the brain
show flow.

Compared to the study of 5577, the small-vessel ischemic changes
show considerable progression.
IMPRESSION: Chronic small-vessel ischemic changes throughout the brain,
considerably progressive since 5577. No sign of acute or subacute
insult. No large vessel stroke. No tumor.

## 2017-11-27 DIAGNOSIS — K7581 Nonalcoholic steatohepatitis (NASH): Secondary | ICD-10-CM | POA: Diagnosis not present

## 2017-11-27 DIAGNOSIS — M25562 Pain in left knee: Secondary | ICD-10-CM | POA: Diagnosis not present

## 2017-11-27 DIAGNOSIS — E785 Hyperlipidemia, unspecified: Secondary | ICD-10-CM | POA: Diagnosis not present

## 2017-11-27 DIAGNOSIS — M25511 Pain in right shoulder: Secondary | ICD-10-CM | POA: Diagnosis not present

## 2017-11-27 DIAGNOSIS — G8929 Other chronic pain: Secondary | ICD-10-CM | POA: Diagnosis not present

## 2017-11-27 DIAGNOSIS — M25512 Pain in left shoulder: Secondary | ICD-10-CM | POA: Diagnosis not present

## 2017-11-27 DIAGNOSIS — E039 Hypothyroidism, unspecified: Secondary | ICD-10-CM | POA: Diagnosis not present

## 2017-11-27 DIAGNOSIS — K7460 Unspecified cirrhosis of liver: Secondary | ICD-10-CM | POA: Diagnosis not present

## 2017-11-29 LAB — BASIC METABOLIC PANEL
Albumin: 3.8
Alpha Fetoprotein(AFP)Non-Matrnl/TM-QstL: 3.3 (ref ?–9)
BUN: 7 (ref 4–21)
CREATININE: 1 (ref 0.5–1.1)
Calcium: 8.9
Carbon Dioxide, Total: 24
Chloride: 105
GFR CALC NON AF AMER: 54
Glucose: 96
POTASSIUM: 4.1 (ref 3.4–5.3)
Sodium: 138 (ref 137–147)
TOTAL PROTEIN: 6.9 g/dL
ZINC: 66 (ref 56–134)

## 2017-11-29 LAB — CBC AND DIFFERENTIAL
HEMATOCRIT: 39 (ref 36–46)
Hemoglobin: 12.8 (ref 12.0–16.0)
Neutrophils Absolute: 2
Platelets: 172 (ref 150–399)
WBC: 5.4

## 2017-11-29 LAB — HEPATIC FUNCTION PANEL
ALK PHOS: 47 (ref 25–125)
ALT: 29 (ref 7–35)
AST: 43 — AB (ref 13–35)
Bilirubin, Total: 2.1

## 2017-11-29 LAB — PROTIME-INR: Protime: 12.8 (ref 10.0–13.8)

## 2017-12-04 NOTE — Progress Notes (Signed)
Encounter created in error. Lab result abstract only. Patient was not seen in office for visit.  Nobie Putnam, [facility] 12/04/2017, 6:05 PM

## 2017-12-10 ENCOUNTER — Ambulatory Visit: Payer: Medicare Other | Attending: Gastroenterology

## 2017-12-10 DIAGNOSIS — M6281 Muscle weakness (generalized): Secondary | ICD-10-CM | POA: Insufficient documentation

## 2017-12-10 DIAGNOSIS — G8929 Other chronic pain: Secondary | ICD-10-CM | POA: Insufficient documentation

## 2017-12-10 DIAGNOSIS — M25562 Pain in left knee: Secondary | ICD-10-CM | POA: Diagnosis not present

## 2017-12-10 DIAGNOSIS — M25511 Pain in right shoulder: Secondary | ICD-10-CM | POA: Diagnosis not present

## 2017-12-10 DIAGNOSIS — Z9181 History of falling: Secondary | ICD-10-CM | POA: Insufficient documentation

## 2017-12-10 DIAGNOSIS — M25512 Pain in left shoulder: Secondary | ICD-10-CM | POA: Insufficient documentation

## 2017-12-10 DIAGNOSIS — R262 Difficulty in walking, not elsewhere classified: Secondary | ICD-10-CM | POA: Insufficient documentation

## 2017-12-10 NOTE — Therapy (Signed)
[name] [address], [place], [address] Phone: [phone]   Fax:  [phone]  Physical Therapy Evaluation  Patient Details  Name: Anita Bennett MRN: [id number] Date of Birth: 03/16/1941 Referring Provider: Cornelius Moras, NP   Encounter Date: 12/10/2017  PT End of Session - 12/10/17 1022    Visit Number  1    Number of Visits  17    Date for PT Re-Evaluation  02/06/18    Authorization Type  1    Authorization Time Period  of 10    PT Start Time  1022    PT Stop Time  1122    PT Time Calculation (min)  60 min    Equipment Utilized During Treatment  Gait belt    Activity Tolerance  Patient tolerated treatment well    Behavior During Therapy  WFL for tasks assessed/performed       Past Medical History:  Diagnosis Date  . Arrhythmia   . H/O knee surgery    2011 [facility] (replacement)  . Heart murmur   . History of kidney stones   . History of toe surgery    12/01/14 [facility]  . Hyperlipidemia   . Hypertension   . Thyroid disease     Past Surgical History:  Procedure Laterality Date  . arm surgery    . BACK SURGERY    . CHOLECYSTECTOMY    . TOE SURGERY    . TOTAL ABDOMINAL HYSTERECTOMY    . TOTAL KNEE ARTHROPLASTY Bilateral     There were no vitals filed for this visit.   Subjective Assessment - 12/10/17 1025    Subjective  L knee: 0/10 currently (pt sitting and resting with leg straight), 7/10 at worst for the past 2 months; R shoulder: 3/10 when moving her R arm,  5/10 at most for the past 2 months. L shoulder: 3/10 when rasing her L arm 5/10 at most for the past 2 months.  pt has a difficult time remembering    Pertinent History  Pt states falling onto her L knee around December 27/28, 2018. Pt states she has bad balance. Pt stepped off the curb and fell. Pt landed onto her hands and L knee.  Pt fractured her L patella during the fall. Pt also states that she also had a tear in her R  rotator cuff prior to her fall. L shoulder hurts but does not know if she hurt it during the fall. Raising her arms up bothers her. L knee still bothers her.   Pt was in a knee brace L LE for 2.5 months after the fall.  Pt states that her doctor thought it was healing around March 2019. Had home health PT until towards the end of March.  Going back to [facility] to see her knee surgeon at the last week of May, 2019.  Pt also states that she has memory difficulties due to cirrhosis.  L knee bothers her more than her shoulders.  L knee feels like it tingles or stings when something touches it.  Uses a SPC for longer distance walking since after the fall. [facility] [address] again after returning home from her doctor's visit in March.  Pt was gently pushing her great granddaughter on a swing. No other falls for the last 6 months.   Pt states having a fear of falling.   R shoulder bothers her the most compared to L. Pt is R hand dominant.  Patient Stated Goals  Pick up her grandchildren with less pain. Be able to walk better out in the yard. Improve Balance    Currently in Pain?  No/denies    Pain Score  3  R shoulder    Pain Location  -- Bilateral shoulders, L knee    Pain Orientation  Right;Left    Pain Type  Chronic pain    Pain Onset  More than a month ago    Pain Frequency  Occasional    Aggravating Factors   L knee: getting into and out of bed, getting into and out of her car, bending her L knee, stairs; R shoulder pushing herself out of bed, raising her R arm, R wrist flexion    Pain Relieving Factors  L knee: staying still, tylenol, ice, heat; shoulder: rest, ice, heat         [facility] PT Assessment - 12/10/17 0001      Assessment   Medical Diagnosis  L knee and bilateral shoulder pain, balance difficulty    Referring Provider  Cornelius Moras, NP    Onset Date/Surgical Date  07/18/17    Hand Dominance  Right    Prior Therapy  No known PT for current condition      Precautions   Precaution Comments  fall risk       Restrictions   Other Position/Activity Restrictions  No known weight bearing restrictions      Balance Screen   Has the patient fallen in the past 6 months  Yes    How many times?  2    Has the patient had a decrease in activity level because of a fear of falling?   Yes    Is the patient reluctant to leave their home because of a fear of falling?   Yes      Home Environment   Additional Comments  Pt lives in a 1 story home but the laundry room is down stairs, R rail assist to go down 12 steps.  4 steps to enter front door bilateral rail but rails are far apart.  5 steps to enter back door with bilateral rail       Prior Function   Vocation Requirements  PLOF: better able to get into and out of bed, into and out of a car, negotiate stairs, raise her arms reach, push with less pain      Observation/Other Assessments   Observations  TUG: 11 seconds (decreased bilateral heel strike), 9 seconds (unsteady), 9 seconds (9.67 seconds average), no AD      Posture/Postural Control   Posture Comments  wide base of support, slight R lateral shift, bilaterally protracted shoulders L > R, R LE weight shift      AROM   Right Shoulder Flexion  129 Degrees with anterior shoulder pain    Left Shoulder Flexion  115 Degrees with anterior arm and shoulder pain    Right Knee Extension  -5    Right Knee Flexion  102    Left Knee Extension  -8    Left Knee Flexion  111      Strength   Right Shoulder Internal Rotation  4+/5    Right Shoulder External Rotation  4/5    Left Shoulder Internal Rotation  4+/5    Left Shoulder External Rotation  4/5    Right Hip Flexion  4-/5    Right Hip Extension  4/5 seated manually resisted hip extension    Right Hip ABduction  4+/5 seated manually resisted clamshell isometrics    Left Hip Flexion  4/5    Left Hip Extension  4-/5 seated manually resisted hip extension; L knee pain    Left Hip ABduction  4+/5 seated manually resisted clamshell isometrics    Right  Knee Flexion  5/5    Right Knee Extension  5/5    Left Knee Flexion  4/5    Left Knee Extension  4+/5      Palpation   Palpation comment  TTP L distal rectus femoris, L medial hamstrings. Decreased sensation to light touch anterior knee (superficial to patella)      Ambulation/Gait   Gait Comments  Antalgic, decreased stance L LE, forward flexed, decreased bilateral hip extension                 Objective measurements completed on examination: See above findings.   R shoulder pain with R wrist flexion Pt was recommended to use her [facility] for safety. Pt verbalized understanding.       Patient is a 77 year old female who came to physical therapy secondary to L knee and bilateral shoulder pain secondary to a fall. She also presents with altered gait pattern and posture, bilateral hip weakness, TTP, decreased balance (based on subjective), limited bilateral shoulder flexion AROM, bilateral shoulder weakness, and difficulty performing functional tasks due to pain. Patient will benefit from skilled physical therapy services to address the aforementioned deficits.    PT Education - 12/10/17 1239    Education provided  Yes    Education Details  plan of care    Person(s) Educated  Patient    Methods  Explanation    Comprehension  Verbalized understanding          PT Long Term Goals - 12/10/17 1249      PT LONG TERM GOAL #1   Title  Patient will have a decrease in L knee pain to 4/10 at worst to promote ability to ambulate and perform transfers more comfortably.     Baseline  7/10 L knee pain at worst for the past 2 months (12/10/2017)    Time  8    Period  Weeks    Status  New    Target Date  02/06/18      PT LONG TERM GOAL #2   Title  Patient will improve L LE strength by at least 1/2 MMT to promote ability to perform standing tasks.     Time  8    Period  Weeks    Status  New    Target Date  02/06/18      PT LONG TERM GOAL #3   Title  Patient will have a decrease in  R shoulder pain to 2/10 or less at worst, and L shoulder pain and 2/10 at worst to promote ability to raise her arms up.     Baseline  5/10 bilateral shoulder pain at worst (12/10/2017)    Time  8    Period  Weeks    Status  New    Target Date  02/06/18      PT LONG TERM GOAL #4   Title  Patient will improve bilateral shoulder ER strength to promote ability to raise her arms up more comfortably.     Time  8    Period  Weeks    Status  New    Target Date  02/06/18      PT LONG TERM GOAL #5  Title  Pt will be able to perform TUG in 11 seconds or less safely to promote balance.     Baseline  9.67 seconds average without AD but unsteady, CGA to SBA (12/10/2017)    Time  8    Period  Weeks    Status  New    Target Date  02/06/18             Plan - 12/10/17 1240    Clinical Impression Statement  Patient is a 77 year old female who came to physical therapy secondary to L knee and bilateral shoulder pain secondary to a fall. She also presents with altered gait pattern and posture, bilateral hip weakness, TTP, decreased balance (based on subjective), limited bilateral shoulder flexion AROM, bilateral shoulder weakness, and difficulty performing functional tasks due to pain. Patient will benefit from skilled physical therapy services to address the aforementioned deficits.     History and Personal Factors relevant to plan of care:  Decreased balance, multiple areas of pain (R and L shoulder, L knee), decreased balance, difficulty raising both arms, weakness, difficulty walking, difficulty getting into and out of bed, the car, difficulty negotiating stairs    Clinical Presentation  Stable    Clinical Presentation due to:  pain might be a little better since injury    Clinical Decision Making  Low    Rehab Potential  Fair    Clinical Impairments Affecting Rehab Potential  Decreased balance, multiple areas of pain (R and L shoulder, L knee), decreased balance, difficulty raising both arms,  weakness, difficulty walking, difficulty getting into and out of bed, the car, difficulty negotiating stairs    PT Frequency  2x / week    PT Duration  8 weeks    PT Treatment/Interventions  Therapeutic activities;Therapeutic exercise;Neuromuscular re-education;Patient/family education;Manual techniques;Dry needling;Aquatic Therapy;Electrical Stimulation;Iontophoresis 72m/ml Dexamethasone;Gait training    PT Next Visit Plan  glute med and max strengthening, scapular and ER muscle strengthening, manual techniques, modalities PRN    Consulted and Agree with Plan of Care  Patient       Patient will benefit from skilled therapeutic intervention in order to improve the following deficits and impairments:  Pain, Postural dysfunction, Improper body mechanics, Decreased strength, Difficulty walking, Decreased range of motion, Decreased balance  Visit Diagnosis: Chronic pain of left knee - Plan: PT plan of care cert/re-cert  Chronic right shoulder pain - Plan: PT plan of care cert/re-cert  Chronic left shoulder pain - Plan: PT plan of care cert/re-cert  Muscle weakness (generalized) - Plan: PT plan of care cert/re-cert  History of falling - Plan: PT plan of care cert/re-cert  Difficulty in walking, not elsewhere classified - Plan: PT plan of care cert/re-cert     Problem List Patient Active Problem List   Diagnosis Date Noted  . Acute bilateral low back pain 06/18/2017  . Aortic heart murmur on examination 12/06/2015  . Urinary incontinence, urge 12/06/2015  . Gait instability 12/06/2015  . Shortness of breath 12/06/2015  . Memory loss 05/03/2015  . Allergic rhinitis 05/02/2015  . Blood glucose elevated 05/02/2015  . HLD (hyperlipidemia) 05/02/2015  . Essential hypertension 05/02/2015  . Adult hypothyroidism 05/02/2015  . Malaise and fatigue 05/02/2015  . Mild mitral regurgitation by prior echocardiogram 05/02/2015  . Obesity (BMI 35.0-39.9 without comorbidity) 05/02/2015  .  Osteoarthritis, multiple sites 05/02/2015  . Anxiety 01/27/2015  . Liver cirrhosis secondary to NASH ([place]patitis[place] 01/12[place]  . Hammer toe 11/18/2014  . Hallux rigidus of right  foot 11/18/2014  . Hallux rigidus of left foot 11/18/2014  . Chronic right shoulder pain 10/26/2014  . Peripheral nerve disease 10/22/2012    Joneen Boers PT, DPT   12/10/2017, 7:16 PM  [facility] [address] [place], [address] Phone: [phone]   Fax:  [phone]  Name: Anita Bennett MRN: [id number] Date of Birth: 07-02-1941

## 2017-12-16 ENCOUNTER — Other Ambulatory Visit: Payer: Self-pay | Admitting: Family Medicine

## 2017-12-16 DIAGNOSIS — F419 Anxiety disorder, unspecified: Secondary | ICD-10-CM

## 2017-12-18 ENCOUNTER — Ambulatory Visit: Payer: Medicare Other

## 2017-12-18 DIAGNOSIS — M25511 Pain in right shoulder: Secondary | ICD-10-CM | POA: Diagnosis not present

## 2017-12-18 DIAGNOSIS — M25512 Pain in left shoulder: Secondary | ICD-10-CM | POA: Diagnosis not present

## 2017-12-18 DIAGNOSIS — G8929 Other chronic pain: Secondary | ICD-10-CM

## 2017-12-18 DIAGNOSIS — M25562 Pain in left knee: Secondary | ICD-10-CM | POA: Diagnosis not present

## 2017-12-18 DIAGNOSIS — Z9181 History of falling: Secondary | ICD-10-CM

## 2017-12-18 DIAGNOSIS — M6281 Muscle weakness (generalized): Secondary | ICD-10-CM | POA: Diagnosis not present

## 2017-12-18 NOTE — Therapy (Signed)
[facility] [address], [place], [address] Phone: [phone]   Fax:  [phone]  Physical Therapy Treatment  Patient Details  Name: Anita Bennett MRN: [id number] Date of Birth: [birth year] Referring Provider: Cornelius Moras, NP   Encounter Date: 12/18/2017  PT End of Session - 12/18/17 1349    Visit Number  2    Number of Visits  17    Date for PT Re-Evaluation  02/06/18    Authorization Type  2    Authorization Time Period  of 10    PT Start Time  1349    PT Stop Time  1432    PT Time Calculation (min)  43 min    Equipment Utilized During Treatment  Gait belt    Activity Tolerance  Patient tolerated treatment well    Behavior During Therapy  [facility] for tasks assessed/performed       Past Medical History:  Diagnosis Date  . Arrhythmia   . H/O knee surgery    [facility] (replacement)  . Heart murmur   . History of kidney stones   . History of toe surgery    12/01/14 [facility]  . Hyperlipidemia   . Hypertension   . Thyroid disease     Past Surgical History:  Procedure Laterality Date  . arm surgery    . BACK SURGERY    . CHOLECYSTECTOMY    . TOE SURGERY    . TOTAL ABDOMINAL HYSTERECTOMY    . TOTAL KNEE ARTHROPLASTY Bilateral     There were no vitals filed for this visit.  Subjective Assessment - 12/18/17 1351    Subjective  Pt states driving makes her L shoulder hurt. 0/10 bilateral shoulder pain at rest. 3/10 L shoulder with abduction, 5/10 R shoulder pain with abduction.  0/10 L knee at rest sitting, 4/10 L knee pain/tightness with gait.  pt has a difficult time remembering    Pertinent History  Pt states falling onto her L knee around December 27/28, 2018. Pt states she has bad balance. Pt stepped off the curb and fell. Pt landed onto her hands and L knee.  Pt fractured her L patella during the fall. Pt also states that she also had a tear in her R rotator cuff prior to her fall. L shoulder  hurts but does not know if she hurt it during the fall. Raising her arms up bothers her. L knee still bothers her.   Pt was in a knee brace L LE for 2.5 months after the fall.  Pt states that her doctor thought it was healing around March 2019. Had home health PT until towards the end of March.  Going back to [facility] to see her knee surgeon at the last week of May, 2019.  Pt also states that she has memory difficulties due to cirrhosis.  L knee bothers her more than her shoulders.  L knee feels like it tingles or stings when something touches it.  Uses a SPC for longer distance walking since after the fall. [facility] [address] again after returning home from her doctor's visit in March.  Pt was gently pushing her great granddaughter on a swing. No other falls for the last 6 months.   Pt states having a fear of falling.   R shoulder bothers her the most compared to L. Pt is R hand dominant.     Patient Stated Goals  Pick up her grandchildren with less pain. Be able  to walk better out in the yard. Improve Balance    Currently in Pain?  Yes    Pain Score  4     Pain Onset  More than a month ago                               PT Education - 12/18/17 1542    Education provided  Yes    Education Details  ther-ex    [employer]) Educated  Patient    Methods  Explanation;Demonstration;Tactile cues;Verbal cues    Comprehension  Returned demonstration;Verbalized understanding         Objectives  -8 degrees seated L knee extension AROM   Manual therapy  Seated STM R vastus lateralis. Burning sensation around L4 dermatome towards patella which eases with rest.   Therapeutic exercise  Seated hip adduction small physioball squeeze with glute max squeeze 10x5 seconds for 4 sets  Bilateral hip discomfort which disappears with decreased hip extension position in sitting    L knee discomfort when walking   Seated ball rolls L knee flexion 10x3. Less discomfort during 3rd set.   Seated L hip  extension isometrics 10x5 seconds for 2 sets, feet not touching the floor  Seated manually resisted clamshell isometrics, hips less than 90 degrees flexion 10x5 seconds  Slight patellar crepitus afterwards which decreased with seated hip adduction ball squeeze with glute squeeze  Improved exercise technique, movement at target joints, use of target muscles after mod verbal, visual, tactile cues.   Worked on decreasing vastus lateralis muscle use, improving vastus medialis muscle activation, glute med muscle strengthening and knee flexion/extension movement. L anterior knee burning sensation at times (L4 dermatome) with STM to vastus lateralis which gradually eases with rest. Fair tolerance to session.      PT Long Term Goals - 12/10/17 1249      PT LONG TERM GOAL #1   Title  Patient will have a decrease in L knee pain to 4/10 at worst to promote ability to ambulate and perform transfers more comfortably.     Baseline  7/10 L knee pain at worst for the past 2 months (12/10/2017)    Time  8    Period  Weeks    Status  New    Target Date  02/06/18      PT LONG TERM GOAL #2   Title  Patient will improve L LE strength by at least 1/2 MMT to promote ability to perform standing tasks.     Time  8    Period  Weeks    Status  New    Target Date  02/06/18      PT LONG TERM GOAL #3   Title  Patient will have a decrease in R shoulder pain to 2/10 or less at worst, and L shoulder pain and 2/10 at worst to promote ability to raise her arms up.     Baseline  5/10 bilateral shoulder pain at worst (12/10/2017)    Time  8    Period  Weeks    Status  New    Target Date  02/06/18      PT LONG TERM GOAL #4   Title  Patient will improve bilateral shoulder ER strength to promote ability to raise her arms up more comfortably.     Time  8    Period  Weeks    Status  New    Target Date  02/06/18      PT LONG TERM GOAL #5   Title  Pt will be able to perform TUG in 11 seconds or less safely to promote  balance.     Baseline  9.67 seconds average without AD but unsteady, CGA to SBA (12/10/2017)    Time  8    Period  Weeks    Status  New    Target Date  02/06/18            Plan - 12/18/17 1543    Clinical Impression Statement  Worked on decreasing vastus lateralis muscle use, improving vastus medialis muscle activation, glute med muscle strengthening and knee flexion/extension movement. L anterior knee burning sensation at times (L4 dermatome) with STM to vastus lateralis which gradually eases with rest. Fair tolerance to session.     Rehab Potential  Fair    Clinical Impairments Affecting Rehab Potential  Decreased balance, multiple areas of pain (R and L shoulder, L knee), decreased balance, difficulty raising both arms, weakness, difficulty walking, difficulty getting into and out of bed, the car, difficulty negotiating stairs    PT Frequency  2x / week    PT Duration  8 weeks    PT Treatment/Interventions  Therapeutic activities;Therapeutic exercise;Neuromuscular re-education;Patient/family education;Manual techniques;Dry needling;Aquatic Therapy;Electrical Stimulation;Iontophoresis 30m/ml Dexamethasone;Gait training    PT Next Visit Plan  glute med and max strengthening, scapular and ER muscle strengthening, manual techniques, modalities PRN    Consulted and Agree with Plan of Care  Patient       Patient will benefit from skilled therapeutic intervention in order to improve the following deficits and impairments:  Pain, Postural dysfunction, Improper body mechanics, Decreased strength, Difficulty walking, Decreased range of motion, Decreased balance  Visit Diagnosis: Chronic pain of left knee  Muscle weakness (generalized)  History of falling     Problem List Patient Active Problem List   Diagnosis Date Noted  . Acute bilateral low back pain 06/18/2017  . Aortic heart murmur on examination 12/06/2015  . Urinary incontinence, urge 12/06/2015  . Gait instability  12/06/2015  . Shortness of breath 12/06/2015  . Memory loss 05/03/2015  . Allergic rhinitis 05/02/2015  . Blood glucose elevated 05/02/2015  . HLD (hyperlipidemia) 05/02/2015  . Essential hypertension 05/02/2015  . Adult hypothyroidism 05/02/2015  . Malaise and fatigue 05/02/2015  . Mild mitral regurgitation by prior echocardiogram 05/02/2015  . Obesity (BMI 35.0-39.9 without comorbidity) 05/02/2015  . Osteoarthritis, multiple sites 05/02/2015  . Anxiety 01/27/2015  . Liver cirrhosis secondary to [place]H [place]  . Hammer toe 11/18/2014  . Hallux rigidus of right foot 11/18/2014  . Hallux rigidus of left foot 11/18/2014  . Chronic right shoulder pain 10/26/2014  . Peripheral nerve disease 10/22/2012    MJoneen BoersPT, DPT   12/18/2017, 3:57 PM  [facility] [address] [place] [address]Phone: [phone]  Fax:  [phone] Name: Anita SCHEIDMRN: [id number] of Birth: 112-14-1942

## 2017-12-19 DIAGNOSIS — S82032A Displaced transverse fracture of left patella, initial encounter for closed fracture: Secondary | ICD-10-CM | POA: Diagnosis not present

## 2017-12-19 DIAGNOSIS — T8454XD Infection and inflammatory reaction due to internal left knee prosthesis, subsequent encounter: Secondary | ICD-10-CM | POA: Diagnosis not present

## 2017-12-19 DIAGNOSIS — Z96651 Presence of right artificial knee joint: Secondary | ICD-10-CM | POA: Diagnosis not present

## 2017-12-19 DIAGNOSIS — T8459XS Infection and inflammatory reaction due to other internal joint prosthesis, sequela: Secondary | ICD-10-CM | POA: Diagnosis not present

## 2017-12-19 DIAGNOSIS — Y33XXXA Other specified events, undetermined intent, initial encounter: Secondary | ICD-10-CM | POA: Diagnosis not present

## 2017-12-19 DIAGNOSIS — S82032D Displaced transverse fracture of left patella, subsequent encounter for closed fracture with routine healing: Secondary | ICD-10-CM | POA: Diagnosis not present

## 2017-12-19 DIAGNOSIS — M778 Other enthesopathies, not elsewhere classified: Secondary | ICD-10-CM | POA: Diagnosis not present

## 2017-12-19 DIAGNOSIS — M1612 Unilateral primary osteoarthritis, left hip: Secondary | ICD-10-CM | POA: Diagnosis not present

## 2017-12-19 DIAGNOSIS — S82092A Other fracture of left patella, initial encounter for closed fracture: Secondary | ICD-10-CM | POA: Diagnosis not present

## 2017-12-19 DIAGNOSIS — M810 Age-related osteoporosis without current pathological fracture: Secondary | ICD-10-CM | POA: Diagnosis not present

## 2017-12-19 DIAGNOSIS — Z96652 Presence of left artificial knee joint: Secondary | ICD-10-CM | POA: Insufficient documentation

## 2017-12-19 DIAGNOSIS — Z96659 Presence of unspecified artificial knee joint: Secondary | ICD-10-CM | POA: Diagnosis not present

## 2017-12-24 ENCOUNTER — Ambulatory Visit: Payer: Medicare Other | Attending: Gastroenterology

## 2017-12-24 DIAGNOSIS — Z9181 History of falling: Secondary | ICD-10-CM | POA: Insufficient documentation

## 2017-12-24 DIAGNOSIS — M25562 Pain in left knee: Secondary | ICD-10-CM | POA: Diagnosis not present

## 2017-12-24 DIAGNOSIS — M25511 Pain in right shoulder: Secondary | ICD-10-CM | POA: Diagnosis not present

## 2017-12-24 DIAGNOSIS — G8929 Other chronic pain: Secondary | ICD-10-CM | POA: Diagnosis not present

## 2017-12-24 DIAGNOSIS — R262 Difficulty in walking, not elsewhere classified: Secondary | ICD-10-CM | POA: Diagnosis not present

## 2017-12-24 DIAGNOSIS — M6281 Muscle weakness (generalized): Secondary | ICD-10-CM | POA: Diagnosis not present

## 2017-12-24 DIAGNOSIS — M25512 Pain in left shoulder: Secondary | ICD-10-CM | POA: Insufficient documentation

## 2017-12-24 NOTE — Patient Instructions (Signed)
     Scapular Retraction (perform in sitting)   With arms at sides, pinch shoulder blades together.  Hold for 5 seconds.  Repeat __10__ times per set. Do __3__ sets per session.     Copyright  VHI. All rights reserved.

## 2017-12-24 NOTE — Therapy (Signed)
[facility] [address] [place], [address] Phone: [phone]   Fax:  [phone]  Physical Therapy Treatment  Patient Details  Name: Anita Bennett MRN: [id number] Date of Birth: [birth year] Referring Provider: Cornelius Moras, NP   Encounter Date: 12/24/2017  PT End of Session - 12/24/17 0910    Visit Number  3    Number of Visits  17    Date for PT Re-Evaluation  02/06/18    Authorization Type  3    Authorization Time Period  of 10    PT Start Time  0910    PT Stop Time  1003    PT Time Calculation (min)  53 min    Equipment Utilized During Treatment  Gait belt    Activity Tolerance  Patient tolerated treatment well    Behavior During Therapy  [facility] for tasks assessed/performed       Past Medical History:  Diagnosis Date  . Arrhythmia   . H/O knee surgery    2011 [place] knee (replacement)  . Heart murmur   . History of kidney stones   . History of toe surgery    12/01/14 [facility]  . Hyperlipidemia   . Hypertension   . Thyroid disease     Past Surgical History:  Procedure Laterality Date  . arm surgery    . BACK SURGERY    . CHOLECYSTECTOMY    . TOE SURGERY    . TOTAL ABDOMINAL HYSTERECTOMY    . TOTAL KNEE ARTHROPLASTY Bilateral     There were no vitals filed for this visit.  Subjective Assessment - 12/24/17 0909    Subjective  Went to an orthopedic appointment and has to come back in a month. Unsure if her knee cap has healed. No specific instructions provided.  No L knee pain currently.  No bilateral shoulder pain currently.  No pain medications taken today. Shoulders bother her when she reaches quickly R > L.  L posterior neck bothers her because she hit her head during her fall in December 2018 which caused her problems     Pertinent History  Pt states falling onto her L knee around December 27/28, 2018. Pt states she has bad balance. Pt stepped off the curb and fell. Pt landed onto her hands  and L knee.  Pt fractured her L patella during the fall. Pt also states that she also had a tear in her R rotator cuff prior to her fall. L shoulder hurts but does not know if she hurt it during the fall. Raising her arms up bothers her. L knee still bothers her.   Pt was in a knee brace L LE for 2.5 months after the fall.  Pt states that her doctor thought it was healing around March 2019. Had home health PT until towards the end of March.  Going back to [facility] to see her knee surgeon at the last week of May, 2019.  Pt also states that she has memory difficulties due to cirrhosis.  L knee bothers her more than her shoulders.  L knee feels like it tingles or stings when something touches it.  Uses a SPC for longer distance walking since after the fall. [facility] again after returning home from her doctor's visit in March.  Pt was gently pushing her great granddaughter on a swing. No other falls for the last 6 months.   Pt states having a fear of falling.   R shoulder  bothers her the most compared to L. Pt is R hand dominant.     Patient Stated Goals  Pick up her grandchildren with less pain. Be able to walk better out in the yard. Improve Balance    Currently in Pain?  No/denies    Pain Score  0-No pain    Pain Onset  More than a month ago                               PT Education - 12/24/17 1001    Education provided  Yes    Education Details  ther-ex, HEP    Person(s) Educated  Patient    Methods  Explanation;Demonstration;Tactile cues;Verbal cues;Handout    Comprehension  Returned demonstration;Verbalized understanding         Objectives    Manual therapy  Seated STM to R rhomboid and upper trap muscles. Decreased neck and bilateral shoulder pain    Cervical rotation 50 degrees R, 40 degrees L (with L upper trap symptoms) afterwards  Seated STM to L upper trap, levator  muscle area    Cervical rotation: 65 degrees R, 55 degrees L with less pain. No bilateral  shoulder pain with shoulder flexion afterwards.       Therapeutic exercise  Pt was recommended to consistently use her SPC at home to put less stress to her L patella. Pt verbalized understanding.   Pt was also recommended to perform knee bending in a pain free range so as to not stress her patella. Pt verbalized understanding.   L shoulder flexion: anterior shoulder pain at end range R shoulder flexion: anterior shoulder pain; painful arc around 90 degrees flexion  Cervical rotation towards beginning of session  35 degrees R with L posterior cervical paraspinal pain > L rotation.   40 degrees L with L posterior cervical pain  Prior to manual therapy  Chin tucks 10x2, with PT assist to decrease L upper cervical side bend. Feels less discomfort with PT assist  Seated manually resisted scapular retraction targeting the lower trap muscles  Bilaterally 5x5 second holds for 2x   Improved exercise technique, movement at target joints, use of target muscles after min to mod verbal, visual, tactile cues.   Pt demonstrates difficulty turning her head bilaterally as well as pain with bilateral shoulder flexion at start of session. No pain at rest. Improved ability to turn her head both directions as well as no pain with bilateral shoulder flexion following manual therapy to decrease bilateral upper trap, R rhomboid and L levator muscle tension. Worked on lower trap strengthening to help decrease upper trap muscle overuse and provided bilateral scapular retraction home exercise to help maintain progress while at home.     PT Long Term Goals - 12/10/17 1249      PT LONG TERM GOAL #1   Title  Patient will have a decrease in L knee pain to 4/10 at worst to promote ability to ambulate and perform transfers more comfortably.     Baseline  7/10 L knee pain at worst for the past 2 months (12/10/2017)    Time  8    Period  Weeks    Status  New    Target Date  02/06/18      PT LONG TERM GOAL #2    Title  Patient will improve L LE strength by at least 1/2 MMT to promote ability to perform standing tasks.  Time  8    Period  Weeks    Status  New    Target Date  02/06/18      PT LONG TERM GOAL #3   Title  Patient will have a decrease in R shoulder pain to 2/10 or less at worst, and L shoulder pain and 2/10 at worst to promote ability to raise her arms up.     Baseline  5/10 bilateral shoulder pain at worst (12/10/2017)    Time  8    Period  Weeks    Status  New    Target Date  02/06/18      PT LONG TERM GOAL #4   Title  Patient will improve bilateral shoulder ER strength to promote ability to raise her arms up more comfortably.     Time  8    Period  Weeks    Status  New    Target Date  02/06/18      PT LONG TERM GOAL #5   Title  Pt will be able to perform TUG in 11 seconds or less safely to promote balance.     Baseline  9.67 seconds average without AD but unsteady, CGA to SBA (12/10/2017)    Time  8    Period  Weeks    Status  New    Target Date  02/06/18            Plan - 12/24/17 0909    Clinical Impression Statement  Pt demonstrates difficulty turning her head bilaterally as well as pain with bilateral shoulder flexion at start of session. No pain at rest. Improved ability to turn her head both directions as well as no pain with bilateral shoulder flexion following manual therapy to decrease bilateral upper trap, R rhomboid and L levator muscle tension. Worked on lower trap strengthening to help decrease upper trap muscle overuse and provided bilateral scapular retraction home exercise to help maintain progress while at home.     Rehab Potential  Fair    Clinical Impairments Affecting Rehab Potential  Decreased balance, multiple areas of pain (R and L shoulder, L knee), decreased balance, difficulty raising both arms, weakness, difficulty walking, difficulty getting into and out of bed, the car, difficulty negotiating stairs    PT Frequency  2x / week    PT  Duration  8 weeks    PT Treatment/Interventions  Therapeutic activities;Therapeutic exercise;Neuromuscular re-education;Patient/family education;Manual techniques;Dry needling;Aquatic Therapy;Electrical Stimulation;Iontophoresis 52m/ml Dexamethasone;Gait training    PT Next Visit Plan  glute med and max strengthening, scapular and ER muscle strengthening, manual techniques, modalities PRN    Consulted and Agree with Plan of Care  Patient       Patient will benefit from skilled therapeutic intervention in order to improve the following deficits and impairments:  Pain, Postural dysfunction, Improper body mechanics, Decreased strength, Difficulty walking, Decreased range of motion, Decreased balance  Visit Diagnosis: Chronic right shoulder pain  Muscle weakness (generalized)  Chronic left shoulder pain     Problem List Patient Active Problem List   Diagnosis Date Noted  . Acute bilateral low back pain 06/18/2017  . Aortic heart murmur on examination 12/06/2015  . Urinary incontinence, urge 12/06/2015  . Gait instability 12/06/2015  . Shortness of breath 12/06/2015  . Memory loss 05/03/2015  . Allergic rhinitis 05/02/2015  . Blood glucose elevated 05/02/2015  . HLD (hyperlipidemia) 05/02/2015  . Essential hypertension 05/02/2015  . Adult hypothyroidism 05/02/2015  . Malaise and fatigue 05/02/2015  . Mild  mitral regurgitation by prior echocardiogram 05/02/2015  . Obesity (BMI 35.0-39.9 without comorbidity) 05/02/2015  . Osteoarthritis, multiple sites 05/02/2015  . Anxiety 01/27/2015  . Liver cirrhosis secondary to NASH (nonalcoholic steatohepatitis) ([place]) 01/12/2015  . Hammer toe 11/18/2014  . Hallux rigidus of right foot 11/18/2014  . Hallux rigidus of left foot 11/18/2014  . Chronic right shoulder pain 10/26/2014  . Peripheral nerve disease 10/22/2012    Joneen Boers PT, DPT   12/24/2017, 10:16 AM  [facility] [address] [place], [address] Phone: [phone]   Fax:  [phone]  Name: Anita Bennett MRN: [id number] Date of Birth: 1941-06-04

## 2017-12-26 ENCOUNTER — Ambulatory Visit: Payer: Medicare Other

## 2017-12-26 DIAGNOSIS — M25512 Pain in left shoulder: Secondary | ICD-10-CM

## 2017-12-26 DIAGNOSIS — G8929 Other chronic pain: Secondary | ICD-10-CM

## 2017-12-26 DIAGNOSIS — M6281 Muscle weakness (generalized): Secondary | ICD-10-CM | POA: Diagnosis not present

## 2017-12-26 DIAGNOSIS — M25511 Pain in right shoulder: Secondary | ICD-10-CM | POA: Diagnosis not present

## 2017-12-26 DIAGNOSIS — M25562 Pain in left knee: Secondary | ICD-10-CM | POA: Diagnosis not present

## 2017-12-26 DIAGNOSIS — R262 Difficulty in walking, not elsewhere classified: Secondary | ICD-10-CM | POA: Diagnosis not present

## 2017-12-26 NOTE — Patient Instructions (Signed)
Resisted External Rotation: in Neutral - Bilateral     Yellow band  Sit or stand, tubing in both hands, elbows at sides, bent to 90, forearms forward. Pinch shoulder blades together and rotate forearms out. Keep elbows at sides. Repeat _10___ times per set. Do _1___ sets per session. Do __1_ sessions per day.  http://orth.exer.us/966   Copyright  VHI. All rights reserved.

## 2017-12-26 NOTE — Therapy (Signed)
[facility] [address] [place], [address] Phone: [phone]   Fax:  [phone]  Physical Therapy Treatment  Patient Details  Name: Anita Bennett MRN: [id number] Date of Birth: [birth year] Referring Provider: Cornelius Moras, NP   Encounter Date: 12/26/2017  PT End of Session - 12/26/17 1303    Visit Number  4    Number of Visits  17    Date for PT Re-Evaluation  02/06/18    Authorization Type  4    Authorization Time Period  of 10    PT Start Time  1303    PT Stop Time  1353    PT Time Calculation (min)  50 min    Equipment Utilized During Treatment  Gait belt    Activity Tolerance  Patient tolerated treatment well    Behavior During Therapy  [facility] for tasks assessed/performed       Past Medical History:  Diagnosis Date  . Arrhythmia   . H/O knee surgery    2011 [facility] knee (replacement)  . Heart murmur   . History of kidney stones   . History of toe surgery    12/01/14 [facility]  . Hyperlipidemia   . Hypertension   . Thyroid disease     Past Surgical History:  Procedure Laterality Date  . arm surgery    . BACK SURGERY    . CHOLECYSTECTOMY    . TOE SURGERY    . TOTAL ABDOMINAL HYSTERECTOMY    . TOTAL KNEE ARTHROPLASTY Bilateral     There were no vitals filed for this visit.  Subjective Assessment - 12/26/17 1304    Subjective  L knee feels ok. Her L thigh feels more and more numb every day from her L hip to knee. Worse when she stands and walks. She tries to be careful.  Neck and shoulders feel better except the R shoulder. Raising her R arm up bothers her.  7/10 R shoulder, 2/10 L shoulder pain when raising it up.   R anterior thigh numbness started about 2-3 weeks ago.      Pertinent History  Pt states falling onto her L knee around December 27/28, 2018. Pt states she has bad balance. Pt stepped off the curb and fell. Pt landed onto her hands and L knee.  Pt fractured her L patella during the  fall. Pt also states that she also had a tear in her R rotator cuff prior to her fall. L shoulder hurts but does not know if she hurt it during the fall. Raising her arms up bothers her. L knee still bothers her.   Pt was in a knee brace L LE for 2.5 months after the fall.  Pt states that her doctor thought it was healing around March 2019. Had home health PT until towards the end of March.  Going back to [facility] to see her knee surgeon at the last week of May, 2019.  Pt also states that she has memory difficulties due to cirrhosis.  L knee bothers her more than her shoulders.  L knee feels like it tingles or stings when something touches it.  Uses a SPC for longer distance walking since after the fall. [facility] again after returning home from her doctor's visit in March.  Pt was gently pushing her great granddaughter on a swing. No other falls for the last 6 months.   Pt states having a fear of falling.   R shoulder bothers  her the most compared to L. Pt is R hand dominant.     Patient Stated Goals  Pick up her grandchildren with less pain. Be able to walk better out in the yard. Improve Balance    Currently in Pain?  Yes    Pain Score  7  R shoulder pain when raising it up.     Pain Onset  More than a month ago                               PT Education - 12/26/17 1329    Education provided  Yes    Education Details  ther-ex, HEP    Person(s) Educated  Patient    Methods  Explanation;Demonstration;Tactile cues;Verbal cues;Handout    Comprehension  Returned demonstration;Verbalized understanding         Objectives  No L thigh numbness when sitting. Increased L thigh numbness in standing.   Therapeutic exercise  Standing glute max squeeze 10x5 seconds for 2 sets  Seated bilateral shoulder extension isometrics, hands on thighs 10x3 with 5 second holds to help decrease lumbar paraspinal muscle tension   Standing bilateral scapular retraction resisting yellow band  10x3 Standing chin tucks 10x, then 6x to promote thoracic extension and decrease low back pressure  No L thigh numbness in standing when performing previous 2 exercises.   R shoulder ER isometrics with PT resistance 10x2 with 5 seconds   Decreased R shoulder pain with flexion afterwards  Seated bilateral shoulder ER resisting yellow band 10x, then 5x.   Reviewed and given as part of her HEP. Pt demonstrated and verbalized understanding.    Improved exercise technique, movement at target joints, use of target muscles after min to mod verbal, visual, tactile cues.      Manual therapy   Seated STM to lumbar paraspinal muscles.   No L anterior thigh numbness    Decreased R shoulder pain with flexion following exercises to promote ER muscle strengthening. Decreased L anterior thigh symptoms with treatment to promote thoracic extension to help decrease low back extension pressure.       PT Long Term Goals - 12/10/17 1249      PT LONG TERM GOAL #1   Title  Patient will have a decrease in L knee pain to 4/10 at worst to promote ability to ambulate and perform transfers more comfortably.     Baseline  7/10 L knee pain at worst for the past 2 months (12/10/2017)    Time  8    Period  Weeks    Status  New    Target Date  02/06/18      PT LONG TERM GOAL #2   Title  Patient will improve L LE strength by at least 1/2 MMT to promote ability to perform standing tasks.     Time  8    Period  Weeks    Status  New    Target Date  02/06/18      PT LONG TERM GOAL #3   Title  Patient will have a decrease in R shoulder pain to 2/10 or less at worst, and L shoulder pain and 2/10 at worst to promote ability to raise her arms up.     Baseline  5/10 bilateral shoulder pain at worst (12/10/2017)    Time  8    Period  Weeks    Status  New    Target Date  02/06/18  PT LONG TERM GOAL #4   Title  Patient will improve bilateral shoulder ER strength to promote ability to raise her arms up  more comfortably.     Time  8    Period  Weeks    Status  New    Target Date  02/06/18      PT LONG TERM GOAL #5   Title  Pt will be able to perform TUG in 11 seconds or less safely to promote balance.     Baseline  9.67 seconds average without AD but unsteady, CGA to SBA (12/10/2017)    Time  8    Period  Weeks    Status  New    Target Date  02/06/18            Plan - 12/26/17 1302    Clinical Impression Statement  Decreased R shoulder pain with flexion following exercises to promote ER muscle strengthening. Decreased L anterior thigh symptoms with treatment to promtoe thoracic extension to help decrease low back extension pressure.     Rehab Potential  Fair    Clinical Impairments Affecting Rehab Potential  Decreased balance, multiple areas of pain (R and L shoulder, L knee), decreased balance, difficulty raising both arms, weakness, difficulty walking, difficulty getting into and out of bed, the car, difficulty negotiating stairs    PT Frequency  2x / week    PT Duration  8 weeks    PT Treatment/Interventions  Therapeutic activities;Therapeutic exercise;Neuromuscular re-education;Patient/family education;Manual techniques;Dry needling;Aquatic Therapy;Electrical Stimulation;Iontophoresis 66m/ml Dexamethasone;Gait training    PT Next Visit Plan  glute med and max strengthening, scapular and ER muscle strengthening, manual techniques, modalities PRN    Consulted and Agree with Plan of Care  Patient       Patient will benefit from skilled therapeutic intervention in order to improve the following deficits and impairments:  Pain, Postural dysfunction, Improper body mechanics, Decreased strength, Difficulty walking, Decreased range of motion, Decreased balance  Visit Diagnosis: Muscle weakness (generalized)  Chronic right shoulder pain  Chronic left shoulder pain     Problem List Patient Active Problem List   Diagnosis Date Noted  . Acute bilateral low back pain 06/18/2017   . Aortic heart murmur on examination 12/06/2015  . Urinary incontinence, urge 12/06/2015  . Gait instability 12/06/2015  . Shortness of breath 12/06/2015  . Memory loss 05/03/2015  . Allergic rhinitis 05/02/2015  . Blood glucose elevated 05/02/2015  . HLD (hyperlipidemia) 05/02/2015  . Essential hypertension 05/02/2015  . Adult hypothyroidism 05/02/2015  . Malaise and fatigue 05/02/2015  . Mild mitral regurgitation by prior echocardiogram 05/02/2015  . Obesity (BMI 35.0-39.9 without comorbidity) 05/02/2015  . Osteoarthritis, multiple sites 05/02/2015  . Anxiety 01/27/2015  . Liver cirrhosis secondary to NASH (nonalcoholic steatohepatitis) [place] 01/12/2015  . Hammer toe 11/18/2014  . Hallux rigidus of right foot 11/18/2014  . Hallux rigidus of left foot 11/18/2014  . Chronic right shoulder pain 10/26/2014  . Peripheral nerve disease 10/22/2012    MJoneen BoersPT, DPT   12/26/2017, 3:41 PM  [facility] [address] [place] [id number]: [phone]  Fax:  [phone] Name: MTILDA SAMUDIOMRN: [id number] of Birth: [birth year]

## 2017-12-31 ENCOUNTER — Ambulatory Visit: Payer: Medicare Other

## 2018-01-01 ENCOUNTER — Ambulatory Visit: Payer: Medicare Other

## 2018-01-01 DIAGNOSIS — M25511 Pain in right shoulder: Secondary | ICD-10-CM | POA: Diagnosis not present

## 2018-01-01 DIAGNOSIS — G8929 Other chronic pain: Secondary | ICD-10-CM | POA: Diagnosis not present

## 2018-01-01 DIAGNOSIS — R262 Difficulty in walking, not elsewhere classified: Secondary | ICD-10-CM

## 2018-01-01 DIAGNOSIS — M25512 Pain in left shoulder: Secondary | ICD-10-CM | POA: Diagnosis not present

## 2018-01-01 DIAGNOSIS — M6281 Muscle weakness (generalized): Secondary | ICD-10-CM | POA: Diagnosis not present

## 2018-01-01 DIAGNOSIS — M25562 Pain in left knee: Secondary | ICD-10-CM

## 2018-01-01 NOTE — Therapy (Signed)
[facility] [address] [place], [address] Phone: [phone]   Fax:  [phone]  Physical Therapy Treatment  Patient Details  Name: Anita Bennett MRN: [id number] Date of Birth: 01-06-1941 Referring Provider: Cornelius Moras, NP   Encounter Date: 01/01/2018  PT End of Session - 01/01/18 1120    Visit Number  5    Number of Visits  17    Date for PT Re-Evaluation  02/06/18    Authorization Type  5    Authorization Time Period  of 10    PT Start Time  1120    PT Stop Time  1203    PT Time Calculation (min)  43 min    Equipment Utilized During Treatment  Gait belt    Activity Tolerance  Patient tolerated treatment well    Behavior During Therapy  WFL for tasks assessed/performed       Past Medical History:  Diagnosis Date  . Arrhythmia   . H/O knee surgery    2011 [facility] knee (replacement)  . Heart murmur   . History of kidney stones   . History of toe surgery    12/01/14 [facility]  . Hyperlipidemia   . Hypertension   . Thyroid disease     Past Surgical History:  Procedure Laterality Date  . arm surgery    . BACK SURGERY    . CHOLECYSTECTOMY    . TOE SURGERY    . TOTAL ABDOMINAL HYSTERECTOMY    . TOTAL KNEE ARTHROPLASTY Bilateral     There were no vitals filed for this visit.  Subjective Assessment - 01/01/18 1122    Subjective  L knee is bothering her today. 2/10 L knee currently. Bothered her a lot yesterday (6/10 which comes and goes, sharp pain). The numbness in front of her L thigh is no better.  Neck feels better.  5/0 R shoulder pain when raising it up. No L shoulder pain.  L knee is bothering her more today.     Pertinent History  Pt states falling onto her L knee around December 27/28, 2018. Pt states she has bad balance. Pt stepped off the curb and fell. Pt landed onto her hands and L knee.  Pt fractured her L patella during the fall. Pt also states that she also had a tear in her R  rotator cuff prior to her fall. L shoulder hurts but does not know if she hurt it during the fall. Raising her arms up bothers her. L knee still bothers her.   Pt was in a knee brace L LE for 2.5 months after the fall.  Pt states that her doctor thought it was healing around March 2019. Had home health PT until towards the end of March.  Going back to [facility] to see her knee surgeon at the last week of May, 2019.  Pt also states that she has memory difficulties due to cirrhosis.  L knee bothers her more than her shoulders.  L knee feels like it tingles or stings when something touches it.  Uses a SPC for longer distance walking since after the fall. [facility] again after returning home from her doctor's visit in March.  Pt was gently pushing her great granddaughter on a swing. No other falls for the last 6 months.   Pt states having a fear of falling.   R shoulder bothers her the most compared to L. Pt is R hand dominant.  Patient Stated Goals  Pick up her grandchildren with less pain. Be able to walk better out in the yard. Improve Balance    Currently in Pain?  Yes    Pain Score  2  L knee pain    Pain Onset  More than a month ago                               PT Education - 01/01/18 1214    Education provided  Yes    Education Details  ther-ex, use of rw for safety if needed    Person(s) Educated  Patient    Methods  Explanation;Demonstration;Tactile cues    Comprehension  Returned demonstration;Verbalized understanding         Objectives  No L thigh numbness when sitting. Increased L thigh numbness in standing.   [place] E-stim L knee  channel 2 medial and lateral knee for pain control 150 V x 15 minutes  Try russian to VMO next visit if appropriate   Manual therapy  Seated STM to R upper trap and R teres major muscle during E-stim Decreased anterior shoulder pain with shoulder flexion   Therapeutic exercise   Seated manually resisted scapular retraction  targeting the lower trap  R 10x5 seconds for 3 sets  R shoulder flexion with scapular retraction 3x, then 10x with elbow flexed as well  R anterior shoulder pain. No R anterior shoulder pain following exercise to promote ER muscle activation  Seated R shoulder ER isometrics 10x5 second holds for 2 sets  Improved exercise technique, movement at target joints, use of target muscles after mod verbal, visual, tactile cues.    Decreased anterior R shoulder pain following treatment to decrease R upper trap and teres major muscle tension, improving R infraspinatus and scapular muscle activation to promote better glenohumeral control with shoulder flexion. Utilized high volt e-stim to L knee medial and lateral to help decrease patellar pain. Decreased L knee pain with walking based on pt conversation at end of session following e-stim. Pt was recommended to use rw for safety when walking if pt feels L thigh is weak due to the patellar fracture. Unsure if it has healed yet. Pt verbalized understanding.       PT Long Term Goals - 12/10/17 1249      PT LONG TERM GOAL #1   Title  Patient will have a decrease in L knee pain to 4/10 at worst to promote ability to ambulate and perform transfers more comfortably.     Baseline  7/10 L knee pain at worst for the past 2 months (12/10/2017)    Time  8    Period  Weeks    Status  New    Target Date  02/06/18      PT LONG TERM GOAL #2   Title  Patient will improve L LE strength by at least 1/2 MMT to promote ability to perform standing tasks.     Time  8    Period  Weeks    Status  New    Target Date  02/06/18      PT LONG TERM GOAL #3   Title  Patient will have a decrease in R shoulder pain to 2/10 or less at worst, and L shoulder pain and 2/10 at worst to promote ability to raise her arms up.     Baseline  5/10 bilateral shoulder pain at worst (12/10/2017)  Time  8    Period  Weeks    Status  New    Target Date  02/06/18      PT LONG TERM  GOAL #4   Title  Patient will improve bilateral shoulder ER strength to promote ability to raise her arms up more comfortably.     Time  8    Period  Weeks    Status  New    Target Date  02/06/18      PT LONG TERM GOAL #5   Title  Pt will be able to perform TUG in 11 seconds or less safely to promote balance.     Baseline  9.67 seconds average without AD but unsteady, CGA to SBA (12/10/2017)    Time  8    Period  Weeks    Status  New    Target Date  02/06/18            Plan - 01/01/18 1212    Clinical Impression Statement  Decreased anterior R shoulder pain following treatment to decrease R upper trap and teres major muscle tension, improving R infraspinatus and scapular muscle activation to promote better glenohumeral control with shoulder flexion. Utilized high volt e-stim to L knee medial and lateral to help decrease patellar pain. Decreased L knee pain with walking based on pt conversation at end of session following e-stim. Pt was recommended to use rw for safety when walking if pt feels L thigh is weak due to the patellar fracture. Unsure if it has healed yet. Pt verbalized understanding.     Rehab Potential  Fair    Clinical Impairments Affecting Rehab Potential  Decreased balance, multiple areas of pain (R and L shoulder, L knee), decreased balance, difficulty raising both arms, weakness, difficulty walking, difficulty getting into and out of bed, the car, difficulty negotiating stairs    PT Frequency  2x / week    PT Duration  8 weeks    PT Treatment/Interventions  Therapeutic activities;Therapeutic exercise;Neuromuscular re-education;Patient/family education;Manual techniques;Dry needling;Aquatic Therapy;Electrical Stimulation;Iontophoresis 54m/ml Dexamethasone;Gait training    PT Next Visit Plan  glute med and max strengthening, scapular and ER muscle strengthening, manual techniques, modalities PRN    Consulted and Agree with Plan of Care  Patient       Patient will  benefit from skilled therapeutic intervention in order to improve the following deficits and impairments:  Pain, Postural dysfunction, Improper body mechanics, Decreased strength, Difficulty walking, Decreased range of motion, Decreased balance  Visit Diagnosis: Muscle weakness (generalized)  Chronic right shoulder pain  Chronic pain of left knee  Difficulty in walking, not elsewhere classified     Problem List Patient Active Problem List   Diagnosis Date Noted  . Acute bilateral low back pain 06/18/2017  . Aortic heart murmur on examination 12/06/2015  . Urinary incontinence, urge 12/06/2015  . Gait instability 12/06/2015  . Shortness of breath 12/06/2015  . Memory loss 05/03/2015  . Allergic rhinitis 05/02/2015  . Blood glucose elevated 05/02/2015  . HLD (hyperlipidemia) 05/02/2015  . Essential hypertension 05/02/2015  . Adult hypothyroidism 05/02/2015  . Malaise and fatigue 05/02/2015  . Mild mitral regurgitation by prior echocardiogram 05/02/2015  . Obesity (BMI 35.0-39.9 without comorbidity) 05/02/2015  . Osteoarthritis, multiple sites 05/02/2015  . Anxiety 01/27/2015  . Liver cirrhosis secondary to NASH (nonalcoholic steatohepatitis) ([facility] 01/12/2015  . Hammer toe 11/18/2014  . Hallux rigidus of right foot 11/18/2014  . Hallux rigidus of left foot 11/18/2014  . Chronic right shoulder  pain 10/26/2014  . Peripheral nerve disease 10/22/2012    Joneen Boers PT, DPT   01/01/2018, 8:45 PM  [facility] [address], [place], [address] Phone: [phone]   Fax:  [phone]  Name: Anita Bennett MRN: [id number] Date of Birth: 01/11/1941

## 2018-01-07 ENCOUNTER — Ambulatory Visit: Payer: Medicare Other

## 2018-01-07 DIAGNOSIS — M25511 Pain in right shoulder: Secondary | ICD-10-CM | POA: Diagnosis not present

## 2018-01-07 DIAGNOSIS — M25512 Pain in left shoulder: Secondary | ICD-10-CM | POA: Diagnosis not present

## 2018-01-07 DIAGNOSIS — R262 Difficulty in walking, not elsewhere classified: Secondary | ICD-10-CM

## 2018-01-07 DIAGNOSIS — M25562 Pain in left knee: Secondary | ICD-10-CM

## 2018-01-07 DIAGNOSIS — G8929 Other chronic pain: Secondary | ICD-10-CM | POA: Diagnosis not present

## 2018-01-07 DIAGNOSIS — M6281 Muscle weakness (generalized): Secondary | ICD-10-CM

## 2018-01-07 DIAGNOSIS — Z9181 History of falling: Secondary | ICD-10-CM

## 2018-01-07 NOTE — Therapy (Signed)
[facility] [address] [place], [address] Phone: [phone]   Fax:  [phone]  Physical Therapy Treatment  Patient Details  Name: Anita Bennett MRN: [id number] Date of Birth: 10/20/1940 Referring Provider: Cornelius Moras, NP   Encounter Date: 01/07/2018  PT End of Session - 01/07/18 0904    Visit Number  6    Number of Visits  17    Date for PT Re-Evaluation  02/06/18    Authorization Type  6    Authorization Time Period  of 10    PT Start Time  0905    PT Stop Time  0942    PT Time Calculation (min)  37 min    Equipment Utilized During Treatment  Gait belt    Activity Tolerance  Patient tolerated treatment well    Behavior During Therapy  [facility] for tasks assessed/performed       Past Medical History:  Diagnosis Date  . Arrhythmia   . H/O knee surgery    2011 [facility] knee (replacement)  . Heart murmur   . History of kidney stones   . History of toe surgery    12/01/14 [facility]  . Hyperlipidemia   . Hypertension   . Thyroid disease     Past Surgical History:  Procedure Laterality Date  . arm surgery    . BACK SURGERY    . CHOLECYSTECTOMY    . TOE SURGERY    . TOTAL ABDOMINAL HYSTERECTOMY    . TOTAL KNEE ARTHROPLASTY Bilateral     There were no vitals filed for this visit.  Subjective Assessment - 01/07/18 0910    Subjective  Has a hemorrhage R eye which started this weekend. Her eye doctors said that it will go away in 2 weeks. Pt states the E-stim last session helped.  3/10 L knee when walking. No bilateral shoulder pain when raising it up but has aches sometimes.  5/10 R shoulder pain when reaching across.  L knee gives way at times when walking.     Pertinent History  Pt states falling onto her L knee around December 27/28, 2018. Pt states she has bad balance. Pt stepped off the curb and fell. Pt landed onto her hands and L knee.  Pt fractured her L patella during the fall. Pt also states  that she also had a tear in her R rotator cuff prior to her fall. L shoulder hurts but does not know if she hurt it during the fall. Raising her arms up bothers her. L knee still bothers her.   Pt was in a knee brace L LE for 2.5 months after the fall.  Pt states that her doctor thought it was healing around March 2019. Had home health PT until towards the end of March.  Going back to [facility] to see her knee surgeon at the last week of May, 2019.  Pt also states that she has memory difficulties due to cirrhosis.  L knee bothers her more than her shoulders.  L knee feels like it tingles or stings when something touches it.  Uses a SPC for longer distance walking since after the fall. [facility] again after returning home from her doctor's visit in March.  Pt was gently pushing her great granddaughter on a swing. No other falls for the last 6 months.   Pt states having a fear of falling.   R shoulder bothers her the most compared to L. Pt is R  hand dominant.     Patient Stated Goals  Pick up her grandchildren with less pain. Be able to walk better out in the yard. Improve Balance    Currently in Pain?  Yes    Pain Score  5  R shoulder pain when reaching across.     Pain Onset  More than a month ago                               PT Education - 01/07/18 0951    Education provided  Yes    Education Details  Take medications prior to coming to PT     Person(s) Educated  Patient    Methods  Explanation    Comprehension  Verbalized understanding         Objectives  [place] E-stim L knee             Channel 1: L VMO muscle, 15 min with 10 seconds on/10 seconds off for muscle activation and pain control.  21 mA   Manual therapy   Seated STM to R upper trap and R teres major muscle during E-stim  Seated STM L upper trap muscle during E-stim.    Therapeutic exercise   Blood pressure L arm sitting, mechanically taken:  184/64, HR 67  Pt was encouraged to continue using  SPC (a rw if she feels like she needs more support) when walking for safety.   Blood pressure L arm sitting at end of session, mechanically taken: 173/63, HR 63  Try standing L hip abduction and extension next visit if appropriate   Utilized [place] e-stim setting predominantly to promote muscle activation while protecting the healing patellar fracture. Did not perform much exercise today secondary to elevated blood pressure level (pt did not take her medication this morning) and R eye hemorrhage. Able to perform bilateral shoulder flexion AROM without complain of pain today. Pain only elicited when pt tries to reach across with her R arm.           PT Long Term Goals - 12/10/17 1249      PT LONG TERM GOAL #1   Title  Patient will have a decrease in L knee pain to 4/10 at worst to promote ability to ambulate and perform transfers more comfortably.     Baseline  7/10 L knee pain at worst for the past 2 months (12/10/2017)    Time  8    Period  Weeks    Status  New    Target Date  02/06/18      PT LONG TERM GOAL #2   Title  Patient will improve L LE strength by at least 1/2 MMT to promote ability to perform standing tasks.     Time  8    Period  Weeks    Status  New    Target Date  02/06/18      PT LONG TERM GOAL #3   Title  Patient will have a decrease in R shoulder pain to 2/10 or less at worst, and L shoulder pain and 2/10 at worst to promote ability to raise her arms up.     Baseline  5/10 bilateral shoulder pain at worst (12/10/2017)    Time  8    Period  Weeks    Status  New    Target Date  02/06/18      PT LONG TERM GOAL #4   Title  Patient will improve bilateral shoulder ER strength to promote ability to raise her arms up more comfortably.     Time  8    Period  Weeks    Status  New    Target Date  02/06/18      PT LONG TERM GOAL #5   Title  Pt will be able to perform TUG in 11 seconds or less safely to promote balance.     Baseline  9.67 seconds average  without AD but unsteady, CGA to SBA (12/10/2017)    Time  8    Period  Weeks    Status  New    Target Date  02/06/18            Plan - 01/07/18 0946    Clinical Impression Statement  Utilized [place] e-stim setting predominantly to promote muscle activation while protecting the healing patellar fracture. Did not perform much exercise today secondary to elevated blood pressure level (pt did not take her medication this morning) and R eye hemorrhage. Able to perform bilateral shoulder flexion AROM without complain of pain today. Pain only elicited when pt tries to reach across with her R arm.     Rehab Potential  Fair    Clinical Impairments Affecting Rehab Potential  Decreased balance, multiple areas of pain (R and L shoulder, L knee), decreased balance, difficulty raising both arms, weakness, difficulty walking, difficulty getting into and out of bed, the car, difficulty negotiating stairs    PT Frequency  2x / week    PT Duration  8 weeks    PT Treatment/Interventions  Therapeutic activities;Therapeutic exercise;Neuromuscular re-education;Patient/family education;Manual techniques;Dry needling;Aquatic Therapy;Electrical Stimulation;Iontophoresis 52m/ml Dexamethasone;Gait training    PT Next Visit Plan  glute med and max strengthening, scapular and ER muscle strengthening, manual techniques, modalities PRN    Consulted and Agree with Plan of Care  Patient       Patient will benefit from skilled therapeutic intervention in order to improve the following deficits and impairments:  Pain, Postural dysfunction, Improper body mechanics, Decreased strength, Difficulty walking, Decreased range of motion, Decreased balance  Visit Diagnosis: Muscle weakness (generalized)  Chronic right shoulder pain  Chronic pain of left knee  Difficulty in walking, not elsewhere classified  Chronic left shoulder pain  History of falling     Problem List Patient Active Problem List   Diagnosis Date  Noted  . Acute bilateral low back pain 06/18/2017  . Aortic heart murmur on examination 12/06/2015  . Urinary incontinence, urge 12/06/2015  . Gait instability 12/06/2015  . Shortness of breath 12/06/2015  . Memory loss 05/03/2015  . Allergic rhinitis 05/02/2015  . Blood glucose elevated 05/02/2015  . HLD (hyperlipidemia) 05/02/2015  . Essential hypertension 05/02/2015  . Adult hypothyroidism 05/02/2015  . Malaise and fatigue 05/02/2015  . Mild mitral regurgitation by prior echocardiogram 05/02/2015  . Obesity (BMI 35.0-39.9 without comorbidity) 05/02/2015  . Osteoarthritis, multiple sites 05/02/2015  . Anxiety 01/27/2015  . Liver cirrhosis secondary to NASH (nonalcoholic steatohepatitis) ([facility] 01/12/2015  . Hammer toe 11/18/2014  . Hallux rigidus of right foot 11/18/2014  . Hallux rigidus of left foot 11/18/2014  . Chronic right shoulder pain 10/26/2014  . Peripheral nerve disease 10/22/2012    MJoneen BoersPT, DPT   01/07/2018, 9:56 AM  [facility] [address] [place] [address]Phone: [phone]  Fax:  [phone] Name: Anita LINDSLEYMRN: [id number] of Birth: 1Feb 29, 1942

## 2018-01-09 ENCOUNTER — Ambulatory Visit: Payer: Medicare Other

## 2018-01-09 DIAGNOSIS — M25511 Pain in right shoulder: Secondary | ICD-10-CM

## 2018-01-09 DIAGNOSIS — M25512 Pain in left shoulder: Secondary | ICD-10-CM | POA: Diagnosis not present

## 2018-01-09 DIAGNOSIS — M25562 Pain in left knee: Secondary | ICD-10-CM | POA: Diagnosis not present

## 2018-01-09 DIAGNOSIS — M6281 Muscle weakness (generalized): Secondary | ICD-10-CM | POA: Diagnosis not present

## 2018-01-09 DIAGNOSIS — R262 Difficulty in walking, not elsewhere classified: Secondary | ICD-10-CM | POA: Diagnosis not present

## 2018-01-09 DIAGNOSIS — G8929 Other chronic pain: Secondary | ICD-10-CM

## 2018-01-09 NOTE — Therapy (Addendum)
[facility] [address], [place], [address] Phone: [phone]   Fax:  [phone]  Physical Therapy Treatment  Patient Details  Name: Anita Bennett MRN: [id number] Date of Birth: 11/05/1940 Referring Provider: Cornelius Moras, NP   Encounter Date: 01/09/2018  PT End of Session - 01/09/18 1438    Visit Number  7    Number of Visits  17    Date for PT Re-Evaluation  02/06/18    Authorization Type  7    Authorization Time Period  of 10    PT Start Time  1437    PT Stop Time  1518    PT Time Calculation (min)  41 min    Equipment Utilized During Treatment  Gait belt    Activity Tolerance  Patient tolerated treatment well    Behavior During Therapy  [facility] for tasks assessed/performed       Past Medical History:  Diagnosis Date  . Arrhythmia   . H/O knee surgery    2011 [facility] (replacement)  . Heart murmur   . History of kidney stones   . History of toe surgery    12/01/14 [facility]  . Hyperlipidemia   . Hypertension   . Thyroid disease     Past Surgical History:  Procedure Laterality Date  . arm surgery    . BACK SURGERY    . CHOLECYSTECTOMY    . TOE SURGERY    . TOTAL ABDOMINAL HYSTERECTOMY    . TOTAL KNEE ARTHROPLASTY Bilateral     There were no vitals filed for this visit.  Subjective Assessment - 01/09/18 1441    Subjective  Pt states the E-stim setting 2 sessions ago helped better than the last session's setting. R shoulder currently bothering her a lot. Wants to check BP.   No L knee pain when walking earlier.  2/10 R shoulder pain when reaching across to drive with R hand.     Pertinent History  Pt states falling onto her L knee around December 27/28, 2018. Pt states she has bad balance. Pt stepped off the curb and fell. Pt landed onto her hands and L knee.  Pt fractured her L patella during the fall. Pt also states that she also had a tear in her R rotator cuff prior to her fall. L shoulder  hurts but does not know if she hurt it during the fall. Raising her arms up bothers her. L knee still bothers her.   Pt was in a knee brace L LE for 2.5 months after the fall.  Pt states that her doctor thought it was healing around March 2019. Had home health PT until towards the end of March.  Going back to [facility] to see her knee surgeon at the last week of May, 2019.  Pt also states that she has memory difficulties due to cirrhosis.  L knee bothers her more than her shoulders.  L knee feels like it tingles or stings when something touches it.  Uses a SPC for longer distance walking since after the fall. [facility] again after returning home from her doctor's visit in March.  Pt was gently pushing her great granddaughter on a swing. No other falls for the last 6 months.   Pt states having a fear of falling.   R shoulder bothers her the most compared to L. Pt is R hand dominant.     Patient Stated Goals  Pick up her grandchildren with  less pain. Be able to walk better out in the yard. Improve Balance    Currently in Pain?  Yes    Pain Score  2  R shoulder pain    Pain Onset  More than a month ago                               PT Education - 01/09/18 1911    Education provided  Yes    Education Details  keep shoulder blade back when raising R arm up, as well as heating pad to arm pit area (to decrease teres major and infraspinatus muscle tension to promote ability to reach across with less pain)    Person(s) Educated  Patient    Methods  Explanation;Demonstration;Tactile cues;Verbal cues    Comprehension  Verbalized understanding;Returned demonstration         Objectives  [place] E-stim L knee Channel 1: L VMO muscle, 5 min for muscle activation and pain control.  22 mA   [place] E-stim L knee             channel 2 medial and lateral knee for pain control 150-175 V x 5 minutes               About 5 min of E-stim secondary to power going out.     Manual  therapy   Part of manual therapy performed during estim  Seated STM to R infraspinatus muscle  Seated STM to R teres major muscle  No pain with R shoulder adduction (reaching across) afterwards     Seated STM to R upper trap muscle  Seated STM to R pectoralis major muscle   Seated STM L vastus lateralis to help decrease stress to patella     Blood pressure L arm sitting, mechanically taken:  183/70, HR 64 at start of session.    Decreased R shoulder pain with adduction following manual therapy to decrease tension to teres major and infraspinatus muscles and decreased pain with shoulder flexion with addition of manual therapy to decrease R upper trap and pectoralis muscle tension as well as scapular retraction during shoulder flexion. Exercises not performed secondary to pt still having a hemorrhage in her R eye as well as due to elevated blood pressure.        PT Long Term Goals - 12/10/17 1249      PT LONG TERM GOAL #1   Title  Patient will have a decrease in L knee pain to 4/10 at worst to promote ability to ambulate and perform transfers more comfortably.     Baseline  7/10 L knee pain at worst for the past 2 months (12/10/2017)    Time  8    Period  Weeks    Status  New    Target Date  02/06/18      PT LONG TERM GOAL #2   Title  Patient will improve L LE strength by at least 1/2 MMT to promote ability to perform standing tasks.     Time  8    Period  Weeks    Status  New    Target Date  02/06/18      PT LONG TERM GOAL #3   Title  Patient will have a decrease in R shoulder pain to 2/10 or less at worst, and L shoulder pain and 2/10 at worst to promote ability to raise her arms up.  Baseline  5/10 bilateral shoulder pain at worst (12/10/2017)    Time  8    Period  Weeks    Status  New    Target Date  02/06/18      PT LONG TERM GOAL #4   Title  Patient will improve bilateral shoulder ER strength to promote ability to raise her arms up more comfortably.      Time  8    Period  Weeks    Status  New    Target Date  02/06/18      PT LONG TERM GOAL #5   Title  Pt will be able to perform TUG in 11 seconds or less safely to promote balance.     Baseline  9.67 seconds average without AD but unsteady, CGA to SBA (12/10/2017)    Time  8    Period  Weeks    Status  New    Target Date  02/06/18            Plan - 01/09/18 1914    Clinical Impression Statement  Decreased R shoulder pain with adduction following manual therapy to decrease tension to teres major and infraspinatus muscles and decreased pain with shoulder flexion with addition of manual therapy to decrease R upper trap and pectoralis muscle tension as well as scapular retraction during shoulder flexion. Exercises not performed secondary to pt still having a hemorrhage in her R eye as well as due to elevated blood pressure.     Rehab Potential  Fair    Clinical Impairments Affecting Rehab Potential  Decreased balance, multiple areas of pain (R and L shoulder, L knee), decreased balance, difficulty raising both arms, weakness, difficulty walking, difficulty getting into and out of bed, the car, difficulty negotiating stairs    PT Frequency  2x / week    PT Duration  8 weeks    PT Treatment/Interventions  Therapeutic activities;Therapeutic exercise;Neuromuscular re-education;Patient/family education;Manual techniques;Dry needling;Aquatic Therapy;Electrical Stimulation;Iontophoresis 61m/ml Dexamethasone;Gait training    PT Next Visit Plan  glute med and max strengthening, scapular and ER muscle strengthening, manual techniques, modalities PRN    Consulted and Agree with Plan of Care  Patient       Patient will benefit from skilled therapeutic intervention in order to improve the following deficits and impairments:  Pain, Postural dysfunction, Improper body mechanics, Decreased strength, Difficulty walking, Decreased range of motion, Decreased balance  Visit Diagnosis: Muscle weakness  (generalized)  Chronic right shoulder pain  Chronic pain of left knee  Difficulty in walking, not elsewhere classified     Problem List Patient Active Problem List   Diagnosis Date Noted  . Acute bilateral low back pain 06/18/2017  . Aortic heart murmur on examination 12/06/2015  . Urinary incontinence, urge 12/06/2015  . Gait instability 12/06/2015  . Shortness of breath 12/06/2015  . Memory loss 05/03/2015  . Allergic rhinitis 05/02/2015  . Blood glucose elevated 05/02/2015  . HLD (hyperlipidemia) 05/02/2015  . Essential hypertension 05/02/2015  . Adult hypothyroidism 05/02/2015  . Malaise and fatigue 05/02/2015  . Mild mitral regurgitation by prior echocardiogram 05/02/2015  . Obesity (BMI 35.0-39.9 without comorbidity) 05/02/2015  . Osteoarthritis, multiple sites 05/02/2015  . Anxiety 01/27/2015  . Liver cirrhosis secondary to NASH (nonalcoholic steatohepatitis) ([facility] 01/12/2015  . Hammer toe 11/18/2014  . Hallux rigidus of right foot 11/18/2014  . Hallux rigidus of left foot 11/18/2014  . Chronic right shoulder pain 10/26/2014  . Peripheral nerve disease 10/22/2012    MJoneen BoersPT,  DPT   01/09/2018, 9:45 PM  [facility] [address] [place], [address] Phone: [phone]   Fax:  [phone]  Name: Anita Bennett MRN: [id number] Date of Birth: 05-Apr-1941

## 2018-01-09 NOTE — Patient Instructions (Signed)
Pt was recommended to use a heating pad to arm pit area to decrease teres major and infraspinatus muscle tension to promote ability to reach across with less pain  As well as perform scapular retraction when raising her R arm up to help decrease pain in  R shoulder.

## 2018-01-14 ENCOUNTER — Ambulatory Visit: Payer: Medicare Other

## 2018-01-20 ENCOUNTER — Ambulatory Visit: Payer: Medicare Other | Attending: Gastroenterology

## 2018-01-20 DIAGNOSIS — M6281 Muscle weakness (generalized): Secondary | ICD-10-CM | POA: Diagnosis not present

## 2018-01-20 DIAGNOSIS — M25511 Pain in right shoulder: Secondary | ICD-10-CM | POA: Insufficient documentation

## 2018-01-20 DIAGNOSIS — M25512 Pain in left shoulder: Secondary | ICD-10-CM | POA: Diagnosis not present

## 2018-01-20 DIAGNOSIS — M25562 Pain in left knee: Secondary | ICD-10-CM | POA: Diagnosis not present

## 2018-01-20 DIAGNOSIS — Z9181 History of falling: Secondary | ICD-10-CM | POA: Diagnosis not present

## 2018-01-20 DIAGNOSIS — R262 Difficulty in walking, not elsewhere classified: Secondary | ICD-10-CM | POA: Diagnosis not present

## 2018-01-20 DIAGNOSIS — G8929 Other chronic pain: Secondary | ICD-10-CM | POA: Diagnosis not present

## 2018-01-20 DIAGNOSIS — M25572 Pain in left ankle and joints of left foot: Secondary | ICD-10-CM | POA: Diagnosis not present

## 2018-01-20 NOTE — Patient Instructions (Signed)
Heating pad behind L thigh at muscle 15 minutes with cloth barrier 3-4 times a day   Move your left ankle back and forth throughout the day.

## 2018-01-20 NOTE — Therapy (Signed)
[facility] [address] [place], [address] Phone: [phone]   Fax:  [phone]  Physical Therapy Treatment  Patient Details  Name: Anita Bennett MRN: [id number] Date of Birth: Feb 21, 1941 Referring Provider: Cornelius Moras, NP   Encounter Date: 01/20/2018  PT End of Session - 01/20/18 1113    Visit Number  8    Number of Visits  17    Date for PT Re-Evaluation  02/06/18    Authorization Type  8    Authorization Time Period  of 10    PT Start Time  1113    PT Stop Time  1203    PT Time Calculation (min)  50 min    Equipment Utilized During Treatment  Gait belt    Activity Tolerance  Patient tolerated treatment well    Behavior During Therapy  [facility] for tasks assessed/performed       Past Medical History:  Diagnosis Date  . Arrhythmia   . H/O knee surgery    [place] [facility] (replacement)  . Heart murmur   . History of kidney stones   . History of toe surgery    12/01/14 [facility]  . Hyperlipidemia   . Hypertension   . Thyroid disease     Past Surgical History:  Procedure Laterality Date  . arm surgery    . BACK SURGERY    . CHOLECYSTECTOMY    . TOE SURGERY    . TOTAL ABDOMINAL HYSTERECTOMY    . TOTAL KNEE ARTHROPLASTY Bilateral     There were no vitals filed for this visit.  Subjective Assessment - 01/20/18 1116    Subjective  Pt states she hurt her L ankle while turning in the kitchen Saturday. Pt states that her ankle pain is getting worse. Feels pain L anteriomedial ankle.  Pt states she took her blood pressure medicine.  L knee is 3-4/10 when walking, L ankle hurts when it catches 6/10.  R shoulder is not as bad as it has been. 3/10 R shoulder pain at worst for the past 7 days.  L knee feels worse. Its hurting when walking on it.  Has a doctor's appointment for her L knee tomorrow.     Pertinent History  Pt states falling onto her L knee around December 27/28, 2018. Pt states she has bad  balance. Pt stepped off the curb and fell. Pt landed onto her hands and L knee.  Pt fractured her L patella during the fall. Pt also states that she also had a tear in her R rotator cuff prior to her fall. L shoulder hurts but does not know if she hurt it during the fall. Raising her arms up bothers her. L knee still bothers her.   Pt was in a knee brace L LE for 2.5 months after the fall.  Pt states that her doctor thought it was healing around March 2019. Had home health PT until towards the end of March.  Going back to [facility] to see her knee surgeon at the last week of May, 2019.  Pt also states that she has memory difficulties due to cirrhosis.  L knee bothers her more than her shoulders.  L knee feels like it tingles or stings when something touches it.  Uses a SPC for longer distance walking since after the fall. [facility] [place] again after returning home from her doctor's visit in March.  Pt was gently pushing her great granddaughter on a swing. No  other falls for the last 6 months.   Pt states having a fear of falling.   R shoulder bothers her the most compared to L. Pt is R hand dominant.     Patient Stated Goals  Pick up her grandchildren with less pain. Be able to walk better out in the yard. Improve Balance    Currently in Pain?  Yes    Pain Score  6     Pain Onset  More than a month ago                               PT Education - 01/20/18 1322    Education provided  Yes    Education Details  ther-ex, HEP    Person(s) Educated  Patient    Methods  Explanation;Demonstration;Tactile cues;Verbal cues;Handout    Comprehension  Returned demonstration;Verbalized understanding         Objectives  Therapeutic exercise  Blood pressure L arm sitting, mechanically taken: 175/67, HR 62 at start of session.   Blood pressure L arm sitting, manually taken 168/78 at start of session.  Worked on L ankle to help with balance.   L ankle DF, PF, EV, IV AROM, then with overpressure.  No pain except with ankle DF with reproduction of symptoms.   Limited L ankle DF compared to R  Seated L ankle PF/DF on rockerboard 1 min x 2   Seated hip adduction ball squeeze, pain free level of effort 10x10 seconds  Reviewed HEP. Pt verbalized understanding.   Improved exercise technique, movement at target joints, use of target muscles after mod verbal, visual, tactile cues.        Manual therapy  Seated sustained gentle posterior glide L ankle to decrease anterior pinching when walking  reproduction of L medial knee pain with palpation to L medial hamstrings Seated STM L medial hamstrings   Pt states being better able to move her L leg with less knee pain   Decreased L ankle pain with promoting posterior glide at of tallus. Pain however eventually returns with increased standing and walking. Decreased L medial knee pain with STM to decrease L medial hamstrings tension. Worked on decreasing L ankle pain as well today to promote ability to weight bear on L LE and improve balance.                   PT Long Term Goals - 12/10/17 1249      PT LONG TERM GOAL #1   Title  Patient will have a decrease in L knee pain to 4/10 at worst to promote ability to ambulate and perform transfers more comfortably.     Baseline  7/10 L knee pain at worst for the past 2 months (12/10/2017)    Time  8    Period  Weeks    Status  New    Target Date  02/06/18      PT LONG TERM GOAL #2   Title  Patient will improve L LE strength by at least 1/2 MMT to promote ability to perform standing tasks.     Time  8    Period  Weeks    Status  New    Target Date  02/06/18      PT LONG TERM GOAL #3   Title  Patient will have a decrease in R shoulder pain to 2/10 or less at worst, and L shoulder pain and  2/10 at worst to promote ability to raise her arms up.     Baseline  5/10 bilateral shoulder pain at worst (12/10/2017)    Time  8    Period  Weeks    Status  New    Target Date   02/06/18      PT LONG TERM GOAL #4   Title  Patient will improve bilateral shoulder ER strength to promote ability to raise her arms up more comfortably.     Time  8    Period  Weeks    Status  New    Target Date  02/06/18      PT LONG TERM GOAL #5   Title  Pt will be able to perform TUG in 11 seconds or less safely to promote balance.     Baseline  9.67 seconds average without AD but unsteady, CGA to SBA (12/10/2017)    Time  8    Period  Weeks    Status  New    Target Date  02/06/18            Plan - 01/20/18 1108    Clinical Impression Statement  Decreased L ankle pain with promoting posterior glide at of tallus. Pain however eventually returns with increased standing and walking. Decreased L medial knee pain with STM to decrease L medial hamstrings tension. Worked on decreasing L ankle pain as well today to promote ability to weight bear on L LE and improve balance.     Rehab Potential  Fair    Clinical Impairments Affecting Rehab Potential  Decreased balance, multiple areas of pain (R and L shoulder, L knee), decreased balance, difficulty raising both arms, weakness, difficulty walking, difficulty getting into and out of bed, the car, difficulty negotiating stairs    PT Frequency  2x / week    PT Duration  8 weeks    PT Treatment/Interventions  Therapeutic activities;Therapeutic exercise;Neuromuscular re-education;Patient/family education;Manual techniques;Dry needling;Aquatic Therapy;Electrical Stimulation;Iontophoresis 36m/ml Dexamethasone;Gait training    PT Next Visit Plan  glute med and max strengthening, scapular and ER muscle strengthening, manual techniques, modalities PRN    Consulted and Agree with Plan of Care  Patient       Patient will benefit from skilled therapeutic intervention in order to improve the following deficits and impairments:  Pain, Postural dysfunction, Improper body mechanics, Decreased strength, Difficulty walking, Decreased range of motion,  Decreased balance  Visit Diagnosis: Muscle weakness (generalized)  Chronic right shoulder pain  Chronic pain of left knee  Difficulty in walking, not elsewhere classified     Problem List Patient Active Problem List   Diagnosis Date Noted  . Acute bilateral low back pain 06/18/2017  . Aortic heart murmur on examination 12/06/2015  . Urinary incontinence, urge 12/06/2015  . Gait instability 12/06/2015  . Shortness of breath 12/06/2015  . Memory loss 05/03/2015  . Allergic rhinitis 05/02/2015  . Blood glucose elevated 05/02/2015  . HLD (hyperlipidemia) 05/02/2015  . Essential hypertension 05/02/2015  . Adult hypothyroidism 05/02/2015  . Malaise and fatigue 05/02/2015  . Mild mitral regurgitation by prior echocardiogram 05/02/2015  . Obesity (BMI 35.0-39.9 without comorbidity) 05/02/2015  . Osteoarthritis, multiple sites 05/02/2015  . Anxiety 01/27/2015  . Liver cirrhosis secondary to NASH (nonalcoholic steatohepatitis) ([facility] 01/12/2015  . Hammer toe 11/18/2014  . Hallux rigidus of right foot 11/18/2014  . Hallux rigidus of left foot 11/18/2014  . Chronic right shoulder pain 10/26/2014  . Peripheral nerve disease 10/22/2012    MJoneen Boers  PT, DPT   01/20/2018, 1:25 PM  [facility] [address], [place], [address] Phone: [phone]   Fax:  [phone]  Name: Anita Bennett MRN: [id number] Date of Birth: 1940/10/01

## 2018-01-21 ENCOUNTER — Ambulatory Visit: Payer: Medicare Other

## 2018-01-21 DIAGNOSIS — M79672 Pain in left foot: Secondary | ICD-10-CM | POA: Diagnosis not present

## 2018-01-21 DIAGNOSIS — Z96652 Presence of left artificial knee joint: Secondary | ICD-10-CM | POA: Diagnosis not present

## 2018-01-21 DIAGNOSIS — Z96651 Presence of right artificial knee joint: Secondary | ICD-10-CM | POA: Diagnosis not present

## 2018-01-21 DIAGNOSIS — M170 Bilateral primary osteoarthritis of knee: Secondary | ICD-10-CM | POA: Diagnosis not present

## 2018-01-21 DIAGNOSIS — S82032P Displaced transverse fracture of left patella, subsequent encounter for closed fracture with malunion: Secondary | ICD-10-CM | POA: Insufficient documentation

## 2018-01-21 DIAGNOSIS — M76822 Posterior tibial tendinitis, left leg: Secondary | ICD-10-CM | POA: Insufficient documentation

## 2018-01-21 DIAGNOSIS — T8459XS Infection and inflammatory reaction due to other internal joint prosthesis, sequela: Secondary | ICD-10-CM | POA: Diagnosis not present

## 2018-01-21 DIAGNOSIS — M25562 Pain in left knee: Secondary | ICD-10-CM | POA: Diagnosis not present

## 2018-01-21 DIAGNOSIS — Z96659 Presence of unspecified artificial knee joint: Secondary | ICD-10-CM | POA: Diagnosis not present

## 2018-01-22 ENCOUNTER — Ambulatory Visit: Payer: Medicare Other

## 2018-01-22 DIAGNOSIS — Z9181 History of falling: Secondary | ICD-10-CM | POA: Diagnosis not present

## 2018-01-22 DIAGNOSIS — G8929 Other chronic pain: Secondary | ICD-10-CM | POA: Diagnosis not present

## 2018-01-22 DIAGNOSIS — M25562 Pain in left knee: Secondary | ICD-10-CM

## 2018-01-22 DIAGNOSIS — R262 Difficulty in walking, not elsewhere classified: Secondary | ICD-10-CM | POA: Diagnosis not present

## 2018-01-22 DIAGNOSIS — M6281 Muscle weakness (generalized): Secondary | ICD-10-CM

## 2018-01-22 DIAGNOSIS — M25511 Pain in right shoulder: Secondary | ICD-10-CM | POA: Diagnosis not present

## 2018-01-22 NOTE — Therapy (Signed)
[facility] [address], [place], [address] Phone: [phone]   Fax:  [phone]  Physical Therapy Treatment  Patient Details  Name: Anita Bennett MRN: [id number] Date of Birth: 23-Sep-1940 Referring Provider: Cornelius Moras, NP   Encounter Date: 01/22/2018  PT End of Session - 01/22/18 1034    Visit Number  9    Number of Visits  17    Date for PT Re-Evaluation  02/06/18    Authorization Type  9    Authorization Time Period  of 10    PT Start Time  1035    PT Stop Time  1122    PT Time Calculation (min)  47 min    Equipment Utilized During Treatment  Gait belt    Activity Tolerance  Patient tolerated treatment well    Behavior During Therapy  [facility] for tasks assessed/performed       Past Medical History:  Diagnosis Date  . Arrhythmia   . H/O knee surgery    2011 [place] knee (replacement)  . Heart murmur   . History of kidney stones   . History of toe surgery    12/01/14 [facility]  . Hyperlipidemia   . Hypertension   . Thyroid disease     Past Surgical History:  Procedure Laterality Date  . arm surgery    . BACK SURGERY    . CHOLECYSTECTOMY    . TOE SURGERY    . TOTAL ABDOMINAL HYSTERECTOMY    . TOTAL KNEE ARTHROPLASTY Bilateral     There were no vitals filed for this visit.  Subjective Assessment - 01/22/18 1036    Subjective  Pt states that the PA seemed a little bit annoyed. Pt states that she has to have a CT scan of her L knee. Not sure if the knee cap has healed yet.   5/10 L posterior leg pain (posterior tibialis).  5/10 L anterior medial ankle pain when walking.  2/10 L knee pain currently, 5/10 L knee when walking.     Pertinent History  Pt states falling onto her L knee around December 27/28, 2018. Pt states she has bad balance. Pt stepped off the curb and fell. Pt landed onto her hands and L knee.  Pt fractured her L patella during the fall. Pt also states that she also had a tear in her  R rotator cuff prior to her fall. L shoulder hurts but does not know if she hurt it during the fall. Raising her arms up bothers her. L knee still bothers her.   Pt was in a knee brace L LE for 2.5 months after the fall.  Pt states that her doctor thought it was healing around March 2019. Had home health PT until towards the end of March.  Going back to [facility] to see her knee surgeon at the last week of May, 2019.  Pt also states that she has memory difficulties due to cirrhosis.  L knee bothers her more than her shoulders.  L knee feels like it tingles or stings when something touches it.  Uses a SPC for longer distance walking since after the fall. [place] again after returning home from her doctor's visit in March.  Pt was gently pushing her great granddaughter on a swing. No other falls for the last 6 months.   Pt states having a fear of falling.   R shoulder bothers her the most compared to L. Pt is R hand  dominant.     Patient Stated Goals  Pick up her grandchildren with less pain. Be able to walk better out in the yard. Improve Balance    Currently in Pain?  Yes    Pain Score  5     Pain Onset  More than a month ago                               PT Education - 01/22/18 1101    Education provided  Yes    Education Details  ther-ex, HEP    Person(s) Educated  Patient    Methods  Explanation;Demonstration;Tactile cues;Verbal cues;Handout    Comprehension  Verbalized understanding;Returned demonstration         Objectives  Per PA, focus now should be on the knee.   Therapeutic exercise  Blood pressure L arm sitting, manually taken 164/70 at start of session.  Seated LAQ L knee 10x2 with 5 second holds. No complain of knee pain   Seated plantarflexion isometrics 40% effort 1 min with 1 min rest breaks for 3 times  Helped pt apply ankle brace on L per request. Increased time secondary to answering pt questions  Standing L hip abduction with bilateral UE assist  10x2  Reviewed HEP. Pt demonstrated and verbalized understanding.    Improved exercise technique, movement at target joints, use of target muscles after mod verbal, visual, tactile cues. Mod to max cues on focusing treatment as well.    Was working on both the shoulder and the knee based on original PT prescription. Currently focusing on the L knee and ankle based on recent instructions.  Added isometric plantar flexion to promote recovery of her tibialis posterior tendon, as well as worked on L quadriceps and L glute med strengthening/muscle use to promote ability to ambulate. Increased time spent on teaching pt how to properly place L ankle brace on L ankle and answer pt questions. Ankle brace originally found placed on her ankle upside down. Pt left clinic today with proper placement of L ankle brace.  Pt tolerated session well without aggravation of symptoms.        PT Long Term Goals - 12/10/17 1249      PT LONG TERM GOAL #1   Title  Patient will have a decrease in L knee pain to 4/10 at worst to promote ability to ambulate and perform transfers more comfortably.     Baseline  7/10 L knee pain at worst for the past 2 months (12/10/2017)    Time  8    Period  Weeks    Status  New    Target Date  02/06/18      PT LONG TERM GOAL #2   Title  Patient will improve L LE strength by at least 1/2 MMT to promote ability to perform standing tasks.     Time  8    Period  Weeks    Status  New    Target Date  02/06/18      PT LONG TERM GOAL #3   Title  Patient will have a decrease in R shoulder pain to 2/10 or less at worst, and L shoulder pain and 2/10 at worst to promote ability to raise her arms up.     Baseline  5/10 bilateral shoulder pain at worst (12/10/2017)    Time  8    Period  Weeks    Status  New  Target Date  02/06/18      PT LONG TERM GOAL #4   Title  Patient will improve bilateral shoulder ER strength to promote ability to raise her arms up more comfortably.     Time   8    Period  Weeks    Status  New    Target Date  02/06/18      PT LONG TERM GOAL #5   Title  Pt will be able to perform TUG in 11 seconds or less safely to promote balance.     Baseline  9.67 seconds average without AD but unsteady, CGA to SBA (12/10/2017)    Time  8    Period  Weeks    Status  New    Target Date  02/06/18            Plan - 01/22/18 1102    Clinical Impression Statement  Was working on both the shoulder and the knee based on original PT prescription. Currently focusing on the L knee and ankle based on recent instructions.  Added isometric plantar flexion to promote recovery of her tibialis posterior tendon, as well as worked on L quadriceps and L glute med strengthening/muscle use to promote ability to ambulate. Increased time spent on teaching pt how to properly place L ankle brace on L ankle and answer pt questions. Ankle brace originally found placed on her ankle upside down. Pt left clinic today with proper placement of L ankle brace.  Pt tolerated session well without aggravation of symptoms.      Rehab Potential  Fair    Clinical Impairments Affecting Rehab Potential  Decreased balance, multiple areas of pain (R and L shoulder, L knee), decreased balance, difficulty raising both arms, weakness, difficulty walking, difficulty getting into and out of bed, the car, difficulty negotiating stairs    PT Frequency  2x / week    PT Duration  8 weeks    PT Treatment/Interventions  Therapeutic activities;Therapeutic exercise;Neuromuscular re-education;Patient/family education;Manual techniques;Dry needling;Aquatic Therapy;Electrical Stimulation;Iontophoresis 34m/ml Dexamethasone;Gait training    PT Next Visit Plan  glute med and max strengthening, scapular and ER muscle strengthening, manual techniques, modalities PRN    Consulted and Agree with Plan of Care  Patient       Patient will benefit from skilled therapeutic intervention in order to improve the following  deficits and impairments:  Pain, Postural dysfunction, Improper body mechanics, Decreased strength, Difficulty walking, Decreased range of motion, Decreased balance  Visit Diagnosis: Muscle weakness (generalized)  Chronic pain of left knee  History of falling     Problem List Patient Active Problem List   Diagnosis Date Noted  . Acute bilateral low back pain 06/18/2017  . Aortic heart murmur on examination 12/06/2015  . Urinary incontinence, urge 12/06/2015  . Gait instability 12/06/2015  . Shortness of breath 12/06/2015  . Memory loss 05/03/2015  . Allergic rhinitis 05/02/2015  . Blood glucose elevated 05/02/2015  . HLD (hyperlipidemia) 05/02/2015  . Essential hypertension 05/02/2015  . Adult hypothyroidism 05/02/2015  . Malaise and fatigue 05/02/2015  . Mild mitral regurgitation by prior echocardiogram 05/02/2015  . Obesity (BMI 35.0-39.9 without comorbidity) 05/02/2015  . Osteoarthritis, multiple sites 05/02/2015  . Anxiety 01/27/2015  . Liver cirrhosis secondary to NASH ([place]coholic ste[place]patitis) [place] 01/12/[place]  . Hammer toe 11/18/2014  . Hallux rigidus of right foot 11/18/2014  . Hallux rigidus of left foot 11/18/2014  . Chronic right shoulder pain 10/26/2014  . Peripheral nerve disease 10/22/2012   MJoneen Boers  PT, DPT   01/22/2018, 12:50 PM  [facility] [address], [place], [address] Phone: [phone]   Fax:  [phone]  Name: Anita Bennett MRN: [id number] Date of Birth: 02-02-1941

## 2018-01-22 NOTE — Patient Instructions (Addendum)
EXTENSION: Sitting (Active)    Sit with feet flat. Straighten left knee. Pain free range for knee cap  Hold for 5 seconds  Complete __3_ sets of _10_ repetitions. Perform _2__ sessions per day.  http://gtsc.exer.us/268   Copyright  VHI. All rights reserved.       Seated plantar flexion isometrics  Sitting on a chair   Press down with with ball of your foot onto the floor with 40 % effort (pain free level of effort)    1 minute.    Then rest for 1 to 2 minutes    Repeat 5 times.     Perform 3 sets daily      ABDUCTION: Standing (Active)   Hold onto something sturdy for safety. Use kitchen counter.   Stand, feet flat. Lift left leg out to side. Comfortably. Use _0__ lbs.  Complete __10_ repetitions. Perform __3_ sessions per day.

## 2018-01-27 ENCOUNTER — Ambulatory Visit: Payer: Medicare Other

## 2018-01-27 DIAGNOSIS — M25511 Pain in right shoulder: Secondary | ICD-10-CM

## 2018-01-27 DIAGNOSIS — M25572 Pain in left ankle and joints of left foot: Secondary | ICD-10-CM

## 2018-01-27 DIAGNOSIS — Z9181 History of falling: Secondary | ICD-10-CM

## 2018-01-27 DIAGNOSIS — G8929 Other chronic pain: Secondary | ICD-10-CM | POA: Diagnosis not present

## 2018-01-27 DIAGNOSIS — M25562 Pain in left knee: Principal | ICD-10-CM

## 2018-01-27 DIAGNOSIS — R262 Difficulty in walking, not elsewhere classified: Secondary | ICD-10-CM | POA: Diagnosis not present

## 2018-01-27 DIAGNOSIS — M6281 Muscle weakness (generalized): Secondary | ICD-10-CM | POA: Diagnosis not present

## 2018-01-27 DIAGNOSIS — M25512 Pain in left shoulder: Secondary | ICD-10-CM

## 2018-01-27 NOTE — Therapy (Signed)
[facility] [address] [place], [address] Phone: [phone]   Fax:  [phone]  Physical Therapy Treatment And Progress Report   Patient Details  Name: Anita Bennett MRN: [id number] Date of Birth: Jul 25, 1940 Referring Provider: Cornelius Moras, NP; Tacey Heap, PA   Encounter Date: 01/27/2018  PT End of Session - 01/27/18 1120    Visit Number  10    Number of Visits  29    Date for PT Re-Evaluation  03/13/18    Authorization Type  1    Authorization Time Period  of 10    PT Start Time  1121    PT Stop Time  1205    PT Time Calculation (min)  44 min    Equipment Utilized During Treatment  Gait belt    Activity Tolerance  Patient tolerated treatment well    Behavior During Therapy  WFL for tasks assessed/performed       Past Medical History:  Diagnosis Date  . Arrhythmia   . H/O knee surgery    2011 [place] knee (replacement)  . Heart murmur   . History of kidney stones   . History of toe surgery    12/01/14 [facility]  . Hyperlipidemia   . Hypertension   . Thyroid disease     Past Surgical History:  Procedure Laterality Date  . arm surgery    . BACK SURGERY    . CHOLECYSTECTOMY    . TOE SURGERY    . TOTAL ABDOMINAL HYSTERECTOMY    . TOTAL KNEE ARTHROPLASTY Bilateral     There were no vitals filed for this visit.  Subjective Assessment - 01/27/18 1124    Subjective  Did not wear her L ankle brace because her L ankle and knee feels better.  0/10 L knee and ankle pain when walking. Also walked around the pool during the weekend.  Pt states being active during the weekend.  The numbness in the back of her L leg is better.       Pertinent History  Pt states falling onto her L knee around December 27/28, 2018. Pt states she has bad balance. Pt stepped off the curb and fell. Pt landed onto her hands and L knee.  Pt fractured her L patella during the fall. Pt also states that she also had a tear  in her R rotator cuff prior to her fall. L shoulder hurts but does not know if she hurt it during the fall. Raising her arms up bothers her. L knee still bothers her.   Pt was in a knee brace L LE for 2.5 months after the fall.  Pt states that her doctor thought it was healing around March 2019. Had home health PT until towards the end of March.  Going back to [facility] to see her knee surgeon at the last week of May, 2019.  Pt also states that she has memory difficulties due to cirrhosis.  L knee bothers her more than her shoulders.  L knee feels like it tingles or stings when something touches it.  Uses a SPC for longer distance walking since after the fall. [place] again after returning home from her doctor's visit in March.  Pt was gently pushing her great granddaughter on a swing. No other falls for the last 6 months.   Pt states having a fear of falling.   R shoulder bothers her the most compared to L. Pt is R hand dominant.  Patient Stated Goals  Pick up her grandchildren with less pain. Be able to walk better out in the yard. Improve Balance    Currently in Pain?  No/denies    Pain Score  0-No pain    Pain Onset  More than a month ago         [facility] PT Assessment - 01/27/18 1137      Assessment   Referring Provider  Cornelius Moras, NP; Tacey Heap, PA      Observation/Other Assessments   Observations  TUG: 9.92 seconds on average, no use of AD. More steady compared to eval observed.       Strength   Right Shoulder External Rotation  4/5    Left Shoulder External Rotation  4/5    Right Hip ABduction  5/5 seated manually resisted clamshell isometrics    Left Hip Flexion  4/5    Left Hip Extension  4/5 no L knee pain    Left Hip ABduction  5/5 seated manually resisted clamshell isometrics    Left Knee Flexion  5/5    Left Knee Extension  5/5 no pain                           PT Education - 01/27/18 1131    Education provided  Yes    Education Details  ther-ex     [employer]) Educated  Patient    Methods  Explanation;Demonstration;Tactile cues;Verbal cues    Comprehension  Returned demonstration;Verbalized understanding          Objectives    Therapeutic exercise  Blood pressure L arm sitting, manually taken 170/80at start of session.  Seated LAQ L knee 10x with 5 second holds.   Then  10x10 seconds. No complain of knee pain  Seated manually resisted L hip flexion, hip extension, clamshell isometric, knee flexion, knee extension 1-2x each way  Reviewed progress/current status with L LE strength with pt.  Reviewed plan of care  2x/week for 6 weeks  Seated ankle DF/PF on rocker board 2 min  Seated shoulder ER manually resisted 1x each UE  Reviewed current status with strength with pt.    Standing up from a chair, walking 10 ft forward, then returning 10 ft, then sitting back onto chair 3x  11.54 (slight use of SPC), 9.46 no use of AD (more steady observed compared to at time of eval), 8.78 without SPC. More steady compared at time of eval. (9.92 seconds on average)   Pt was encouraged to continue exercising at the pool ([facility] [employer] Pt verbalized understanding  Standing L LE weight shifting with bilateral UE assist 10x5 seconds to promote L LE quadriceps strengthening  Standing R tip toe with bilateral UE assist to promote L quadriceps strengthening.    Improved exercise technique, movement at target joints, use of target muscles after mod verbal, visual, tactile cues.    Was working on both the shoulders and the knee based on original PT instructions. Currently focusing predominantly on L LE strengthening and function based on current instructions. Continued working on open and closed chain L quadriceps strengthening to promote ability to support herself while walking as well as when performing standing tasks, and worked on isometric plantar flexion in neutral in a pain free level of effort to promote healing of  her posterior tibial tendon.  No complain of L knee or ankle pain when walking at start of session today in which  pt reports of exercising in the pool this weekend might have played a factor. Pt also demonstrates overall improved L LE strength, improved steadiness with TUG and decreased overall L shoulder pain since initial evaluation. Patient still demonstrates L LE weakness, L knee and ankle pain, bilateral shoulder pain, and difficulty ambulating and performing standing tasks and would benefit from continued skilled physical therapy services to address the aforementioned deficits.         PT Long Term Goals - 01/27/18 1133      PT LONG TERM GOAL #1   Title  Patient will have a decrease in L knee pain to 4/10 at worst to promote ability to ambulate and perform transfers more comfortably.     Baseline  7/10 L knee pain at worst for the past 2 months (12/10/2017); 5/10 L knee pain at most for the past 7 days (01/27/2018)    Time  6    Period  Weeks    Status  On-going    Target Date  03/13/18      PT LONG TERM GOAL #2   Title  Patient will improve L LE strength by at least 1/2 MMT to promote ability to perform standing tasks.     Time  6    Period  Weeks    Status  Partially Met    Target Date  03/13/18      PT LONG TERM GOAL #3   Title  Patient will have a decrease in R shoulder pain to 2/10 or less at worst, and L shoulder pain and 2/10 at worst to promote ability to raise her arms up.     Baseline  5/10 bilateral shoulder pain at worst (12/10/2017); 5/10 R shoulder pain, 3/10 L shoulder pain at most for the past 7 days (01/27/2018)    Time  6    Period  Weeks    Status  On-going    Target Date  03/13/18      PT LONG TERM GOAL #4   Title  Patient will improve bilateral shoulder ER strength to promote ability to raise her arms up more comfortably.     Time  6    Period  Weeks    Status  On-going    Target Date  03/13/18      PT LONG TERM GOAL #5   Title  Pt will be able to perform  TUG in 11 seconds or less safely to promote balance.     Baseline  9.67 seconds average without AD but unsteady, CGA to SBA (12/10/2017); 9.92 seconds average without AD, more steady compared to eval (01/27/2018)    Time  6    Period  Weeks    Status  On-going    Target Date  03/13/18            Plan - 01/27/18 1131    Clinical Impression Statement  Was working on both the shoulders and the knee based on original PT instructions. Currently focusing predominantly on L LE strengthening and function based on current instructions. Continued working on open and closed chain L quadriceps strengthening to promote ability to support herself while walking as well as when performing standing tasks, and worked on isometric plantar flexion in neutral in a pain free level of effort to promote healing of her posterior tibial tendon.  No complain of L knee or ankle pain when walking at start of session today in which pt reports of exercising in the pool this weekend  might have played a factor. Pt also demonstrates overall improved L LE strength, improved steadiness with TUG and decreased overall L shoulder pain since initial evaluation. Patient still demonstrates L LE weakness, L knee and ankle pain, bilateral shoulder pain, and difficulty ambulating and performing standing tasks and would benefit from continued skilled physical therapy services to address the aforementioned deficits.     History and Personal Factors relevant to plan of care:  Decreased balance, multiple areas of pain (R and L shoulder, L knee, L ankle), decreased balance, difficulty raising both arms due to shoulder pain, weakness, difficulty walking, difficulty negotiating stairs, getting into and out of the car    Clinical Presentation  Stable    Clinical Presentation due to:  decreased L knee and ankle pain    Clinical Decision Making  Low    Rehab Potential  Fair    Clinical Impairments Affecting Rehab Potential  Decreased balance, multiple  areas of pain (R and L shoulder, L knee), decreased balance, difficulty raising both arms, weakness, difficulty walking, difficulty getting into and out of bed, the car, difficulty negotiating stairs    PT Frequency  2x / week    PT Duration  6 weeks    PT Treatment/Interventions  Therapeutic activities;Therapeutic exercise;Neuromuscular re-education;Patient/family education;Manual techniques;Dry needling;Aquatic Therapy;Electrical Stimulation;Iontophoresis 63m/ml Dexamethasone;Gait training    PT Next Visit Plan  glute med and max strengthening, scapular and ER muscle strengthening, manual techniques, modalities PRN    Consulted and Agree with Plan of Care  Patient       Patient will benefit from skilled therapeutic intervention in order to improve the following deficits and impairments:  Pain, Postural dysfunction, Improper body mechanics, Decreased strength, Difficulty walking, Decreased range of motion, Decreased balance  Visit Diagnosis: Chronic pain of left knee - Plan: PT plan of care cert/re-cert, PT plan of care cert/re-cert  History of falling - Plan: PT plan of care cert/re-cert, PT plan of care cert/re-cert  Chronic right shoulder pain - Plan: PT plan of care cert/re-cert, PT plan of care cert/re-cert  Difficulty in walking, not elsewhere classified - Plan: PT plan of care cert/re-cert, PT plan of care cert/re-cert  Muscle weakness (generalized) - Plan: PT plan of care cert/re-cert, PT plan of care cert/re-cert  Chronic left shoulder pain - Plan: PT plan of care cert/re-cert, PT plan of care cert/re-cert  Pain in left ankle and joints of left foot - Plan: PT plan of care cert/re-cert, PT plan of care cert/re-cert     Problem List Patient Active Problem List   Diagnosis Date Noted  . Acute bilateral low back pain 06/18/2017  . Aortic heart murmur on examination 12/06/2015  . Urinary incontinence, urge 12/06/2015  . Gait instability 12/06/2015  . Shortness of breath  12/06/2015  . Memory loss 05/03/2015  . Allergic rhinitis 05/02/2015  . Blood glucose elevated 05/02/2015  . HLD (hyperlipidemia) 05/02/2015  . Essential hypertension 05/02/2015  . Adult hypothyroidism 05/02/2015  . Malaise and fatigue 05/02/2015  . Mild mitral regurgitation by prior echocardiogram 05/02/2015  . Obesity (BMI 35.0-39.9 without comorbidity) 05/02/2015  . Osteoarthritis, multiple sites 05/02/2015  . Anxiety 01/27/2015  . Liver cirrhosis secondary to NASH (nonalcoholic steatohepatitis) ([facility] 01/12/2015  . Hammer toe 11/18/2014  . Hallux rigidus of right foot 11/18/2014  . Hallux rigidus of left foot 11/18/2014  . Chronic right shoulder pain 10/26/2014  . Peripheral nerve disease 10/22/2012   Thank you for your referral.  MJoneen BoersPT, DPT   01/27/2018, 1:16  PM  [facility] [address] [place], [address] Phone: [phone]   Fax:  [phone]  Name: SHARLYNN SECKINGER MRN: [id number] Date of Birth: September 11, 1940

## 2018-01-28 DIAGNOSIS — L812 Freckles: Secondary | ICD-10-CM | POA: Diagnosis not present

## 2018-01-28 DIAGNOSIS — D225 Melanocytic nevi of trunk: Secondary | ICD-10-CM | POA: Diagnosis not present

## 2018-01-28 DIAGNOSIS — L821 Other seborrheic keratosis: Secondary | ICD-10-CM | POA: Diagnosis not present

## 2018-01-28 DIAGNOSIS — D179 Benign lipomatous neoplasm, unspecified: Secondary | ICD-10-CM | POA: Diagnosis not present

## 2018-01-28 DIAGNOSIS — L820 Inflamed seborrheic keratosis: Secondary | ICD-10-CM | POA: Diagnosis not present

## 2018-01-29 ENCOUNTER — Ambulatory Visit: Payer: Medicare Other

## 2018-01-29 DIAGNOSIS — M25511 Pain in right shoulder: Secondary | ICD-10-CM | POA: Diagnosis not present

## 2018-01-29 DIAGNOSIS — Z9181 History of falling: Secondary | ICD-10-CM | POA: Diagnosis not present

## 2018-01-29 DIAGNOSIS — M6281 Muscle weakness (generalized): Secondary | ICD-10-CM | POA: Diagnosis not present

## 2018-01-29 DIAGNOSIS — R262 Difficulty in walking, not elsewhere classified: Secondary | ICD-10-CM | POA: Diagnosis not present

## 2018-01-29 DIAGNOSIS — M25562 Pain in left knee: Secondary | ICD-10-CM | POA: Diagnosis not present

## 2018-01-29 DIAGNOSIS — M25572 Pain in left ankle and joints of left foot: Secondary | ICD-10-CM

## 2018-01-29 DIAGNOSIS — G8929 Other chronic pain: Secondary | ICD-10-CM | POA: Diagnosis not present

## 2018-01-29 NOTE — Therapy (Signed)
[facility] [address], [place], [address] Phone: [phone]   Fax:  [phone]  Physical Therapy Treatment  Patient Details  Name: Anita Bennett MRN: [id number] Date of Birth: Dec 25, 1940 Referring Provider: Cornelius Moras, NP; Tacey Heap, PA   Encounter Date: 01/29/2018  PT End of Session - 01/29/18 1031    Visit Number  11    Number of Visits  29    Date for PT Re-Evaluation  03/13/18    Authorization Type  2    Authorization Time Period  of 10    PT Start Time  1032    PT Stop Time  1116    PT Time Calculation (min)  44 min    Equipment Utilized During Treatment  Gait belt    Activity Tolerance  Patient tolerated treatment well    Behavior During Therapy  WFL for tasks assessed/performed       Past Medical History:  Diagnosis Date  . Arrhythmia   . H/O knee surgery    2011 [facility] knee (replacement)  . Heart murmur   . History of kidney stones   . History of toe surgery    12/01/14 [facility]  . Hyperlipidemia   . Hypertension   . Thyroid disease     Past Surgical History:  Procedure Laterality Date  . arm surgery    . BACK SURGERY    . CHOLECYSTECTOMY    . TOE SURGERY    . TOTAL ABDOMINAL HYSTERECTOMY    . TOTAL KNEE ARTHROPLASTY Bilateral     There were no vitals filed for this visit.  Subjective Assessment - 01/29/18 1034    Subjective  L ankle is fine, L knee is pretty good, bothers her a little bit when she walks. Going to [place] Sunday for 2 weeks.  3/10 L knee pain when walking, 0/10 L ankle when walking. Forgot to bring her cane this morning.     Pertinent History  Pt states falling onto her L knee around December 27/28, 2018. Pt states she has bad balance. Pt stepped off the curb and fell. Pt landed onto her hands and L knee.  Pt fractured her L patella during the fall. Pt also states that she also had a tear in her R rotator cuff prior to her fall. L shoulder hurts but does  not know if she hurt it during the fall. Raising her arms up bothers her. L knee still bothers her.   Pt was in a knee brace L LE for 2.5 months after the fall.  Pt states that her doctor thought it was healing around March 2019. Had home health PT until towards the end of March.  Going back to [facility] to see her knee surgeon at the last week of May, 2019.  Pt also states that she has memory difficulties due to cirrhosis.  L knee bothers her more than her shoulders.  L knee feels like it tingles or stings when something touches it.  Uses a SPC for longer distance walking since after the fall. [facility] [place] again after returning home from her doctor's visit in March.  Pt was gently pushing her great granddaughter on a swing. No other falls for the last 6 months.   Pt states having a fear of falling.   R shoulder bothers her the most compared to L. Pt is R hand dominant.     Patient Stated Goals  Pick up her grandchildren with less pain.  Be able to walk better out in the yard. Improve Balance    Currently in Pain?  Yes    Pain Score  3  L knee    Pain Onset  More than a month ago                               PT Education - 01/29/18 1040    Education provided  Yes    Education Details  ther-ex, HEP    Person(s) Educated  Patient    Methods  Explanation;Demonstration;Tactile cues;Verbal cues    Comprehension  Returned demonstration;Verbalized understanding         Objectives    Therapeutic exercise  Blood pressure L arm sitting, manually taken 152/78at start of session.  Seated LAQ L knee 10x2 with 10 second holds (upgrade). Good quadriceps muscle use felt, no knee pain.              Seated ankle DF/PF on rocker board 2 min  Side stepping 5 ft to the R and 5 ft to the L with bilateral UE assist 10x  Good glute med muscle use felt  Standing R tip toe with bilateral UE assist to promote L quadriceps strengthening. 10x with 5 second holds. L knee discomfort with eases  with rest.   Standing L LE weight shifting with bilateral UE assist 10x5 seconds for 3 sets to promote L LE quadriceps strengthening  seated gentle STM L quadriceps x 5 min to promote more comfortable knee flexion  Improved exercise technique, movement at target joints, use of target muscles after mod verbal, visual, tactile cues.     Pt making good progress with decreased L ankle pain with pt able to ambulate on L LE without it hurting based on subjective reports. Continued working on L quadriceps and glute  strengthening to promote ability to perform standing tasks. Pt tolerated session well withtout aggravation of symptoms. Pt forgot her SPC today. Able to ambulate throughout clinic and outside on firm surfaces without LOB.       PT Long Term Goals - 01/27/18 1133      PT LONG TERM GOAL #1   Title  Patient will have a decrease in L knee pain to 4/10 at worst to promote ability to ambulate and perform transfers more comfortably.     Baseline  7/10 L knee pain at worst for the past 2 months (12/10/2017); 5/10 L knee pain at most for the past 7 days (01/27/2018)    Time  6    Period  Weeks    Status  On-going    Target Date  03/13/18      PT LONG TERM GOAL #2   Title  Patient will improve L LE strength by at least 1/2 MMT to promote ability to perform standing tasks.     Time  6    Period  Weeks    Status  Partially Met    Target Date  03/13/18      PT LONG TERM GOAL #3   Title  Patient will have a decrease in R shoulder pain to 2/10 or less at worst, and L shoulder pain and 2/10 at worst to promote ability to raise her arms up.     Baseline  5/10 bilateral shoulder pain at worst (12/10/2017); 5/10 R shoulder pain, 3/10 L shoulder pain at most for the past 7 days (01/27/2018)    Time  6  Period  Weeks    Status  On-going    Target Date  03/13/18      PT LONG TERM GOAL #4   Title  Patient will improve bilateral shoulder ER strength to promote ability to raise her arms up  more comfortably.     Time  6    Period  Weeks    Status  On-going    Target Date  03/13/18      PT LONG TERM GOAL #5   Title  Pt will be able to perform TUG in 11 seconds or less safely to promote balance.     Baseline  9.67 seconds average without AD but unsteady, CGA to SBA (12/10/2017); 9.92 seconds average without AD, more steady compared to eval (01/27/2018)    Time  6    Period  Weeks    Status  On-going    Target Date  03/13/18            Plan - 01/29/18 1030    Clinical Impression Statement  Pt making good progress with decreased L ankle pain with pt able to ambulate on L LE without it hurting based on subjective reports. Continued working on L quadriceps and glute  strengthening to promote ability to perform standing tasks. Pt tolerated session well withtout aggravation of symptoms. Pt forgot her SPC today. Able to ambulate throughout clinic and outside on firm surfaces without LOB.      Rehab Potential  Fair    Clinical Impairments Affecting Rehab Potential  Decreased balance, multiple areas of pain (R and L shoulder, L knee), decreased balance, difficulty raising both arms, weakness, difficulty walking, difficulty getting into and out of bed, the car, difficulty negotiating stairs    PT Frequency  2x / week    PT Duration  6 weeks    PT Treatment/Interventions  Therapeutic activities;Therapeutic exercise;Neuromuscular re-education;Patient/family education;Manual techniques;Dry needling;Aquatic Therapy;Electrical Stimulation;Iontophoresis 13m/ml Dexamethasone;Gait training    PT Next Visit Plan  glute med and max strengthening, scapular and ER muscle strengthening, manual techniques, modalities PRN    Consulted and Agree with Plan of Care  Patient       Patient will benefit from skilled therapeutic intervention in order to improve the following deficits and impairments:  Pain, Postural dysfunction, Improper body mechanics, Decreased strength, Difficulty walking, Decreased  range of motion, Decreased balance  Visit Diagnosis: Chronic pain of left knee  History of falling  Pain in left ankle and joints of left foot     Problem List Patient Active Problem List   Diagnosis Date Noted  . Acute bilateral low back pain 06/18/2017  . Aortic heart murmur on examination 12/06/2015  . Urinary incontinence, urge 12/06/2015  . Gait instability 12/06/2015  . Shortness of breath 12/06/2015  . Memory loss 05/03/2015  . Allergic rhinitis 05/02/2015  . Blood glucose elevated 05/02/2015  . HLD (hyperlipidemia) 05/02/2015  . Essential hypertension 05/02/2015  . Adult hypothyroidism 05/02/2015  . Malaise and fatigue 05/02/2015  . Mild mitral regurgitation by prior echocardiogram 05/02/2015  . Obesity (BMI 35.0-39.9 without comorbidity) 05/02/2015  . Osteoarthritis, multiple sites 05/02/2015  . Anxiety 01/27/2015  . Liver cirrhosis secondary to NASH (nonalcoholic steatohepatitis) ([facility] 01/12/2015  . Hammer toe 11/18/2014  . Hallux rigidus of right foot 11/18/2014  . Hallux rigidus of left foot 11/18/2014  . Chronic right shoulder pain 10/26/2014  . Peripheral nerve disease 10/22/2012     MJoneen BoersPT, DPT  01/29/2018, 11:27 AM  [facility] [address], [place], [address] Phone: [phone]   Fax:  [phone]  Name: Anita Bennett MRN: [id number] Date of Birth: Dec 12, 1940

## 2018-01-29 NOTE — Patient Instructions (Signed)
Standing L LE weight shifting with bilateral UE assist 10x5 seconds for 3 sets to promote L LE quadriceps strengthening. Given as part of her HEP. Pt demonstrated and verbalized understanding.

## 2018-01-30 DIAGNOSIS — X58XXXD Exposure to other specified factors, subsequent encounter: Secondary | ICD-10-CM | POA: Diagnosis not present

## 2018-01-30 DIAGNOSIS — S82032P Displaced transverse fracture of left patella, subsequent encounter for closed fracture with malunion: Secondary | ICD-10-CM | POA: Diagnosis not present

## 2018-02-03 ENCOUNTER — Ambulatory Visit: Payer: Medicare Other

## 2018-02-05 ENCOUNTER — Ambulatory Visit: Payer: Medicare Other

## 2018-02-18 ENCOUNTER — Ambulatory Visit: Payer: Medicare Other

## 2018-02-20 ENCOUNTER — Ambulatory Visit: Payer: Medicare Other

## 2018-02-24 ENCOUNTER — Ambulatory Visit: Payer: Medicare Other | Attending: Gastroenterology

## 2018-02-24 DIAGNOSIS — M6281 Muscle weakness (generalized): Secondary | ICD-10-CM | POA: Diagnosis not present

## 2018-02-24 DIAGNOSIS — Z9181 History of falling: Secondary | ICD-10-CM | POA: Insufficient documentation

## 2018-02-24 DIAGNOSIS — M25562 Pain in left knee: Secondary | ICD-10-CM | POA: Insufficient documentation

## 2018-02-24 DIAGNOSIS — M25512 Pain in left shoulder: Secondary | ICD-10-CM | POA: Insufficient documentation

## 2018-02-24 DIAGNOSIS — M25511 Pain in right shoulder: Secondary | ICD-10-CM | POA: Diagnosis not present

## 2018-02-24 DIAGNOSIS — G8929 Other chronic pain: Secondary | ICD-10-CM | POA: Insufficient documentation

## 2018-02-24 DIAGNOSIS — R262 Difficulty in walking, not elsewhere classified: Secondary | ICD-10-CM | POA: Diagnosis not present

## 2018-02-24 DIAGNOSIS — M25572 Pain in left ankle and joints of left foot: Secondary | ICD-10-CM | POA: Insufficient documentation

## 2018-02-24 NOTE — Therapy (Signed)
[facility] [address] [place], [address] Phone: [phone]   Fax:  [phone]  Physical Therapy Treatment  Patient Details  Name: Anita Bennett MRN: [id number] Date of Birth: [birth year] Referring Provider: Cornelius Moras, NP; Tacey Heap, PA   Encounter Date: 02/24/2018  PT End of Session - 02/24/18 1556    Visit Number  12    Number of Visits  29    Date for PT Re-Evaluation  03/13/18    Authorization Type  3    Authorization Time Period  of 10    PT Start Time  1556    PT Stop Time  1645    PT Time Calculation (min)  49 min    Equipment Utilized During Treatment  Gait belt    Activity Tolerance  Patient tolerated treatment well    Behavior During Therapy  WFL for tasks assessed/performed       Past Medical History:  Diagnosis Date  . Arrhythmia   . H/O knee surgery    2011 [facility] knee (replacement)  . Heart murmur   . History of kidney stones   . History of toe surgery    12/01/14 [facility]  . Hyperlipidemia   . Hypertension   . Thyroid disease     Past Surgical History:  Procedure Laterality Date  . arm surgery    . BACK SURGERY    . CHOLECYSTECTOMY    . TOE SURGERY    . TOTAL ABDOMINAL HYSTERECTOMY    . TOTAL KNEE ARTHROPLASTY Bilateral     There were no vitals filed for this visit.  Subjective Assessment - 02/24/18 1557    Subjective  Pt states falling 2x last week. Came home last Tuesday morning from [place]. Pt was unpacking her suitcase, pushed it forward and fell forward onto her arms and both knees onto the floor. Did not have far to go because she was bent over.  Last Wednesday, pt was in the back yard with a 93 year old and pt walked across a water slide in the back yard and fell. R knee hurts currently and her L knee pops.  Could not get up.  1/10 L knee, 3/10 R knee currently.        Pertinent History  Pt states falling onto her L knee around December 27/28, 2018. Pt states  she has bad balance. Pt stepped off the curb and fell. Pt landed onto her hands and L knee.  Pt fractured her L patella during the fall. Pt also states that she also had a tear in her R rotator cuff prior to her fall. L shoulder hurts but does not know if she hurt it during the fall. Raising her arms up bothers her. L knee still bothers her.   Pt was in a knee brace L LE for 2.5 months after the fall.  Pt states that her doctor thought it was healing around March 2019. Had home health PT until towards the end of March.  Going back to [facility] to see her knee surgeon at the last week of May, 2019.  Pt also states that she has memory difficulties due to cirrhosis.  L knee bothers her more than her shoulders.  L knee feels like it tingles or stings when something touches it.  Uses a SPC for longer distance walking since after the fall. [facility] again after returning home from her doctor's visit in March.  Pt was gently pushing her  great granddaughter on a swing. No other falls for the last 6 months.   Pt states having a fear of falling.   R shoulder bothers her the most compared to L. Pt is R hand dominant.     Patient Stated Goals  Pick up her grandchildren with less pain. Be able to walk better out in the yard. Improve Balance    Currently in Pain?  Yes    Pain Score  3  R knee    Pain Onset  More than a month ago                               PT Education - 02/24/18 2026    Education provided  Yes    Education Details  ther-ex, HEP    Person(s) Educated  Patient    Methods  Explanation;Demonstration;Tactile cues;Verbal cues;Handout    Comprehension  Returned demonstration;Verbalized understanding         Objectives  No latex band allergies   Therapeutic exercise  Pt was recommended to let her MD know about her falls.   Seated LAQ L knee 5x10 seconds, then 3x10 seconds, then 3 x 5 seconds   L knee pain.   Seated hip adduction ball and glute squeeze 10x5 seconds for  2 sets   Seated clamshells, hips less than 90 degress flexion 1x resisting blue band   bilatearl hip discomfort   Seated manually resisted clamshell isometrics 5 seconds. Bilateral hip discomfort  TTP bilateral hip flexor muscles   Seated glute max squeeze 10x5 seconds for 2 sets. Decreased hip discomfort.     Nustep seat 9, arms 9. L hip pain.     Try standing forward and backward weight shift and balance related activities next visit if appropriate.     Improved exercise technique, movement at target joints, use of target muscles after mod verbal, visual, tactile cues.    Manual therapy  Seated STM to L vastus lateralis to decrease tension   Worked on L quadriceps muscle use, and glute strengthening to help promote ability to support herself when performing standing tasks and decrease fall risk.  Difficulty with exercises today secondary to bilateral knee pain from the falls and bilateral hip pain.  Pt was recommended to let her MD know about the falls to make sure she did not injure anything. Pt verbalized understanding.          PT Long Term Goals - 01/27/18 1133      PT LONG TERM GOAL #1   Title  Patient will have a decrease in L knee pain to 4/10 at worst to promote ability to ambulate and perform transfers more comfortably.     Baseline  7/10 L knee pain at worst for the past 2 months (12/10/2017); 5/10 L knee pain at most for the past 7 days (01/27/2018)    Time  6    Period  Weeks    Status  On-going    Target Date  03/13/18      PT LONG TERM GOAL #2   Title  Patient will improve L LE strength by at least 1/2 MMT to promote ability to perform standing tasks.     Time  6    Period  Weeks    Status  Partially Met    Target Date  03/13/18      PT LONG TERM GOAL #3   Title  Patient will have  a decrease in R shoulder pain to 2/10 or less at worst, and L shoulder pain and 2/10 at worst to promote ability to raise her arms up.     Baseline  5/10 bilateral  shoulder pain at worst (12/10/2017); 5/10 R shoulder pain, 3/10 L shoulder pain at most for the past 7 days (01/27/2018)    Time  6    Period  Weeks    Status  On-going    Target Date  03/13/18      PT LONG TERM GOAL #4   Title  Patient will improve bilateral shoulder ER strength to promote ability to raise her arms up more comfortably.     Time  6    Period  Weeks    Status  On-going    Target Date  03/13/18      PT LONG TERM GOAL #5   Title  Pt will be able to perform TUG in 11 seconds or less safely to promote balance.     Baseline  9.67 seconds average without AD but unsteady, CGA to SBA (12/10/2017); 9.92 seconds average without AD, more steady compared to eval (01/27/2018)    Time  6    Period  Weeks    Status  On-going    Target Date  03/13/18            Plan - 02/24/18 2025    Clinical Impression Statement  Worked on L quadriceps muscle use, and glute strengthening to help promote ability to support herself when performing standing tasks and decrease fall risk.  Difficulty with exercises today secondary to bilateral knee pain from the falls and bilateral hip pain.  Pt was recommended to let her MD know about the falls to make sure she did not injure anything. Pt verbalized understanding.     Rehab Potential  Fair    Clinical Impairments Affecting Rehab Potential  Decreased balance, multiple areas of pain (R and L shoulder, L knee), decreased balance, difficulty raising both arms, weakness, difficulty walking, difficulty getting into and out of bed, the car, difficulty negotiating stairs    PT Frequency  2x / week    PT Duration  6 weeks    PT Treatment/Interventions  Therapeutic activities;Therapeutic exercise;Neuromuscular re-education;Patient/family education;Manual techniques;Dry needling;Aquatic Therapy;Electrical Stimulation;Iontophoresis 73m/ml Dexamethasone;Gait training    PT Next Visit Plan  glute med and max strengthening, scapular and ER muscle strengthening, manual  techniques, modalities PRN    Consulted and Agree with Plan of Care  Patient       Patient will benefit from skilled therapeutic intervention in order to improve the following deficits and impairments:  Pain, Postural dysfunction, Improper body mechanics, Decreased strength, Difficulty walking, Decreased range of motion, Decreased balance  Visit Diagnosis: Chronic pain of left knee  History of falling  Difficulty in walking, not elsewhere classified  Muscle weakness (generalized)     Problem List Patient Active Problem List   Diagnosis Date Noted  . Acute bilateral low back pain 06/18/2017  . Aortic heart murmur on examination 12/06/2015  . Urinary incontinence, urge 12/06/2015  . Gait instability 12/06/2015  . Shortness of breath 12/06/2015  . Memory loss 05/03/2015  . Allergic rhinitis 05/02/2015  . Blood glucose elevated 05/02/2015  . HLD (hyperlipidemia) 05/02/2015  . Essential hypertension 05/02/2015  . Adult hypothyroidism 05/02/2015  . Malaise and fatigue 05/02/2015  . Mild mitral regurgitation by prior echocardiogram 05/02/2015  . Obesity (BMI 35.0-39.9 without comorbidity) 05/02/2015  . Osteoarthritis, multiple sites 05/02/2015  .  Anxiety 01/27/2015  . Liver cirrhosis secondary to NASH (nonalcoholic steatohepatitis) ([facility]) 01/12/2015  . Hammer toe 11/18/2014  . Hallux rigidus of right foot 11/18/2014  . Hallux rigidus of left foot 11/18/2014  . Chronic right shoulder pain 10/26/2014  . Peripheral nerve disease 10/22/2012    Joneen Boers PT, DPT   02/24/2018, 8:33 PM  [facility] [address] [place], [address] Phone: [phone]   Fax:  [phone]  Name: VEDHA TERCERO MRN: [id number] Date of Birth: 01-06-1941

## 2018-02-24 NOTE — Patient Instructions (Signed)
  Sitting on a chair,    Squeeze your rear end muscles together for 5 seconds.    Repeat 10 times.   Perform at least 3 sets daily.

## 2018-02-26 ENCOUNTER — Ambulatory Visit: Payer: Medicare Other

## 2018-02-26 DIAGNOSIS — R262 Difficulty in walking, not elsewhere classified: Secondary | ICD-10-CM

## 2018-02-26 DIAGNOSIS — G8929 Other chronic pain: Secondary | ICD-10-CM

## 2018-02-26 DIAGNOSIS — M6281 Muscle weakness (generalized): Secondary | ICD-10-CM | POA: Diagnosis not present

## 2018-02-26 DIAGNOSIS — Z9181 History of falling: Secondary | ICD-10-CM | POA: Diagnosis not present

## 2018-02-26 DIAGNOSIS — M25511 Pain in right shoulder: Secondary | ICD-10-CM | POA: Diagnosis not present

## 2018-02-26 DIAGNOSIS — M25562 Pain in left knee: Secondary | ICD-10-CM | POA: Diagnosis not present

## 2018-02-26 NOTE — Patient Instructions (Signed)
Pt was recommended to go back to her gym pool with someone for safety if she feels like she is not going to fall secondary to pt having good results when working out at the pool prior to her trip to [place].

## 2018-02-26 NOTE — Therapy (Signed)
[facility] [address] [place], [address] Phone: [phone]   Fax:  [phone]  Physical Therapy Treatment  Patient Details  Name: Anita Bennett MRN: [id number] Date of Birth: [birth year] Referring Provider: Cornelius Moras, NP; Tacey Heap, PA   Encounter Date: 02/26/2018  PT End of Session - 02/26/18 1315    Visit Number  13    Number of Visits  29    Date for PT Re-Evaluation  03/13/18    Authorization Type  4    Authorization Time Period  of 10    PT Start Time  1315    PT Stop Time  1404    PT Time Calculation (min)  49 min    Equipment Utilized During Treatment  Gait belt    Activity Tolerance  Patient tolerated treatment well    Behavior During Therapy  WFL for tasks assessed/performed       Past Medical History:  Diagnosis Date  . Arrhythmia   . H/O knee surgery    [place] [facility] (replacement)  . Heart murmur   . History of kidney stones   . History of toe surgery    12/01/14 [facility]  . Hyperlipidemia   . Hypertension   . Thyroid disease     Past Surgical History:  Procedure Laterality Date  . arm surgery    . BACK SURGERY    . CHOLECYSTECTOMY    . TOE SURGERY    . TOTAL ABDOMINAL HYSTERECTOMY    . TOTAL KNEE ARTHROPLASTY Bilateral     There were no vitals filed for this visit.  Subjective Assessment - 02/26/18 1317    Subjective  L knee is ok. Its not good. L knee has been hurting some. Has not been to the doctor to get it checked but she is going to [facility] next week. L knee and thigh feels big and numb when she wakes up in the morning and stays that way pretty much.  Usually lies down on her side (both sides).  Has back pain all the time. Sitting usually feels better.      Pertinent History  Pt states falling onto her L knee around December 27/28, 2018. Pt states she has bad balance. Pt stepped off the curb and fell. Pt landed onto her hands and L knee.  Pt fractured her L  patella during the fall. Pt also states that she also had a tear in her R rotator cuff prior to her fall. L shoulder hurts but does not know if she hurt it during the fall. Raising her arms up bothers her. L knee still bothers her.   Pt was in a knee brace L LE for 2.5 months after the fall.  Pt states that her doctor thought it was healing around March 2019. Had home health PT until towards the end of March.  Going back to [facility] to see her knee surgeon at the last week of May, 2019.  Pt also states that she has memory difficulties due to cirrhosis.  L knee bothers her more than her shoulders.  L knee feels like it tingles or stings when something touches it.  Uses a SPC for longer distance walking since after the fall. [facility] again after returning home from her doctor's visit in March.  Pt was gently pushing her great granddaughter on a swing. No other falls for the last 6 months.   Pt states having a fear of falling.  R shoulder bothers her the most compared to L. Pt is R hand dominant.     Patient Stated Goals  Pick up her grandchildren with less pain. Be able to walk better out in the yard. Improve Balance    Currently in Pain?  Yes    Pain Score  3  L knee numbness    Pain Onset  More than a month ago                               PT Education - 02/26/18 1417    Education provided  Yes    Education Details  ther-ex    [employer]) Educated  Patient    Methods  Explanation;Demonstration;Tactile cues;Verbal cues    Comprehension  Returned demonstration;Verbalized understanding          PT Long Term Goals - 01/27/18 1133      PT LONG TERM GOAL #1   Title  Patient will have a decrease in L knee pain to 4/10 at worst to promote ability to ambulate and perform transfers more comfortably.     Baseline  7/10 L knee pain at worst for the past 2 months (12/10/2017); 5/10 L knee pain at most for the past 7 days (01/27/2018)    Time  6    Period  Weeks    Status  On-going     Target Date  03/13/18      PT LONG TERM GOAL #2   Title  Patient will improve L LE strength by at least 1/2 MMT to promote ability to perform standing tasks.     Time  6    Period  Weeks    Status  Partially Met    Target Date  03/13/18      PT LONG TERM GOAL #3   Title  Patient will have a decrease in R shoulder pain to 2/10 or less at worst, and L shoulder pain and 2/10 at worst to promote ability to raise her arms up.     Baseline  5/10 bilateral shoulder pain at worst (12/10/2017); 5/10 R shoulder pain, 3/10 L shoulder pain at most for the past 7 days (01/27/2018)    Time  6    Period  Weeks    Status  On-going    Target Date  03/13/18      PT LONG TERM GOAL #4   Title  Patient will improve bilateral shoulder ER strength to promote ability to raise her arms up more comfortably.     Time  6    Period  Weeks    Status  On-going    Target Date  03/13/18      PT LONG TERM GOAL #5   Title  Pt will be able to perform TUG in 11 seconds or less safely to promote balance.     Baseline  9.67 seconds average without AD but unsteady, CGA to SBA (12/10/2017); 9.92 seconds average without AD, more steady compared to eval (01/27/2018)    Time  6    Period  Weeks    Status  On-going    Target Date  03/13/18            Plan - 02/26/18 1417    Clinical Impression Statement  Worked on glute and L quadriceps strengthening to promote ability to support herself when standing and walking. Cues for pain-free level of effort. Possible decrease in L anterior thigh  numbness with seated L glute strengthening but pt unclear with effect of exercise. Pt will benefit from continued skilled physical therapy services to promote L LE strength, balance, and decrease knee pain.     Rehab Potential  Fair    Clinical Impairments Affecting Rehab Potential  Decreased balance, multiple areas of pain (R and L shoulder, L knee), decreased balance, difficulty raising both arms, weakness, difficulty walking, difficulty  getting into and out of bed, the car, difficulty negotiating stairs    PT Frequency  2x / week    PT Duration  6 weeks    PT Treatment/Interventions  Therapeutic activities;Therapeutic exercise;Neuromuscular re-education;Patient/family education;Manual techniques;Dry needling;Aquatic Therapy;Electrical Stimulation;Iontophoresis 70m/ml Dexamethasone;Gait training    PT Next Visit Plan  glute med and max strengthening, scapular and ER muscle strengthening, manual techniques, modalities PRN    Consulted and Agree with Plan of Care  Patient       Patient will benefit from skilled therapeutic intervention in order to improve the following deficits and impairments:  Pain, Postural dysfunction, Improper body mechanics, Decreased strength, Difficulty walking, Decreased range of motion, Decreased balance  Visit Diagnosis: Chronic pain of left knee  History of falling  Difficulty in walking, not elsewhere classified  Muscle weakness (generalized)     Problem List Patient Active Problem List   Diagnosis Date Noted  . Acute bilateral low back pain 06/18/2017  . Aortic heart murmur on examination 12/06/2015  . Urinary incontinence, urge 12/06/2015  . Gait instability 12/06/2015  . Shortness of breath 12/06/2015  . Memory loss 05/03/2015  . Allergic rhinitis 05/02/2015  . Blood glucose elevated 05/02/2015  . HLD (hyperlipidemia) 05/02/2015  . Essential hypertension 05/02/2015  . Adult hypothyroidism 05/02/2015  . Malaise and fatigue 05/02/2015  . Mild mitral regurgitation by prior echocardiogram 05/02/2015  . Obesity (BMI 35.0-39.9 without comorbidity) 05/02/2015  . Osteoarthritis, multiple sites 05/02/2015  . Anxiety 01/27/2015  . Liver cirrhosis secondary to NASH ([facility]onalcoholic steatohepatitis) [place] 01/12/2015  . Hammer toe 11/18/2014  . Hallux rigidus of right foot 11/18/2014  . Hallux rigidus of left foot 11/18/2014  . Chronic right shoulder pain 10/26/2014  . Peripheral nerve  disease 10/22/2012    Lakesia Dahle 02/26/2018, 4:22 PM  [facility] [address] [place] [address]Phone: [phone]  Fax:  [phone] Name: Anita STERBENZMRN: [id number] of Birth: [birth year]

## 2018-02-26 NOTE — Therapy (Signed)
[facility] [address] [place], [address] Phone: [phone]   Fax:  [phone]  Physical Therapy Treatment  Patient Details  Name: Anita Bennett MRN: [id number] Date of Birth: [birth year] Referring Provider: Cornelius Moras, NP; Tacey Heap, PA   Encounter Date: 02/26/2018  PT End of Session - 02/26/18 1315    Visit Number  13    Number of Visits  29    Date for PT Re-Evaluation  03/13/18    Authorization Type  4    Authorization Time Period  of 10    PT Start Time  1315    PT Stop Time  1404    PT Time Calculation (min)  49 min    Equipment Utilized During Treatment  Gait belt    Activity Tolerance  Patient tolerated treatment well    Behavior During Therapy  WFL for tasks assessed/performed       Past Medical History:  Diagnosis Date  . Arrhythmia   . H/O knee surgery    [place] [facility] (replacement)  . Heart murmur   . History of kidney stones   . History of toe surgery    12/01/14 [facility]  . Hyperlipidemia   . Hypertension   . Thyroid disease     Past Surgical History:  Procedure Laterality Date  . arm surgery    . BACK SURGERY    . CHOLECYSTECTOMY    . TOE SURGERY    . TOTAL ABDOMINAL HYSTERECTOMY    . TOTAL KNEE ARTHROPLASTY Bilateral     There were no vitals filed for this visit.  Subjective Assessment - 02/26/18 1317    Subjective  L knee is ok. Its not good. L knee has been hurting some. Has not been to the doctor to get it checked but she is going to [facility] next week. L knee and thigh feels big and numb when she wakes up in the morning and stays that way pretty much.  Usually lies down on her side (both sides).  Has back pain all the time. Sitting usually feels better.      Pertinent History  Pt states falling onto her L knee around December 27/28, 2018. Pt states she has bad balance. Pt stepped off the curb and fell. Pt landed onto her hands and L knee.  Pt fractured her L  patella during the fall. Pt also states that she also had a tear in her R rotator cuff prior to her fall. L shoulder hurts but does not know if she hurt it during the fall. Raising her arms up bothers her. L knee still bothers her.   Pt was in a knee brace L LE for 2.5 months after the fall.  Pt states that her doctor thought it was healing around March 2019. Had home health PT until towards the end of March.  Going back to [facility] to see her knee surgeon at the last week of May, 2019.  Pt also states that she has memory difficulties due to cirrhosis.  L knee bothers her more than her shoulders.  L knee feels like it tingles or stings when something touches it.  Uses a SPC for longer distance walking since after the fall. [facility] again after returning home from her doctor's visit in March.  Pt was gently pushing her great granddaughter on a swing. No other falls for the last 6 months.   Pt states having a fear of falling.  R shoulder bothers her the most compared to L. Pt is R hand dominant.     Patient Stated Goals  Pick up her grandchildren with less pain. Be able to walk better out in the yard. Improve Balance    Currently in Pain?  Yes    Pain Score  3  L knee numbness    Pain Onset  More than a month ago                               PT Education - 02/26/18 1417    Education provided  Yes    Education Details  ther-ex    [employer]) Educated  Patient    Methods  Explanation;Demonstration;Tactile cues;Verbal cues    Comprehension  Returned demonstration;Verbalized understanding         Objectives  No latex band allergies   Therapeutic exercise   Seated L hip extension isometrics to promote glute max strength 5x5 seconds for 4 sets  Decreased L anterior thigh numbness  Seated R hip extension isometrics 5x5 seconds for 3 sets   Standing glute max squeeze 5x5 seconds for 2 sets  No change in L anterior thigh numbness  Standing gentle forward weight shift with  R UE assist 5 seconds. L knee pain.   L lateral weight shifting to pain-free range with bilateral UE assist 10x2 with 3 second holds to promote quadriceps muscle use.   Seated isometric L knee extension 10x5 seconds with PT manual resistance for 3 sets   Improved exercise technique, movement at target joints, use of target muscles after mod verbal, visual, tactile cues.    L anterior thigh numbness increases in standing   Manual therapy   Seated STM to L low back paraspinal muscles to decrease tension    Worked on glute and L quadriceps strengthening to promote ability to support herself when standing and walking. Cues for pain-free level of effort. Possible decrease in L anterior thigh numbness with seated L glute strengthening but pt unclear with effect of exercise. Pt will benefit from continued skilled physical therapy services to promote L LE strength, balance, and decrease knee pain.    PT Long Term Goals - 01/27/18 1133      PT LONG TERM GOAL #1   Title  Patient will have a decrease in L knee pain to 4/10 at worst to promote ability to ambulate and perform transfers more comfortably.     Baseline  7/10 L knee pain at worst for the past 2 months (12/10/2017); 5/10 L knee pain at most for the past 7 days (01/27/2018)    Time  6    Period  Weeks    Status  On-going    Target Date  03/13/18      PT LONG TERM GOAL #2   Title  Patient will improve L LE strength by at least 1/2 MMT to promote ability to perform standing tasks.     Time  6    Period  Weeks    Status  Partially Met    Target Date  03/13/18      PT LONG TERM GOAL #3   Title  Patient will have a decrease in R shoulder pain to 2/10 or less at worst, and L shoulder pain and 2/10 at worst to promote ability to raise her arms up.     Baseline  5/10 bilateral shoulder pain at worst (12/10/2017); 5/10 R shoulder  pain, 3/10 L shoulder pain at most for the past 7 days (01/27/2018)    Time  6    Period  Weeks    Status   On-going    Target Date  03/13/18      PT LONG TERM GOAL #4   Title  Patient will improve bilateral shoulder ER strength to promote ability to raise her arms up more comfortably.     Time  6    Period  Weeks    Status  On-going    Target Date  03/13/18      PT LONG TERM GOAL #5   Title  Pt will be able to perform TUG in 11 seconds or less safely to promote balance.     Baseline  9.67 seconds average without AD but unsteady, CGA to SBA (12/10/2017); 9.92 seconds average without AD, more steady compared to eval (01/27/2018)    Time  6    Period  Weeks    Status  On-going    Target Date  03/13/18            Plan - 02/26/18 1417    Clinical Impression Statement  Worked on glute and L quadriceps strengthening to promote ability to support herself when standing and walking. Cues for pain-free level of effort. Possible decrease in L anterior thigh numbness with seated L glute strengthening but pt unclear with effect of exercise. Pt will benefit from continued skilled physical therapy services to promote L LE strength, balance, and decrease knee pain.     Rehab Potential  Fair    Clinical Impairments Affecting Rehab Potential  Decreased balance, multiple areas of pain (R and L shoulder, L knee), decreased balance, difficulty raising both arms, weakness, difficulty walking, difficulty getting into and out of bed, the car, difficulty negotiating stairs    PT Frequency  2x / week    PT Duration  6 weeks    PT Treatment/Interventions  Therapeutic activities;Therapeutic exercise;Neuromuscular re-education;Patient/family education;Manual techniques;Dry needling;Aquatic Therapy;Electrical Stimulation;Iontophoresis 59m/ml Dexamethasone;Gait training    PT Next Visit Plan  glute med and max strengthening, scapular and ER muscle strengthening, manual techniques, modalities PRN    Consulted and Agree with Plan of Care  Patient       Patient will benefit from skilled therapeutic intervention in order  to improve the following deficits and impairments:  Pain, Postural dysfunction, Improper body mechanics, Decreased strength, Difficulty walking, Decreased range of motion, Decreased balance  Visit Diagnosis: Chronic pain of left knee  History of falling  Difficulty in walking, not elsewhere classified  Muscle weakness (generalized)     Problem List Patient Active Problem List   Diagnosis Date Noted  . Acute bilateral low back pain 06/18/2017  . Aortic heart murmur on examination 12/06/2015  . Urinary incontinence, urge 12/06/2015  . Gait instability 12/06/2015  . Shortness of breath 12/06/2015  . Memory loss 05/03/2015  . Allergic rhinitis 05/02/2015  . Blood glucose elevated 05/02/2015  . HLD (hyperlipidemia) 05/02/2015  . Essential hypertension 05/02/2015  . Adult hypothyroidism 05/02/2015  . Malaise and fatigue 05/02/2015  . Mild mitral regurgitation by prior echocardiogram 05/02/2015  . Obesity (BMI 35.0-39.9 without comorbidity) 05/02/2015  . Osteoarthritis, multiple sites 05/02/2015  . Anxiety 01/27/2015  . Liver cirrhosis secondary to NASH (nonalcoholic steatohepatitis) ([facility] 01/12/2015  . Hammer toe 11/18/2014  . Hallux rigidus of right foot 11/18/2014  . Hallux rigidus of left foot 11/18/2014  . Chronic right shoulder pain 10/26/2014  . Peripheral nerve disease  10/22/2012    Joneen Boers PT, DPT   02/26/2018, 2:24 PM  [facility] [address], [place], [address] Phone: [phone]   Fax:  [phone]  Name: Anita Bennett MRN: [id number] Date of Birth: 13-Oct-1940

## 2018-03-03 ENCOUNTER — Ambulatory Visit: Payer: Medicare Other

## 2018-03-03 DIAGNOSIS — G8929 Other chronic pain: Secondary | ICD-10-CM

## 2018-03-03 DIAGNOSIS — M6281 Muscle weakness (generalized): Secondary | ICD-10-CM

## 2018-03-03 DIAGNOSIS — M25562 Pain in left knee: Secondary | ICD-10-CM | POA: Diagnosis not present

## 2018-03-03 DIAGNOSIS — R262 Difficulty in walking, not elsewhere classified: Secondary | ICD-10-CM

## 2018-03-03 DIAGNOSIS — M25511 Pain in right shoulder: Secondary | ICD-10-CM | POA: Diagnosis not present

## 2018-03-03 DIAGNOSIS — Z9181 History of falling: Secondary | ICD-10-CM | POA: Diagnosis not present

## 2018-03-03 NOTE — Therapy (Signed)
[place] [facility] [address] [place], [address] Phone: [phone]   Fax:  [phone]  Physical Therapy Treatment  Patient Details  Name: Anita Bennett MRN: [id number] Date of Birth: [birth year] Referring Provider: Cornelius Moras, NP; Tacey Heap, PA   Encounter Date: 03/03/2018  PT End of Session - 03/03/18 0911    Visit Number  14    Number of Visits  29    Date for PT Re-Evaluation  03/13/18    Authorization Type  5    Authorization Time Period  of 10    PT Start Time  0909    PT Stop Time  0953    PT Time Calculation (min)  44 min    Equipment Utilized During Treatment  Gait belt    Activity Tolerance  Patient tolerated treatment well    Behavior During Therapy  [facility] for tasks assessed/performed       Past Medical History:  Diagnosis Date  . Arrhythmia   . H/O knee surgery    [place] [facility] (replacement)  . Heart murmur   . History of kidney stones   . History of toe surgery    12/01/14 [facility]  . Hyperlipidemia   . Hypertension   . Thyroid disease     Past Surgical History:  Procedure Laterality Date  . arm surgery    . BACK SURGERY    . CHOLECYSTECTOMY    . TOE SURGERY    . TOTAL ABDOMINAL HYSTERECTOMY    . TOTAL KNEE ARTHROPLASTY Bilateral     There were no vitals filed for this visit.  Subjective Assessment - 03/03/18 0912    Subjective  Can hardly turn in bed because it hurts her R hip bad. Has a hard time walking due to R hip pain. Has a hard time lifting up her R thigh when lying down. Wonders if she hurt that when she fell last week.  Has and endoscope appointment with [facility] Wednesday. Has to rest Thursday.  L knee is about the same.  A little L knee pain, no more than usual.  5/10 R hip pain.     Pertinent History  Pt states falling onto her L knee around December 27/28, 2018. Pt states she has bad balance. Pt stepped off the curb and fell. Pt landed onto her hands and L knee.  Pt  fractured her L patella during the fall. Pt also states that she also had a tear in her R rotator cuff prior to her fall. L shoulder hurts but does not know if she hurt it during the fall. Raising her arms up bothers her. L knee still bothers her.   Pt was in a knee brace L LE for 2.5 months after the fall.  Pt states that her doctor thought it was healing around March 2019. Had home health PT until towards the end of March.  Going back to [facility] to see her knee surgeon at the last week of May, 2019.  Pt also states that she has memory difficulties due to cirrhosis.  L knee bothers her more than her shoulders.  L knee feels like it tingles or stings when something touches it.  Uses a SPC for longer distance walking since after the fall. [place] again after returning home from her doctor's visit in March.  Pt was gently pushing her great granddaughter on a swing. No other falls for the last 6 months.   Pt states having  a fear of falling.   R shoulder bothers her the most compared to L. Pt is R hand dominant.     Patient Stated Goals  Pick up her grandchildren with less pain. Be able to walk better out in the yard. Improve Balance    Currently in Pain?  Yes    Pain Score  5    R hip pain.    Pain Onset  More than a month ago                               PT Education - 03/03/18 1010    Education provided  Yes    Education Details  ther-ex    [employer]) Educated  Patient    Methods  Explanation;Demonstration;Tactile cues;Verbal cues    Comprehension  Returned demonstration;Verbalized understanding         Objectives  No latex band allergies  Therapeutic exercise  Pt was recommended to get her R LE checked out secondary to hx of fall. Pt verbalized understanding.   Seated R hip extension isometrics 10x5 seconds Seated R hip flexion isometrics 8x5 seconds R hip discomfort, eases with rest  Seated L hip extension isometrics 10x5 seconds  Seated L quadriceps  isometrics at 80 degrees knee flexion 10x5 seconds, pain free level of effort for 3 sets to promote strengthening  Seated L knee extension, pain free range 8x5 seconds. R LE pain.   Seated:  Slouching: no increase R hip pain  Sitting up straight: increases R hip pain  Seated bilateral shoulder extension isometrics, hands on thighs 5x5 seconds for 2 sets. Increased R hip and anterior thigh pain. Eases with rest  Seated transversus abdominis contraction 5x5 seconds   Improved exercise technique, movement at target joints, use of target muscles after mod verbal, visual, tactile cues.     Manual therapy  Seated STM R lumbar paraspinal muscle to decrease tension. Decreased R hip and R LE pain.    Worked on decreasing R hip pain secondary to R hip pain presenting a limiting factor for L LE strengthening. Decreased R hip pain and improved ease with walking following treatment to decrease R lumbar paraspinal muscle tension, suggesting low back involvement. Continued working on gentle R quadriceps strengthening in a pain free level to promote ability to support herself with her L LE in standing and walking. Pt tolerated session well without aggravation of L LE pain.      PT Long Term Goals - 01/27/18 1133      PT LONG TERM GOAL #1   Title  Patient will have a decrease in L knee pain to 4/10 at worst to promote ability to ambulate and perform transfers more comfortably.     Baseline  7/10 L knee pain at worst for the past 2 months (12/10/2017); 5/10 L knee pain at most for the past 7 days (01/27/2018)    Time  6    Period  Weeks    Status  On-going    Target Date  03/13/18      PT LONG TERM GOAL #2   Title  Patient will improve L LE strength by at least 1/2 MMT to promote ability to perform standing tasks.     Time  6    Period  Weeks    Status  Partially Met    Target Date  03/13/18      PT LONG TERM GOAL #3   Title  Patient will have a decrease in R shoulder pain to 2/10 or less at  worst, and L shoulder pain and 2/10 at worst to promote ability to raise her arms up.     Baseline  5/10 bilateral shoulder pain at worst (12/10/2017); 5/10 R shoulder pain, 3/10 L shoulder pain at most for the past 7 days (01/27/2018)    Time  6    Period  Weeks    Status  On-going    Target Date  03/13/18      PT LONG TERM GOAL #4   Title  Patient will improve bilateral shoulder ER strength to promote ability to raise her arms up more comfortably.     Time  6    Period  Weeks    Status  On-going    Target Date  03/13/18      PT LONG TERM GOAL #5   Title  Pt will be able to perform TUG in 11 seconds or less safely to promote balance.     Baseline  9.67 seconds average without AD but unsteady, CGA to SBA (12/10/2017); 9.92 seconds average without AD, more steady compared to eval (01/27/2018)    Time  6    Period  Weeks    Status  On-going    Target Date  03/13/18            Plan - 03/03/18 1012    Clinical Impression Statement  Worked on decreasing R hip pain secondary to R hip pain presenting a limiting factor for L LE strengthening. Decreased R hip pain and improved ease with walking following treatment to decrease R lumbar paraspinal muscle tension, suggesting low back involvement. Continued working on gentle R quadriceps strengthening in a pain free level to promote ability to support herself with her L LE in standing and walking. Pt tolerated session well without aggravation of L LE pain.     Rehab Potential  Fair    Clinical Impairments Affecting Rehab Potential  Decreased balance, multiple areas of pain (R and L shoulder, L knee), decreased balance, difficulty raising both arms, weakness, difficulty walking, difficulty getting into and out of bed, the car, difficulty negotiating stairs    PT Frequency  2x / week    PT Duration  6 weeks    PT Treatment/Interventions  Therapeutic activities;Therapeutic exercise;Neuromuscular re-education;Patient/family education;Manual  techniques;Dry needling;Aquatic Therapy;Electrical Stimulation;Iontophoresis 42m/ml Dexamethasone;Gait training    PT Next Visit Plan  glute med and max strengthening, scapular and ER muscle strengthening, manual techniques, modalities PRN    Consulted and Agree with Plan of Care  Patient       Patient will benefit from skilled therapeutic intervention in order to improve the following deficits and impairments:  Pain, Postural dysfunction, Improper body mechanics, Decreased strength, Difficulty walking, Decreased range of motion, Decreased balance  Visit Diagnosis: Chronic pain of left knee  Difficulty in walking, not elsewhere classified  Muscle weakness (generalized)     Problem List Patient Active Problem List   Diagnosis Date Noted  . Acute bilateral low back pain 06/18/2017  . Aortic heart murmur on examination 12/06/2015  . Urinary incontinence, urge 12/06/2015  . Gait instability 12/06/2015  . Shortness of breath 12/06/2015  . Memory loss 05/03/2015  . Allergic rhinitis 05/02/2015  . Blood glucose elevated 05/02/2015  . HLD (hyperlipidemia) 05/02/2015  . Essential hypertension 05/02/2015  . Adult hypothyroidism 05/02/2015  . Malaise and fatigue 05/02/2015  . Mild mitral regurgitation by prior echocardiogram 05/02/2015  . Obesity (  BMI 35.0-39.9 without comorbidity) 05/02/2015  . Osteoarthritis, multiple sites 05/02/2015  . Anxiety 01/27/2015  . Liver cirrhosis secondary to NASH (nonalcoholic steatohepatitis) ([place]) 01/12/2015  . Hammer toe 11/18/2014  . Hallux rigidus of right foot 11/18/2014  . Hallux rigidus of left foot 11/18/2014  . Chronic right shoulder pain 10/26/2014  . Peripheral nerve disease 10/22/2012    Joneen Boers PT, DPT   03/03/2018, 10:20 AM  [place] [facility] [address] [place], [address] Phone: [phone]   Fax:  [phone]  Name: ONIKA GUDIEL MRN: [id number] Date  of Birth: Jun 23, 1941

## 2018-03-03 NOTE — Patient Instructions (Signed)
Pt was recommended to gently pull her naval in throughout the day to activate her transversus abdominis muscle and help decrease lumbar paraspinal muscle tension. Pt demonstrated and verbalized understanding.

## 2018-03-04 ENCOUNTER — Ambulatory Visit: Payer: Medicare Other

## 2018-03-05 DIAGNOSIS — I10 Essential (primary) hypertension: Secondary | ICD-10-CM | POA: Diagnosis not present

## 2018-03-05 DIAGNOSIS — Z79899 Other long term (current) drug therapy: Secondary | ICD-10-CM | POA: Diagnosis not present

## 2018-03-05 DIAGNOSIS — M159 Polyosteoarthritis, unspecified: Secondary | ICD-10-CM | POA: Diagnosis not present

## 2018-03-05 DIAGNOSIS — F419 Anxiety disorder, unspecified: Secondary | ICD-10-CM | POA: Diagnosis not present

## 2018-03-05 DIAGNOSIS — K7581 Nonalcoholic steatohepatitis (NASH): Secondary | ICD-10-CM | POA: Diagnosis not present

## 2018-03-05 DIAGNOSIS — I8500 Esophageal varices without bleeding: Secondary | ICD-10-CM | POA: Diagnosis not present

## 2018-03-05 DIAGNOSIS — K7460 Unspecified cirrhosis of liver: Secondary | ICD-10-CM | POA: Diagnosis not present

## 2018-03-05 DIAGNOSIS — F418 Other specified anxiety disorders: Secondary | ICD-10-CM | POA: Diagnosis not present

## 2018-03-05 DIAGNOSIS — K3189 Other diseases of stomach and duodenum: Secondary | ICD-10-CM | POA: Diagnosis not present

## 2018-03-05 DIAGNOSIS — K766 Portal hypertension: Secondary | ICD-10-CM | POA: Diagnosis not present

## 2018-03-05 DIAGNOSIS — K319 Disease of stomach and duodenum, unspecified: Secondary | ICD-10-CM | POA: Diagnosis not present

## 2018-03-05 DIAGNOSIS — Z8614 Personal history of Methicillin resistant Staphylococcus aureus infection: Secondary | ICD-10-CM | POA: Diagnosis not present

## 2018-03-05 DIAGNOSIS — F329 Major depressive disorder, single episode, unspecified: Secondary | ICD-10-CM | POA: Diagnosis not present

## 2018-03-05 DIAGNOSIS — I8510 Secondary esophageal varices without bleeding: Secondary | ICD-10-CM | POA: Diagnosis not present

## 2018-03-05 DIAGNOSIS — Z888 Allergy status to other drugs, medicaments and biological substances status: Secondary | ICD-10-CM | POA: Diagnosis not present

## 2018-03-05 DIAGNOSIS — K449 Diaphragmatic hernia without obstruction or gangrene: Secondary | ICD-10-CM | POA: Diagnosis not present

## 2018-03-10 ENCOUNTER — Ambulatory Visit: Payer: Medicare Other

## 2018-03-12 ENCOUNTER — Ambulatory Visit: Payer: Medicare Other

## 2018-03-17 ENCOUNTER — Ambulatory Visit: Payer: Medicare Other

## 2018-03-17 DIAGNOSIS — M25511 Pain in right shoulder: Secondary | ICD-10-CM | POA: Diagnosis not present

## 2018-03-17 DIAGNOSIS — Z9181 History of falling: Secondary | ICD-10-CM

## 2018-03-17 DIAGNOSIS — G8929 Other chronic pain: Secondary | ICD-10-CM

## 2018-03-17 DIAGNOSIS — M25512 Pain in left shoulder: Secondary | ICD-10-CM

## 2018-03-17 DIAGNOSIS — M6281 Muscle weakness (generalized): Secondary | ICD-10-CM

## 2018-03-17 DIAGNOSIS — R262 Difficulty in walking, not elsewhere classified: Secondary | ICD-10-CM

## 2018-03-17 DIAGNOSIS — M25572 Pain in left ankle and joints of left foot: Secondary | ICD-10-CM

## 2018-03-17 DIAGNOSIS — M25562 Pain in left knee: Secondary | ICD-10-CM | POA: Diagnosis not present

## 2018-03-17 NOTE — Therapy (Signed)
[place] [facility] [address] [place], [address] Phone: [phone]   Fax:  [phone]  Physical Therapy Treatment  Patient Details  Name: Anita Bennett MRN: [id number] Date of Birth: November 13, 1940 Referring Provider: Cornelius Moras, NP; Tacey Heap, PA   Encounter Date: 03/17/2018  PT End of Session - 03/17/18 0900    Visit Number  15    Number of Visits  39    Date for PT Re-Evaluation  04/24/18    Authorization Type  6    Authorization Time Period  of 10    PT Start Time  0900    PT Stop Time  0948    PT Time Calculation (min)  48 min    Equipment Utilized During Treatment  Gait belt    Activity Tolerance  Patient tolerated treatment well    Behavior During Therapy  [facility] for tasks assessed/performed       Past Medical History:  Diagnosis Date  . Arrhythmia   . H/O knee surgery    [place] [facility] (replacement)  . Heart murmur   . History of kidney stones   . History of toe surgery    12/01/14 [facility]  . Hyperlipidemia   . Hypertension   . Thyroid disease     Past Surgical History:  Procedure Laterality Date  . arm surgery    . BACK SURGERY    . CHOLECYSTECTOMY    . TOE SURGERY    . TOTAL ABDOMINAL HYSTERECTOMY    . TOTAL KNEE ARTHROPLASTY Bilateral     There were no vitals filed for this visit.  Subjective Assessment - 03/17/18 0902    Subjective  L knee is doing better. Feels medial L thigh pain. L LE feels weak.  Walked yesterday.  3-4/10 L knee pain at worst, for the past 7 days, 5/10 L medial thigh pain at most for the past 7 days.  The L anterior thigh and L knee numbness bothers her the most.   Shoulders are pretty good.  6/10 R shoulder, 4/10 L shoulder pain at most for the past 7 days.  Low back bothers her if she stands and fixes her bed as well as standing and washing dishes. Pt states that she is walking more. Still has to hold onto the wall especially since she has a hard time  picking up her feet at times. Getting into the car is still hard. R hip still bothers her. Did not get her R LE checked out yet.       Pertinent History  Pt states falling onto her L knee around December 27/28, 2018. Pt states she has bad balance. Pt stepped off the curb and fell. Pt landed onto her hands and L knee.  Pt fractured her L patella during the fall. Pt also states that she also had a tear in her R rotator cuff prior to her fall. L shoulder hurts but does not know if she hurt it during the fall. Raising her arms up bothers her. L knee still bothers her.   Pt was in a knee brace L LE for 2.5 months after the fall.  Pt states that her doctor thought it was healing around March 2019. Had home health PT until towards the end of March.  Going back to [facility] to see her knee surgeon at the last week of May, 2019.  Pt also states that she has memory difficulties due to cirrhosis.  L knee  bothers her more than her shoulders.  L knee feels like it tingles or stings when something touches it.  Uses a SPC for longer distance walking since after the fall. [place] again after returning home from her doctor's visit in March.  Pt was gently pushing her great granddaughter on a swing. No other falls for the last 6 months.   Pt states having a fear of falling.   R shoulder bothers her the most compared to L. Pt is R hand dominant.     Patient Stated Goals  Pick up her grandchildren with less pain. Be able to walk better out in the yard. Improve Balance    Currently in Pain?  Yes    Pain Onset  More than a month ago         [facility] PT Assessment - 03/17/18 0859      Observation/Other Assessments   Observations  TUG: 10.3 seconds on average, no use of AD. More steady compared to eval observed.       Strength   Right Shoulder External Rotation  4+/5    Left Shoulder External Rotation  4+/5    Right Hip ABduction  5/5   seated manually resisted clamshell isometrics   Left Hip Flexion  4+/5    Left Hip Extension   4+/5   seated hip ext isometrics   Left Hip ABduction  5/5   seated manually resisted clamshell isometrics   Left Knee Flexion  5/5    Left Knee Extension  5/5   patellar pain when returning to neutral                          PT Education - 03/17/18 0933    Education provided  Yes    Education Details  ther-ex, progress, current status with PT towards goals.     Person(s) Educated  Patient    Methods  Explanation;Demonstration;Tactile cues;Verbal cues    Comprehension  Returned demonstration;Verbalized understanding         Objectives  No latex band allergies    Next MD appointment is 04/03/2018.   Therapeutic exercise  Sitting with slight R trunk side bend. Decreased L medial thigh pain  Sitting with slight trunk flexion to the R. Slight decrease in L medial thigh pain.   Sitting with upright posture. Decreased L medial thigh pain, slight low back discomfort.   Seated manually resisted L hip flexion, extension, clamshell isometric, knee flexion, knee extension 1x each. Reviewed progress/current status with strength with pt.   Blood pressure taken, mechanically, L arm sitting, normal cuff: 159/67, HR 60 BPM  Standing up from a chair, walking 10 ft forward, then returning 10 ft, then sitting back onto chair 3x   No AD 11 seconds, 11 seconds, 9 seconds. 10.3 seconds average  Manually resisted shoulder ER 1x each UE  Reviewed progress/current status with PT towards goals    Improved exercise technique, movement at target joints, use of target muscles after min to mod verbal, visual, tactile cues.      Manual therapy   Seated STM R lumbar paraspinal muscle to decrease tension and help decrease R hip pain.     Pt demonstrates overall improved L LE strength, decreased L knee pain, impoved bilateral shoulder ER strength, and more steady when performing the TUG without her AD since initial evaluation. Slight increase in TUG time  without using her AD since last measured but still under the 12 seconds  cuttof score. Due to her recent 2 falls earlier in August after returning from her [place] trip, in conjunction with L knee pain, patient will benefit from continued skilled physical therapy services to improve balance, decrease fall risk, continue to improve LE strength, and decrease pain.       PT Long Term Goals - 03/17/18 0951      PT LONG TERM GOAL #1   Title  Patient will have a decrease in L knee pain to 4/10 at worst to promote ability to ambulate and perform transfers more comfortably.     Baseline  7/10 L knee pain at worst for the past 2 months (12/10/2017); 5/10 L knee pain at most for the past 7 days (01/27/2018); 4/10 L knee pain at worst for the past 7 days, 5/10 L medial thigh pain at most for the past 7 days (03/17/2018)    Time  5    Period  Weeks    Status  On-going    Target Date  04/24/18      PT LONG TERM GOAL #2   Title  Patient will improve L LE strength by at least 1/2 MMT to promote ability to perform standing tasks.     Time  6    Period  Weeks    Status  Achieved    Target Date  03/13/18      PT LONG TERM GOAL #3   Title  Patient will have a decrease in R shoulder pain to 2/10 or less at worst, and L shoulder pain and 2/10 at worst to promote ability to raise her arms up.     Baseline  5/10 bilateral shoulder pain at worst (12/10/2017); 5/10 R shoulder pain, 3/10 L shoulder pain at most for the past 7 days (01/27/2018); 6/10 R shoulder, 4/10 L shoulder pain at worst for the past 7 days (03/17/2018)    Time  5    Period  Weeks    Status  On-going    Target Date  04/24/18      PT LONG TERM GOAL #4   Title  Patient will improve bilateral shoulder ER strength to promote ability to raise her arms up more comfortably.     Time  6    Period  Weeks    Status  Achieved      PT LONG TERM GOAL #5   Title  Pt will be able to perform TUG in 11 seconds or less safely to promote balance.     Baseline   9.67 seconds average without AD but unsteady, CGA to SBA (12/10/2017); 9.92 seconds average without AD, more steady compared to eval (01/27/2018); 10.3 seconds on average without AD, more steady compared to eval observed (03/17/2018)    Time  6    Period  Weeks    Status  Achieved            Plan - 03/17/18 0856    Clinical Impression Statement  Pt demonstrates overall improved L LE strength, decreased L knee pain, impoved bilateral shoulder ER strength, and more steady when performing the TUG without her AD since initial evaluation. Slight increase in TUG time without using her AD since last measured but still under the 12 seconds cuttof score. Due to her recent 2 falls earlier in August after returning from her [place] trip, in conjunction with L knee pain, patient will benefit from continued skilled physical therapy services to improve balance, decrease fall risk, continue to improve LE strength, and  decrease pain.     History and Personal Factors relevant to plan of care:  Decreased balance, multiple areas of pain (R and L shoulder, L knee, L ankle, bilateral hip pain), decreased balance, difficulty raising both arms due to shoulder pain, weakness, difficulty walking, difficulty negotiating stairs, getting into and out of the car     Clinical Presentation  Stable    Clinical Presentation due to:  Pt making progress wiht PT towards goals.     Clinical Decision Making  Low    Rehab Potential  Fair    Clinical Impairments Affecting Rehab Potential  Decreased balance, multiple areas of pain (R and L shoulder, L knee), decreased balance, difficulty raising both arms, weakness, difficulty walking, difficulty getting into and out of bed, the car, difficulty negotiating stairs    PT Frequency  2x / week    PT Duration  Other (comment)   5 weeks   PT Treatment/Interventions  Therapeutic activities;Therapeutic exercise;Neuromuscular re-education;Patient/family education;Manual techniques;Dry  needling;Aquatic Therapy;Electrical Stimulation;Iontophoresis 50m/ml Dexamethasone;Gait training    PT Next Visit Plan  glute med and max strengthening, scapular and ER muscle strengthening, manual techniques, modalities PRN    Consulted and Agree with Plan of Care  Patient       Patient will benefit from skilled therapeutic intervention in order to improve the following deficits and impairments:  Pain, Postural dysfunction, Improper body mechanics, Decreased strength, Difficulty walking, Decreased range of motion, Decreased balance  Visit Diagnosis: Chronic pain of left knee - Plan: PT plan of care cert/re-cert, PT plan of care cert/re-cert  Difficulty in walking, not elsewhere classified - Plan: PT plan of care cert/re-cert, PT plan of care cert/re-cert  Muscle weakness (generalized) - Plan: PT plan of care cert/re-cert, PT plan of care cert/re-cert  History of falling - Plan: PT plan of care cert/re-cert, PT plan of care cert/re-cert  Chronic right shoulder pain - Plan: PT plan of care cert/re-cert, PT plan of care cert/re-cert  Chronic left shoulder pain - Plan: PT plan of care cert/re-cert, PT plan of care cert/re-cert  Pain in left ankle and joints of left foot - Plan: PT plan of care cert/re-cert, PT plan of care cert/re-cert     Problem List Patient Active Problem List   Diagnosis Date Noted  . Acute bilateral low back pain 06/18/2017  . Aortic heart murmur on examination 12/06/2015  . Urinary incontinence, urge 12/06/2015  . Gait instability 12/06/2015  . Shortness of breath 12/06/2015  . Memory loss 05/03/2015  . Allergic rhinitis 05/02/2015  . Blood glucose elevated 05/02/2015  . HLD (hyperlipidemia) 05/02/2015  . Essential hypertension 05/02/2015  . Adult hypothyroidism 05/02/2015  . Malaise and fatigue 05/02/2015  . Mild mitral regurgitation by prior echocardiogram 05/02/2015  . Obesity (BMI 35.0-39.9 without comorbidity) 05/02/2015  . Osteoarthritis, multiple  sites 05/02/2015  . Anxiety 01/27/2015  . Liver cirrhosis secondary to NASH ([place]onalcoholic steatohepatitis[place] 01/12[place]  . Hammer toe 11/18/2014  . Hallux rigidus of right foot 11/18/2014  . Hallux rigidus of left foot 11/18/2014  . Chronic right shoulder pain 10/26/2014  . Peripheral nerve disease 10/22/2012   Thank you for your referral.  MJoneen BoersPT, DPT   03/17/2018, 4:46 PM  [facility] [address] [place] [address]Phone: [phone]  Fax:  [phone] Name: Anita WISORMRN: [id number] of Birth: 1Aug 02, 1942

## 2018-03-18 ENCOUNTER — Encounter: Payer: Self-pay | Admitting: Family Medicine

## 2018-03-18 ENCOUNTER — Other Ambulatory Visit: Payer: Self-pay

## 2018-03-18 ENCOUNTER — Ambulatory Visit (INDEPENDENT_AMBULATORY_CARE_PROVIDER_SITE_OTHER): Payer: Medicare Other | Admitting: Family Medicine

## 2018-03-18 VITALS — BP 148/68 | HR 62 | Temp 98.3°F | Ht 66.0 in | Wt 213.4 lb

## 2018-03-18 DIAGNOSIS — M159 Polyosteoarthritis, unspecified: Secondary | ICD-10-CM

## 2018-03-18 DIAGNOSIS — E039 Hypothyroidism, unspecified: Secondary | ICD-10-CM | POA: Diagnosis not present

## 2018-03-18 DIAGNOSIS — S82035A Nondisplaced transverse fracture of left patella, initial encounter for closed fracture: Secondary | ICD-10-CM

## 2018-03-18 DIAGNOSIS — K7581 Nonalcoholic steatohepatitis (NASH): Secondary | ICD-10-CM | POA: Diagnosis not present

## 2018-03-18 DIAGNOSIS — K7460 Unspecified cirrhosis of liver: Secondary | ICD-10-CM | POA: Diagnosis not present

## 2018-03-18 DIAGNOSIS — Z111 Encounter for screening for respiratory tuberculosis: Secondary | ICD-10-CM

## 2018-03-18 DIAGNOSIS — N3946 Mixed incontinence: Secondary | ICD-10-CM

## 2018-03-18 DIAGNOSIS — F419 Anxiety disorder, unspecified: Secondary | ICD-10-CM | POA: Diagnosis not present

## 2018-03-18 DIAGNOSIS — M150 Primary generalized (osteo)arthritis: Secondary | ICD-10-CM

## 2018-03-18 DIAGNOSIS — I10 Essential (primary) hypertension: Secondary | ICD-10-CM

## 2018-03-18 MED ORDER — VITAMIN B-12 1000 MCG PO TABS: 1000.0000 ug | ORAL_TABLET | Freq: Every day | ORAL | Status: DC

## 2018-03-18 NOTE — Patient Instructions (Addendum)
Thank you for coming to the office today.  Ask Nanine Means about reading the TB skin test today - they can do this if they prefer. Placed today Tuesday 03/18/18 at 3:30pm  Otherwise, we can have you return to see the nurse this week to read it.  WIll fax form [facility] and med list in morning on wednesday  Please schedule a Follow-up Appointment to: Return if symptoms worsen or fail to improve.  If you have any other questions or concerns, please feel free to call the office or send a message through [place]. You may also schedule an earlier appointment if necessary.  Additionally, you may be receiving a survey about your experience at our office within a few days to 1 week by e-mail or mail. We value your feedback.  Nobie Putnam, DO [place]

## 2018-03-18 NOTE — Assessment & Plan Note (Signed)
Stable chronic problem with multiple joints including fingers/hands, shoulders, knees with OA/DJD Prior x-rays confirm, and also had prior MRI imaging including shoulder Cannot take NSAIDs or higher dose tylenol due to liver Now followed by [facility] for L knee fracture (patella)  Plan: 1. Discussed that she may proceed with Orthopedic evaluation for hips and other joints as well, and continue her PT currently - May try previous rx trial Diclofenac voltaren topical gel apply TID PRN - Future may need injection of joints

## 2018-03-18 NOTE — Progress Notes (Addendum)
Subjective:    Patient ID: Anita Bennett, female    DOB: 1941/02/25, 76 y.o.   MRN: [id number]  Anita Bennett is a 77 y.o. female presenting on 03/18/2018 for Cirrhosis (FL2 form for assisting living placement )   HPI   Specialists: Hepatology/Liver - Cornelius Moras NP ([place])  Here today for [facility] completion and follow-up.  Cirrhosis secondary to NASH, with history of hepatic encephalopathy - Followed by [facility], Cornelius Moras NP. Last seen 11/2017. She has history of confirmed NASH cirrhosis on liver biopsy since 2011. Has followed chronically with [facility], has also had recent endoscopy. - Currently doing well. No recent flares of hepatic encephalopathy, controlled on current regimen with Xifaxan and stool softener laxatives, now on dulcolax OTC, no longer on lactulose. - History of chronic elevated bilirubin in past. No alcohol consumption history - Admits some issues with memory and very rare confusion but usually only if flare up with hepatic encephalopathy which again has been under control  Left Knee Patella Fracture - History of injury from fall in Winter 2018 she had fracture to Left patella knee cap and followed up with [facility] for this problem, did not have surgery, she has done physical therapy and conservative treatment. Ambulates with cane often.  CHRONIC HTN: Reports history of elevated BP, often worse if at doctors office. Admits white coat syndrome. Home readings have been unremarkable recently. Current Meds - Metoprolol XL 55m daily Reports good compliance, took meds today. Tolerating well, w/o complaints. Lifestyle: - Diet: tries to improve diet, low sodium - Exercise: difficulty walking due to hips and arthritis Denies CP, dyspnea, HA, edema, dizziness / lightheadedness  Osteoarthritis Multiple joints / Hands / R Shoulder / Knees - Reports chronic OA/DJD multiple joints especially knees (s/p knee surgery), both hands finger/thumb joints,  and shoulders with R shoulder worse with old injury, [facility] showed fluid within subcoracoid bursa concern for full thickeness rotator cuff tear, also tendinosis, and some tear of biceps tendon, she had seen [facility] in 2016 and 2017, for same problem, encouraged continued PT for rehab, she did this for up to 1 year some mixed results, and they recommended to return and consider [place] bicep tendon injection if indicated. She has not had any injections - She cannot take NSAIDs due to cirrhosis. Rarely takes Tylenol but she does take it as needed as advised by her GI doctor - She was given rx topical Voltaren but she did not start it due to concerns of this med, and she agrees to try it as needed  Hypothyroidism - Reports chronic history of hypothyroidism, last lab TSH normal - On Levothyroxine 1063m daily  History of Anxiety: Reports chronic problem, mostly related to family and life stressors. See prior note for general background information. - She has been taking Paxil 2042maily for long-term with good results, she is questioning if still needs this anymore, previous doctors told her to not discontinue it and it takes a long time to taper off of, therefore she has never tried. - She no longer takes Xanax, this was only as needed rarely and occasionally for migraine which have improved overall.  Mixed urinary incontinence, urge [facility] Cop[facility]qlyn Larsenhe has tried variety of medications and treatments, most recently Detrol-LA without effective results. She states that he left the practice and she was not set up with another provider, and she does not know where  to go now. She is not interested to return to Urology at this time. - She uses depends for her urinary incontinence issues daily   Health Maintenance:  Due for Flu Vaccine this season, not in stock today, will return or get at ALF or pharmacy  Due for routine PPD TB Skin  test today prior to admit to ALF.   Depression screen [facility] 2/9 03/18/2018 05/22/2017 04/05/2016  Decreased Interest - 0 0  Down, Depressed, Hopeless 0 0 0  PHQ - 2 Score 0 0 0  Altered sleeping 0 - -  Tired, decreased energy 0 - -  Change in appetite 0 - -  Feeling bad or failure about yourself  0 - -  Trouble concentrating 0 - -  Moving slowly or fidgety/restless 0 - -  Suicidal thoughts 0 - -  PHQ-9 Score 0 - -  Difficult doing work/chores Not difficult at all - -    Social History   Tobacco Use  . Smoking status: Never Smoker  . Smokeless tobacco: Never Used  Substance Use Topics  . Alcohol use: No  . Drug use: No    Review of Systems Per HPI unless specifically indicated above     Objective:    BP (!) 148/68 (BP Location: Left Arm, Cuff Size: Normal)   Pulse 62   Temp 98.3 F (36.8 C) (Oral)   Ht 5' 6"  (1.676 m)   Wt 213 lb 6.4 oz (96.8 kg)   BMI 34.44 kg/m   Wt Readings from Last 3 Encounters:  03/18/18 213 lb 6.4 oz (96.8 kg)  07/29/17 221 lb 9.6 oz (100.5 kg)  06/18/17 217 lb (98.4 kg)    Physical Exam  Constitutional: She is oriented to person, place, and time. She appears well-developed and well-nourished. No distress.  Well-appearing elderly female, comfortable, cooperative  HENT:  Head: Normocephalic and atraumatic.  Mouth/Throat: Oropharynx is clear and moist.  Eyes: Conjunctivae are normal. Right eye exhibits no discharge. Left eye exhibits no discharge.  Neck: Normal range of motion. Neck supple. No thyromegaly present.  Cardiovascular: Normal rate, regular rhythm and intact distal pulses.  Murmur (2/6 systolic aortic murmur, unchanged chronic) heard. Pulmonary/Chest: Effort normal and breath sounds normal. No respiratory distress. She has no wheezes. She has no rales.  Musculoskeletal: She exhibits no edema.  Bilateral hands with DIP, PIP and CMC joints with slight bulky appearance, without edema or erythema  Left knee mild discomfort with some  range of motion, otherwise no bruising or swelling or erythema.  Gait is cautious and slow, not using cane today.  Lymphadenopathy:    She has no cervical adenopathy.  Neurological: She is alert and oriented to person, place, and time.  Skin: Skin is warm and dry. No rash noted. She is not diaphoretic. No erythema.  Psychiatric: She has a normal mood and affect. Her behavior is normal.  Well groomed, good eye contact, normal speech and thoughts. Some difficulty with memory occasional forgetful with names and recalling details, but long-term recall is intact. Appropriate behavior and follows commands  Nursing note and vitals reviewed.      Assessment & Plan:   Problem List Items Addressed This Visit    Adult hypothyroidism    Controlled Stable TSH Continue Levothyroxine 130mg daily      Anxiety    Stable chronic anxiety, now mostly resolved but feels that she is not ready to come off of Paxil Remains off Xanax, discontinued (also for migraine PRN less often)  Plan Discussed SSRI - advised her that I would support gradual taper off in future, however today given plan to admit to ALF agree to continue with what has worked for her and not change rx - continue Paxil for now      Essential hypertension    Elevated BP, improve on re-check still elevated. Still concern white coat syndrome - Home BP readings improved  Complication with cirrhosis    Plan:  1. Continue current BP regimen - Metoprolol XL 33m daily - defer changes to meds at this time. 2. Encourage improved lifestyle - low sodium diet, regular exercise as tolerated 3. Continue monitor BP outside office, bring readings to next visit, if persistently >140/90 or new symptoms notify office sooner      Liver cirrhosis secondary to NASH (nonalcoholic steatohepatitis) ([place] - Primary    Chronic problem with liver cirrhosis secondary to NASH, confirmed on biopsy 2011 At risk of hepatic encephalopathy Followed by [place] - Last visit 11/2017  Plan - Continue with management per GI with Xifaxan BID and OTC Stool softener/Laxative (Dulcolax) for prevention of future hepatic encephalopathy Follow-up as planned with [facility]      Osteoarthritis, multiple sites    Stable chronic problem with multiple joints including fingers/hands, shoulders, knees with OA/DJD Prior x-rays confirm, and also had prior MRI imaging including shoulder Cannot take NSAIDs or higher dose tylenol due to liver Now followed by [facility] for L knee fracture (patella)  Plan: 1. Discussed that she may proceed with Orthopedic evaluation for hips and other joints as well, and continue her PT currently - May try previous rx trial Diclofenac voltaren topical gel apply TID PRN - Future may need injection of joints      Relevant Medications   diclofenac sodium (VOLTAREN) 1 % GEL   Urinary incontinence, mixed    Stable chronic problem, now off medication [facility]  Plan Remain off med, no changes today Contiue wearing depends for urinary incontinence Offered referral back to Urology can go to local [facility] - but she declines at this time       Other Visit Diagnoses    Screening-pulmonary TB     Placement today skin PPD TB screen, no prior abnormal Advised should return for reading >48 hours, she may ask BNanine Meansif they can read this test at their facility or she may return here.   Relevant Orders   PPD (Completed)   Closed nondisplaced transverse fracture of left patella, initial encounter     Improved Followed by [facility]- did not require surgical fix On PT      Current Outpatient Medications:  .  acetaminophen (TYLENOL) 500 MG tablet, Take 500 mg by mouth every 6 (six) hours as needed., Disp: , Rfl:  .  bisacodyl (DULCOLAX) 5 MG EC tablet, Take 5 mg by mouth daily., Disp: , Rfl:  .  levothyroxine (SYNTHROID, LEVOTHROID) 100 MCG tablet,  Take 1 tablet (100 mcg total) by mouth daily., Disp: 90 tablet, Rfl: 3 .  metoprolol succinate (TOPROL-XL) 25 MG 24 hr tablet, Take 1 tablet (25 mg total) by mouth daily., Disp: 90 tablet, Rfl: 3 .  MULTIPLE VITAMINS PO, Take by mouth., Disp: , Rfl:  .  PARoxetine (PAXIL) 20 MG tablet, Take 1 tablet (20 mg total) by mouth daily., Disp: 90 tablet, Rfl: 3 .  rifaximin (XIFAXAN) 550 MG TABS tablet, Take 550 mg by mouth  2 (two) times daily., Disp: , Rfl:  .  diclofenac sodium (VOLTAREN) 1 % GEL, Apply 2 g topically 3 (three) times daily as needed. For hands, osteoarthritis, Disp: 100 g, Rfl: 5 .  vitamin B-12 (CYANOCOBALAMIN) 1000 MCG tablet, Take 1 tablet (1,000 mcg total) by mouth daily., Disp: , Rfl:     Completed FL2 Form today after visit, as patient is being admitted to [facility] for ALF services. Primary admitting diagnosis is Cirrhosis NASH with history of hepatic encephalopathy. Medication list is updated and provided on form.  Will turn form in to front office, we will need copy scanned, and they may fax and or notify patient that it is ready.  Follow up plan: Return if symptoms worsen or fail to improve.  Nobie Putnam, [facility] 03/18/2018, 5:45 PM

## 2018-03-18 NOTE — Assessment & Plan Note (Signed)
Stable chronic anxiety, now mostly resolved but feels that she is not ready to come off of Paxil Remains off Xanax, discontinued (also for migraine PRN less often)  Plan Discussed SSRI - advised her that I would support gradual taper off in future, however today given plan to admit to ALF agree to continue with what has worked for her and not change rx - continue Paxil for now

## 2018-03-18 NOTE — Assessment & Plan Note (Signed)
Controlled Stable TSH Continue Levothyroxine 181mg daily

## 2018-03-18 NOTE — Assessment & Plan Note (Signed)
Stable chronic problem, now off medication and no longer followed by [facility] after provider left  Plan Remain off med, no changes today Contiue wearing depends for urinary incontinence Offered referral back to Urology can go to local [facility] - but she declines at this time

## 2018-03-18 NOTE — Assessment & Plan Note (Signed)
Elevated BP, improve on re-check still elevated. Still concern white coat syndrome - Home BP readings improved  Complication with cirrhosis    Plan:  1. Continue current BP regimen - Metoprolol XL 37m daily - defer changes to meds at this time. 2. Encourage improved lifestyle - low sodium diet, regular exercise as tolerated 3. Continue monitor BP outside office, bring readings to next visit, if persistently >140/90 or new symptoms notify office sooner

## 2018-03-18 NOTE — Addendum Note (Signed)
Addended by: Olin Hauser on: 03/18/2018 05:45 PM   Modules accepted: Orders

## 2018-03-18 NOTE — Assessment & Plan Note (Signed)
Chronic problem with liver cirrhosis secondary to NASH, confirmed on biopsy 2011 At risk of hepatic encephalopathy Followed by [place] NP - Last visit 11/2017  Plan - Continue with management per GI with Xifaxan BID and OTC Stool softener/Laxative (Dulcolax) for prevention of future hepatic encephalopathy Follow-up as planned with [facility]

## 2018-03-19 ENCOUNTER — Ambulatory Visit: Payer: Medicare Other

## 2018-03-20 ENCOUNTER — Other Ambulatory Visit: Payer: Self-pay

## 2018-03-20 DIAGNOSIS — E038 Other specified hypothyroidism: Secondary | ICD-10-CM

## 2018-03-20 DIAGNOSIS — F419 Anxiety disorder, unspecified: Secondary | ICD-10-CM

## 2018-03-20 DIAGNOSIS — I10 Essential (primary) hypertension: Secondary | ICD-10-CM

## 2018-03-20 MED ORDER — PAROXETINE HCL 20 MG PO TABS: 20.0000 mg | ORAL_TABLET | Freq: Every day | ORAL | 3 refills | Status: DC

## 2018-03-20 MED ORDER — LEVOTHYROXINE SODIUM 100 MCG PO TABS: 100.0000 ug | ORAL_TABLET | Freq: Every day | ORAL | 3 refills | Status: DC

## 2018-03-20 MED ORDER — METOPROLOL SUCCINATE ER 25 MG PO TB24: 25.0000 mg | ORAL_TABLET | Freq: Every day | ORAL | 3 refills | Status: DC

## 2018-03-26 ENCOUNTER — Ambulatory Visit: Payer: Medicare Other | Attending: Gastroenterology

## 2018-04-01 ENCOUNTER — Encounter: Payer: Self-pay | Admitting: Family Medicine

## 2018-04-01 ENCOUNTER — Ambulatory Visit
Admission: RE | Admit: 2018-04-01 | Discharge: 2018-04-01 | Disposition: A | Discharge status: Home / Self Care | Payer: Medicare Other | Source: Ambulatory Visit | Attending: Family Medicine | Admitting: Family Medicine

## 2018-04-01 ENCOUNTER — Ambulatory Visit: Payer: Medicare Other

## 2018-04-01 ENCOUNTER — Ambulatory Visit (INDEPENDENT_AMBULATORY_CARE_PROVIDER_SITE_OTHER): Payer: Medicare Other | Admitting: Family Medicine

## 2018-04-01 ENCOUNTER — Other Ambulatory Visit: Payer: Self-pay

## 2018-04-01 VITALS — BP 170/86 | HR 56 | Temp 98.3°F | Ht 66.0 in | Wt 214.0 lb

## 2018-04-01 DIAGNOSIS — K7581 Nonalcoholic steatohepatitis (NASH): Secondary | ICD-10-CM | POA: Diagnosis not present

## 2018-04-01 DIAGNOSIS — K7460 Unspecified cirrhosis of liver: Secondary | ICD-10-CM | POA: Diagnosis not present

## 2018-04-01 DIAGNOSIS — R197 Diarrhea, unspecified: Secondary | ICD-10-CM | POA: Diagnosis not present

## 2018-04-01 DIAGNOSIS — R1032 Left lower quadrant pain: Secondary | ICD-10-CM | POA: Diagnosis not present

## 2018-04-01 DIAGNOSIS — N200 Calculus of kidney: Secondary | ICD-10-CM | POA: Diagnosis not present

## 2018-04-01 NOTE — Progress Notes (Signed)
Subjective:    Patient ID: Anita Bennett, female    DOB: 03/20/1941, 77 y.o.   MRN: [id number]  Anita Bennett is a 77 y.o. female presenting on 04/01/2018 for Diarrhea (recently moved into assisted living. stool incontinence x 2 weeks. Only in the morning after breakfast. ) and Abdominal Pain (constant LLQ pain x 8 hrs, nausea )  Patient presents for a same day appointment.  HPI   Diarrhea and some Fecal Incontinence / LLQ Abdominal Pain Reports since moved into ALF she had problem with diarrhea, watery loose stool in AM most days for past 2 weeks, has some fecal incontinence, and has 2nd BM later that is also watery and loose. Diarrhea leaves her feeling "wiped out" and weak and more drained after. She tries to stay hydrated, tolerating gatorade and water. She thinks food is different or some dietary factor possibly the eggs causing it. She called her [place] NP office today, and spoke with nurse triage - they reported that symptoms were not related to her liver and that she should be evaluated here today for further testing for possible infectious etiology, stool test and possibly food sensitivity. They were concerned about dehydration - More recently within past several hours < 24 hours she developed some LLQ abdominal discomfort, episodic. - Patient is asking about urine testing, but she states no change in her urinary habits, still having some urinary incontinence at times, she is not having dysuria, or hematuria - She stopped taking her Dulcolax stool softener laxative when diarrhea started 2 weeks ago, she has not been taking this. - Abdominal surgery history cholecystectomy, hysterectomy. See below - Admtis nausea without vomiting - Denies fever, chills, sweats, active abdominal pain at this time or abdominal cramping   Depression screen [facility] 2/9 03/18/2018 05/22/2017 04/05/2016  Decreased Interest - 0 0  Down, Depressed, Hopeless 0 0 0  PHQ - 2 Score 0 0 0    Altered sleeping 0 - -  Tired, decreased energy 0 - -  Change in appetite 0 - -  Feeling bad or failure about yourself  0 - -  Trouble concentrating 0 - -  Moving slowly or fidgety/restless 0 - -  Suicidal thoughts 0 - -  PHQ-9 Score 0 - -  Difficult doing work/chores Not difficult at all - -   Past Surgical History:  Procedure Laterality Date  . arm surgery    . BACK SURGERY    . CHOLECYSTECTOMY    . TOE SURGERY    . TOTAL ABDOMINAL HYSTERECTOMY    . TOTAL KNEE ARTHROPLASTY Bilateral      Social History   Tobacco Use  . Smoking status: Never Smoker  . Smokeless tobacco: Never Used  Substance Use Topics  . Alcohol use: No  . Drug use: No    Review of Systems Per HPI unless specifically indicated above     Objective:    BP (!) 170/86 (BP Location: Right Arm, Patient Position: Sitting, Cuff Size: Normal)   Pulse (!) 56   Temp 98.3 F (36.8 C) (Oral)   Ht 5' 6"  (1.676 m)   Wt 214 lb (97.1 kg)   BMI 34.54 kg/m   Wt Readings from Last 3 Encounters:  04/01/18 214 lb (97.1 kg)  03/18/18 213 lb 6.4 oz (96.8 kg)  07/29/17 221 lb 9.6 oz (100.5 kg)    Physical Exam  Constitutional: She is oriented to person, place, and time. She appears well-developed and well-nourished.  No distress.  Appears well, comfortable, cooperative  HENT:  Head: Normocephalic and atraumatic.  Mouth/Throat: Oropharynx is clear and moist.  Eyes: Conjunctivae are normal. Right eye exhibits no discharge. Left eye exhibits no discharge.  Neck: Normal range of motion. Neck supple. No thyromegaly present.  Cardiovascular: Normal rate, regular rhythm, normal heart sounds and intact distal pulses.  No murmur heard. Pulmonary/Chest: Effort normal and breath sounds normal. No respiratory distress. She has no wheezes. She has no rales.  Abdominal: Soft. She exhibits no distension and no mass. There is no tenderness (Not reproduced left lower quad). There is no rebound and no guarding.  Negative RUQ  pain. Negative RLQ pain.  Hyperactive bowel sounds diffusely  Musculoskeletal: Normal range of motion. She exhibits no edema.  Lymphadenopathy:    She has no cervical adenopathy.  Neurological: She is alert and oriented to person, place, and time.  Skin: Skin is warm and dry. No rash noted. She is not diaphoretic. No erythema.  Psychiatric: She has a normal mood and affect. Her behavior is normal.  Well groomed, good eye contact, normal speech and thoughts  Nursing note and vitals reviewed.  Results for orders placed or performed in visit on 06/18/17  Urine Culture  Result Value Ref Range   MICRO NUMBER: [id number]    SPECIMEN QUALITY: ADEQUATE    Sample Source URINE, CLEAN CATCH    STATUS: FINAL    ISOLATE 1: Genus Enterococcus (A)       Susceptibility   Genus enterococcus - URINE CULTURE POSITIVE 1    AMPICILLIN <=2 Sensitive     VANCOMYCIN 1 Sensitive     NITROFURANTOIN* <=16 Sensitive      * Legend:S = Susceptible  I = IntermediateR = Resistant  NS = Not susceptible* = Not tested  NR = Not reported**NN = See antimicrobic comments  COMPLETE METABOLIC PANEL WITH GFR  Result Value Ref Range   Glucose, Bld 74 65 - 139 mg/dL   BUN 8 7 - 25 mg/dL   Creat 1.12 (H) 0.60 - 0.93 mg/dL   GFR, Est Non African American 47 (L) > OR = 60 mL/min/1.83m   GFR, Est African American 54 (L) > OR = 60 mL/min/1.737m  BUN/Creatinine Ratio 7 6 - 22 (calc)   Sodium 140 135 - 146 mmol/L   Potassium 4.8 3.5 - 5.3 mmol/L   Chloride 105 98 - 110 mmol/L   CO2 27 20 - 32 mmol/L   Calcium 9.4 8.6 - 10.4 mg/dL   Total Protein 7.1 6.1 - 8.1 g/dL   Albumin 4.2 3.6 - 5.1 g/dL   Globulin 2.9 1.9 - 3.7 g/dL (calc)   AG Ratio 1.4 1.0 - 2.5 (calc)   Total Bilirubin 1.4 (H) 0.2 - 1.2 mg/dL   Alkaline phosphatase (APISO) 54 33 - 130 U/L   AST 53 (H) 10 - 35 U/L   ALT 28 6 - 29 U/L  CBC with Differential/Platelet  Result Value Ref Range   WBC 5.9 3.8 - 10.8 Thousand/uL   RBC 4.42 3.80 - 5.10 Million/uL    Hemoglobin 13.1 11.7 - 15.5 g/dL   HCT 39.4 35.0 - 45.0 %   MCV 89.1 80.0 - 100.0 fL   MCH 29.6 27.0 - 33.0 pg   MCHC 33.2 32.0 - 36.0 g/dL   RDW 13.0 11.0 - 15.0 %   Platelets 184 140 - 400 Thousand/uL   MPV 11.4 7.5 - 12.5 fL   Neutro Abs 2,926 1,500 - 7,800  cells/uL   Lymphs Abs 2,189 850 - 3,900 cells/uL   WBC mixed population 507 200 - 950 cells/uL   Eosinophils Absolute 177 15 - 500 cells/uL   Basophils Absolute 100 0 - 200 cells/uL   Neutrophils Relative % 49.6 %   Total Lymphocyte 37.1 %   Monocytes Relative 8.6 %   Eosinophils Relative 3.0 %   Basophils Relative 1.7 %  POCT urinalysis dipstick  Result Value Ref Range   Color, UA dark amber    Clarity, UA clear    Glucose, UA negative    Bilirubin, UA negative    Ketones, UA negative    Spec Grav, UA 1.010 1.010 - 1.025   Blood, UA negative    pH, UA 5.0 5.0 - 8.0   Protein, UA negative    Urobilinogen, UA 0.2 0.2 or 1.0 E.U./dL   Nitrite, UA negative    Leukocytes, UA Trace (A) Negative      Assessment & Plan:   Problem List Items Addressed This Visit    Liver cirrhosis secondary to NASH (nonalcoholic steatohepatitis) (HCC)   Relevant Orders   COMPLETE METABOLIC PANEL WITH GFR   Ammonia    Other Visit Diagnoses    Watery diarrhea    -  Primary   Relevant Orders   Stool culture   Clostridium difficile Toxin B, Qualitative, Real-Time PCR([facility])   DG Abd 1 View   Left lower quadrant pain       Relevant Orders   COMPLETE METABOLIC PANEL WITH GFR   CBC with Differential/Platelet   DG Abd 1 View       Clinically with watery diarrhea x 2 weeks after switching residence to ALF Cannot rule out C Diff given change of environment, may be possible. Well appearing, and seems hydrated on exam. Hemodynamically stable, with elevated BP actually. Not tachycardic. Afebrile. No red flag GI symptoms. She has benign abdomen today on exam without reproducible pain. Not increasing frequency diarrhea, seems PO intake is  maintaining hydration and nutrition. - Off Dulcolax now x 2 weeks, does not seem to be triggered by medication - Concern with history of cirrhosis NASH, she has history of hepatic encephalopathy - but now with loose stools this should not be main concern - Reviewed telephone / recommendations from her [facility]ology Cornelius Moras NP  Plan - Discussed treatment options for her. Given no acute abdomen and seems persistent stable problem now x 2 weeks, emphasized maintain hydration and nutrition as primary goals. - Will start with initial testing now, proceed to KUB X-ray to rule out possible underlying deeper constipation now off her dulcolax, may have some encopresis with loose watery stool around an area of constipation - may be possible - pending result will f/u with patient - Recommended stool testing, patient does not have to BM at this time, she was given kit / collection containers and instructions for stool sample collect to bring back this week, she states soonest she can get tested will be Thursday AM - Ordered Stool Culture and C Diff Toxin w/ reflex testing - Also ordered blood tests, for chemistry CMET and CBC - will be drawn in AM Thurs 9/12, eval for dehydration, kidney function, electrolytes and possible infectious etiology  Offered Zofran ODT today for nausea to help prevent worsening problem, she declined and stated that she would call us back if she was interested in Zofran for nausea.  Also offered Dicyclomine PRN for abdominal cramping and discomfort related to meals, but she  declined as well.  In future we can reconsider other dietary triggers, if testing is unremarkable, may be more functional diarrhea, make sure she is not consuming significant amount of coffee, sweets, or sugary food/drink in AM, hard candy, mints or gum  Follow-up as needed - will forward results to her [place] NP once results are available.  No orders of the defined types were placed in this  encounter.  Orders Placed This Encounter  Procedures  . Stool culture  . DG Abd 1 View    Standing Status:   Future    Number of Occurrences:   1    Standing Expiration Date:   06/01/2019    Order Specific Question:   Reason for Exam (SYMPTOM  OR DIAGNOSIS REQUIRED)    Answer:   left lower abdominal pain, new diarrhea watery 1-2 times daily 2 weeks, on meds for cirrhosis    Order Specific Question:   Preferred imaging location?    Answer:   [facility]  . Clostridium difficile Toxin B, Qualitative, Real-Time PCR([facility])  . COMPLETE METABOLIC PANEL WITH GFR  . CBC with Differential/Platelet  . Ammonia    Follow up plan: Return in about 1 week (around 04/08/2018), or if symptoms worsen or fail to improve, for diarrhea.  Nobie Putnam, DO [facility] 04/01/2018, 6:16 PM

## 2018-04-01 NOTE — Patient Instructions (Addendum)
Thank you for coming to the office today.  Concern for possible infection - maybe C Diff but seems less likely - possibly due to dietary changes at new living environment.  Remain off laxative as long as having loose watery stools still  We will check stool culture and study and C Diff test as recommended  Also return for lab draw later this week at same time you bring stool samples back.  X-ray today, stay tuned for results  ----------------------------------------------------  Will send results to your [facility] specialist.  If not improving - or worsening pain, diarrhea, dehydration, fever - can go to hospital ED for further evaluation   Please schedule a Follow-up Appointment to: Return in about 1 week (around 04/08/2018), or if symptoms worsen or fail to improve, for diarrhea.  If you have any other questions or concerns, please feel free to call the office or send a message through [place]. You may also schedule an earlier appointment if necessary.  Additionally, you may be receiving a survey about your experience at our office within a few days to 1 week by e-mail or mail. We value your feedback.  Nobie Putnam, DO [place]

## 2018-04-02 ENCOUNTER — Telehealth: Payer: Self-pay

## 2018-04-02 NOTE — Telephone Encounter (Signed)
Called patient to check up on her due to missed visits. Pt states that she has been busy. Recently moved to [facility] assisted living. Also got sick. Might have been due to the food there. Getting her L knee checked by an orthopedic doctor at [facility] tomorrow. Will call back to let us know if the doctor wants her to continue PT. Pt also states that she will ask him about her hip. Also states that she thinks her L knee is some better.

## 2018-04-03 ENCOUNTER — Other Ambulatory Visit: Payer: Medicare Other

## 2018-04-03 DIAGNOSIS — M76891 Other specified enthesopathies of right lower limb, excluding foot: Secondary | ICD-10-CM | POA: Diagnosis not present

## 2018-04-03 DIAGNOSIS — S82032P Displaced transverse fracture of left patella, subsequent encounter for closed fracture with malunion: Secondary | ICD-10-CM | POA: Diagnosis not present

## 2018-04-03 DIAGNOSIS — Z96652 Presence of left artificial knee joint: Secondary | ICD-10-CM | POA: Diagnosis not present

## 2018-04-03 DIAGNOSIS — S82032A Displaced transverse fracture of left patella, initial encounter for closed fracture: Secondary | ICD-10-CM | POA: Diagnosis not present

## 2018-04-07 ENCOUNTER — Ambulatory Visit: Payer: Medicare Other

## 2018-04-09 DIAGNOSIS — M76891 Other specified enthesopathies of right lower limb, excluding foot: Secondary | ICD-10-CM | POA: Diagnosis not present

## 2018-04-14 ENCOUNTER — Ambulatory Visit: Payer: Medicare Other

## 2018-04-16 ENCOUNTER — Ambulatory Visit: Payer: Medicare Other

## 2018-04-21 ENCOUNTER — Ambulatory Visit: Payer: Medicare Other

## 2018-04-23 ENCOUNTER — Ambulatory Visit: Payer: Medicare Other | Attending: Gastroenterology

## 2018-05-15 ENCOUNTER — Encounter: Payer: Self-pay | Admitting: Family Medicine

## 2018-05-15 ENCOUNTER — Ambulatory Visit (INDEPENDENT_AMBULATORY_CARE_PROVIDER_SITE_OTHER): Payer: Medicare Other | Admitting: Family Medicine

## 2018-05-15 VITALS — BP 162/76 | HR 64 | Temp 98.3°F | Resp 16 | Ht 66.0 in | Wt 216.0 lb

## 2018-05-15 DIAGNOSIS — R2681 Unsteadiness on feet: Secondary | ICD-10-CM | POA: Diagnosis not present

## 2018-05-15 DIAGNOSIS — R350 Frequency of micturition: Secondary | ICD-10-CM

## 2018-05-15 DIAGNOSIS — N3946 Mixed incontinence: Secondary | ICD-10-CM

## 2018-05-15 LAB — POCT URINALYSIS DIPSTICK
Bilirubin, UA: NEGATIVE
GLUCOSE UA: NEGATIVE
KETONES UA: NEGATIVE
LEUKOCYTES UA: NEGATIVE
Nitrite, UA: NEGATIVE
Protein, UA: NEGATIVE
RBC UA: NEGATIVE
SPEC GRAV UA: 1.025 (ref 1.010–1.025)
Urobilinogen, UA: 0.2 E.U./dL
pH, UA: 7.5 (ref 5.0–8.0)

## 2018-05-15 NOTE — Patient Instructions (Addendum)
Thank you for coming to the office today.  Follow-up with your Liver Specialist to discuss symptoms as well - see what their opinion is  Follow-up with Orthopedic to continue with your treatment for hip and knee - I think most likely cause of your feelings of weakness are from muscle and joints, as your strength does seem intact  We will check urine for urinary tract infection and urine culture - stay tuned for results, may take 24-48 hours in future.  If you get blood work through [employer] let me know, otherwise   [place] -[address],  [place]  [address] Phone: [phone]  Please schedule a Follow-up Appointment to: Return in about 3 months (around 08/15/2018).  If you have any other questions or concerns, please feel free to call the office or send a message through [place]. You may also schedule an earlier appointment if necessary.  Additionally, you may be receiving a survey about your experience at our office within a few days to 1 week by e-mail or mail. We value your feedback.  Nobie Putnam, DO [place]

## 2018-05-15 NOTE — Progress Notes (Signed)
Subjective:    Patient ID: Anita Bennett, female    DOB: [birth year], 77 y.o.   MRN: [id number]  Anita Bennett is a 77 y.o. female presenting on 05/15/2018 for Night Sweats and Leg Pain   HPI   Night Sweats Additional concern, with recent worsening night sweats, had some usually in past, now seems to still have with feeling warm and episode of night sweat drenches her clothing.  Mixed urinary incontinence, urge / Urgency Previously followed by [facility] Dr Jacqlyn Larsen, she has tried variety of medications and treatments, most recently Detrol-LA without effective results. She states that he left the practice and she was not set up with another provider, and she does not know where to go now - Today she is interested to seek 2nd opinion from Urology. She has complains of persistent urinary incontinence, wears depends often - Now for about a week she admits urinary odor and some urgency - she is asking and family member asked if she may have UTI, request check urine today. She denies dysuria, hematuria, fever chills, nausea vomiting, abdominal pain  Generalized Lower Extremity Weakness / OA/DJD multiple joints - Reports today that she has had some recent worsening in past week, thinks may be related to urinary symptoms. She describes weakness in legs, still able to ambulate with caution and asistance, seems worse recently, has had known weakness in past from knees. - Returning to knee doctor - for follow-up, L knee fracture before December 2018, not healed properly - Denies injury trauma redness or other new swelling, other pain - She cannot take NSAIDs due to cirrhosis. Rarely takes Tylenol but she does take it as needed as advised by her GI doctor   Health Maintenance: Due for Flu Shot, will receive today    Depression screen [facility] 2/9 05/15/2018 03/18/2018 05/22/2017  Decreased Interest 0 - 0  Down, Depressed, Hopeless 0 0 0  PHQ - 2 Score 0 0 0  Altered sleeping 0 0 -    Tired, decreased energy 0 0 -  Change in appetite 0 0 -  Feeling bad or failure about yourself  0 0 -  Trouble concentrating 0 0 -  Moving slowly or fidgety/restless 0 0 -  Suicidal thoughts 0 0 -  PHQ-9 Score 0 0 -  Difficult doing work/chores Not difficult at all Not difficult at all -    Social History   Tobacco Use  . Smoking status: Never Smoker  . Smokeless tobacco: Never Used  Substance Use Topics  . Alcohol use: No  . Drug use: No    Review of Systems Per HPI unless specifically indicated above     Objective:    BP (!) 162/76   Pulse 64   Temp 98.3 F (36.8 C) (Oral)   Resp 16   Ht 5' 6"  (1.676 m)   Wt 216 lb (98 kg)   BMI 34.86 kg/m   Wt Readings from Last 3 Encounters:  05/15/18 216 lb (98 kg)  04/01/18 214 lb (97.1 kg)  03/18/18 213 lb 6.4 oz (96.8 kg)    Physical Exam  Constitutional: She is oriented to person, place, and time. She appears well-developed and well-nourished. No distress.  Well-appearing elderly female, comfortable, cooperative  HENT:  Head: Normocephalic and atraumatic.  Mouth/Throat: Oropharynx is clear and moist.  Eyes: Conjunctivae are normal. Right eye exhibits no discharge. Left eye exhibits no discharge.  Neck: Normal range of motion. Neck supple. No thyromegaly present.  Cardiovascular:  Normal rate, regular rhythm and intact distal pulses.  Murmur (2/6 systolic aortic murmur, unchanged chronic) heard. Pulmonary/Chest: Effort normal and breath sounds normal. No respiratory distress. She has no wheezes. She has no rales.  Musculoskeletal: She exhibits no edema.  Both knees w/ some deformity R knee has larger bulky appearance  Left knee mild discomfort with some range of motion, otherwise no bruising or swelling or erythema.  Gait is cautious and slow, using cane today.  Lymphadenopathy:    She has no cervical adenopathy.  Neurological: She is alert and oriented to person, place, and time.  Skin: Skin is warm and dry. No  rash noted. She is not diaphoretic. No erythema.  Psychiatric: She has a normal mood and affect. Her behavior is normal.  Well groomed, good eye contact, normal speech and thoughts. Some difficulty with memory occasional forgetful with names and recalling details, but long-term recall is intact. Appropriate behavior and follows commands  Nursing note and vitals reviewed.     Results for orders placed or performed in visit on 05/15/18  POCT urinalysis dipstick  Result Value Ref Range   Color, UA dark yellow    Clarity, UA clear    Glucose, UA Negative Negative   Bilirubin, UA negative    Ketones, UA negative    Spec Grav, UA 1.025 1.010 - 1.025   Blood, UA negative    pH, UA 7.5 5.0 - 8.0   Protein, UA Negative Negative   Urobilinogen, UA 0.2 0.2 or 1.0 E.U./dL   Nitrite, UA negative    Leukocytes, UA Negative Negative   Appearance     Odor        Assessment & Plan:   Problem List Items Addressed This Visit    Gait instability Lower Ext Weakness Chronic problems, well documented in past with known lower extremity weakness, w/ knee OA DJD and instability, uses cane / walker routinely. Difficult to determine current weakness worsening  Plan Reassurance, follow back up with Ortho as planned Continue cane/walker assistance    Urinary incontinence, mixed - Primary  Given persistence and worsening WIll proceed with referral to local [facility] Dr Hollice Espy for 2nd opinion, now that no longer followed by Dr Jacqlyn Larsen [facility]    Relevant Orders   Ambulatory referral to Urology   Urine Culture   POCT urinalysis dipstick (Completed)    Other Visit Diagnoses    Urinary frequency      Clinically less suspicious for UTI, agree to check given history of urinary incontinence among other symptoms - UA not suggestive of UTI - Sent for culture, will follow-up, notify of results, may not need treatment    Relevant Orders   Urine Culture   POCT urinalysis dipstick  (Completed)      No orders of the defined types were placed in this encounter.  Orders Placed This Encounter  Procedures  . Urine Culture  . Ambulatory referral to Urology    Referral Priority:   Routine    Referral Type:   Consultation    Referral Reason:   Specialty Services Required    Referred to Provider:   Hollice Espy, MD    Requested Specialty:   Urology    Number of Visits Requested:   1  . POCT urinalysis dipstick     Follow up plan: Return in about 3 months (around 08/15/2018).  Nobie Putnam, [facility] 05/15/2018, 11:51 AM

## 2018-05-16 LAB — URINE CULTURE
MICRO NUMBER:: 91280941
SPECIMEN QUALITY: ADEQUATE

## 2018-05-23 DIAGNOSIS — D485 Neoplasm of uncertain behavior of skin: Secondary | ICD-10-CM | POA: Diagnosis not present

## 2018-05-23 DIAGNOSIS — L821 Other seborrheic keratosis: Secondary | ICD-10-CM | POA: Diagnosis not present

## 2018-05-23 DIAGNOSIS — L820 Inflamed seborrheic keratosis: Secondary | ICD-10-CM | POA: Diagnosis not present

## 2018-05-23 DIAGNOSIS — Z85828 Personal history of other malignant neoplasm of skin: Secondary | ICD-10-CM | POA: Diagnosis not present

## 2018-06-02 ENCOUNTER — Ambulatory Visit: Payer: Medicare Other | Admitting: Urology

## 2018-06-11 DIAGNOSIS — K7460 Unspecified cirrhosis of liver: Secondary | ICD-10-CM | POA: Diagnosis not present

## 2018-06-11 DIAGNOSIS — K7581 Nonalcoholic steatohepatitis (NASH): Secondary | ICD-10-CM | POA: Diagnosis not present

## 2018-06-16 ENCOUNTER — Other Ambulatory Visit: Payer: Self-pay | Admitting: Family Medicine

## 2018-06-16 DIAGNOSIS — F419 Anxiety disorder, unspecified: Secondary | ICD-10-CM

## 2018-06-16 MED ORDER — ALPRAZOLAM 0.5 MG PO TABS: 0.5000 mg | ORAL_TABLET | Freq: Every day | ORAL | 0 refills | Status: DC | PRN

## 2018-06-30 ENCOUNTER — Ambulatory Visit (INDEPENDENT_AMBULATORY_CARE_PROVIDER_SITE_OTHER): Payer: Medicare Other | Admitting: Urology

## 2018-06-30 VITALS — BP 192/82 | HR 70 | Ht 66.0 in | Wt 217.5 lb

## 2018-06-30 DIAGNOSIS — R32 Unspecified urinary incontinence: Secondary | ICD-10-CM

## 2018-06-30 DIAGNOSIS — N3946 Mixed incontinence: Secondary | ICD-10-CM

## 2018-06-30 LAB — URINALYSIS, COMPLETE
BILIRUBIN UA: NEGATIVE
Glucose, UA: NEGATIVE
Ketones, UA: NEGATIVE
Nitrite, UA: NEGATIVE
PH UA: 6.5 (ref 5.0–7.5)
Protein, UA: NEGATIVE
RBC, UA: NEGATIVE
Specific Gravity, UA: 1.015 (ref 1.005–1.030)
Urobilinogen, Ur: 1 mg/dL (ref 0.2–1.0)

## 2018-06-30 LAB — MICROSCOPIC EXAMINATION

## 2018-06-30 MED ORDER — MIRABEGRON ER 50 MG PO TB24: 50.0000 mg | ORAL_TABLET | Freq: Every day | ORAL | 11 refills | Status: DC

## 2018-06-30 NOTE — Progress Notes (Signed)
06/30/2018 3:18 PM   Anita Bennett [birth year] [id number]  Referring provider: Olin Hauser, DO [address] [place], [place] [address]  Chief Complaint  Patient presents with  . Urinary Incontinence    HPI: Patient was followed by Dr. Jacqlyn Larsen for recurrent bladder infections incomplete bladder emptying and Detrol for urge incontinence.  The patient reports urge incontinence and leaking when she goes from a sitting to standing position and foot on the floor syndrome high-volume.  She leaks with coughing sneezing and sometimes bending and lifting.  She reports both significant but by history urge component more severe.  She denies bedwetting.  She voids every 3 or 4 hours and gets up 2-3 times a night.  Patient reports that Detrol is her only medication she is taken for overactive bladder and it failed  She is passed a kidney stone.  She has had low back surgery  She denies a history of previous to surgery urinary tract infections and I believe she has had a hysterectomy  Modifying factors: There are no other modifying factors  Associated signs and symptoms: There are no other associated signs and symptoms Aggravating and relieving factors: There are no other aggravating or relieving factors Severity: Moderate Duration: Persistent   PMH: Past Medical History:  Diagnosis Date  . Arrhythmia   . H/O knee surgery    2011 [facility] knee (replacement)  . Heart murmur   . History of kidney stones   . History of toe surgery    12/01/14 [facility]  . Hyperlipidemia   . Thyroid disease     Surgical History: Past Surgical History:  Procedure Laterality Date  . arm surgery    . BACK SURGERY    . CHOLECYSTECTOMY    . TOE SURGERY    . TOTAL ABDOMINAL HYSTERECTOMY    . TOTAL KNEE ARTHROPLASTY Bilateral     Home Medications:  Allergies as of 06/30/2018      Reactions   Epinephrine    Heart race   Promethazine Hcl    hyperactivity   Statins       Medication List        Accurate as of 06/30/18  3:18 PM. Always use your most recent med list.          acetaminophen 500 MG tablet Commonly known as:  TYLENOL Take 500 mg by mouth every 6 (six) hours as needed.   ALPRAZolam 0.5 MG tablet Commonly known as:  XANAX Take 1 tablet (0.5 mg total) by mouth daily as needed for anxiety.   bisacodyl 5 MG EC tablet Commonly known as:  DULCOLAX Take 5 mg by mouth daily.   diclofenac sodium 1 % Gel Commonly known as:  VOLTAREN Apply 2 g topically 3 (three) times daily as needed. For hands, osteoarthritis   levothyroxine 100 MCG tablet Commonly known as:  SYNTHROID, LEVOTHROID Take 1 tablet (100 mcg total) by mouth daily.   metoprolol succinate 25 MG 24 hr tablet Commonly known as:  TOPROL-XL Take 1 tablet (25 mg total) by mouth daily.   MULTIPLE VITAMINS PO Take by mouth.   PARoxetine 20 MG tablet Commonly known as:  PAXIL Take 1 tablet (20 mg total) by mouth daily.   rifaximin 550 MG Tabs tablet Commonly known as:  XIFAXAN Take 550 mg by mouth 2 (two) times daily.   vitamin B-12 1000 MCG tablet Commonly known as:  CYANOCOBALAMIN Take 1 tablet (1,000 mcg total) by mouth daily.  Allergies:  Allergies  Allergen Reactions  . Epinephrine     Heart race  . Promethazine Hcl     hyperactivity  . Statins     Family History: Family History  Problem Relation Age of Onset  . Stroke Mother     Social History:  reports that she has never smoked. She has never used smokeless tobacco. She reports that she does not drink alcohol or use drugs.  ROS: UROLOGY Frequent Urination?: No Hard to postpone urination?: Yes Burning/pain with urination?: No Get up at night to urinate?: No Leakage of urine?: Yes Urine stream starts and stops?: No Trouble starting stream?: No Do you have to strain to urinate?: No Blood in urine?: No Urinary tract infection?: No Sexually transmitted disease?: No Injury to kidneys or bladder?: No Painful  intercourse?: No Weak stream?: No Currently pregnant?: No Vaginal bleeding?: No Last menstrual period?: n  Gastrointestinal Nausea?: No Vomiting?: No Indigestion/heartburn?: No Diarrhea?: No Constipation?: No  Constitutional Fever: No Night sweats?: Yes Weight loss?: No Fatigue?: No  Skin Skin rash/lesions?: No Itching?: No  Eyes Blurred vision?: No Double vision?: No  Ears/Nose/Throat Sore throat?: No Sinus problems?: No  Hematologic/Lymphatic Swollen glands?: No Easy bruising?: No  Cardiovascular Leg swelling?: No Chest pain?: No  Respiratory Cough?: No Shortness of breath?: No  Endocrine Excessive thirst?: No  Musculoskeletal Back pain?: Yes Joint pain?: Yes  Neurological Headaches?: No Dizziness?: No  Psychologic Depression?: No Anxiety?: No  Physical Exam: BP (!) 192/82   Pulse 70   Ht 5' 6"  (1.676 m)   Wt 217 lb 8 oz (98.7 kg)   BMI 35.11 kg/m   Constitutional:  Alert and oriented, No acute distress. HEENT: [place] AT, moist mucus membranes.  Trachea midline, no masses. Cardiovascular: No clubbing, cyanosis, or edema. Respiratory: Normal respiratory effort, no increased work of breathing. GI: Abdomen is soft, nontender, nondistended, no abdominal masses GU: No CVA tenderness.  Grade 1 hypermobility of the bladder neck and negative cough test with a moderate cough and no prolapse Skin: No rashes, bruises or suspicious lesions. Lymph: No cervical or inguinal adenopathy. Neurologic: Grossly intact, no focal deficits, moving all 4 extremities. Psychiatric: Normal mood and affect.  Laboratory Data: Lab Results  Component Value Date   WBC 5.4 11/29/2017   HGB 12.8 11/29/2017   HCT 39 11/29/2017   MCV 89.1 06/18/2017   PLT 172 11/29/2017    Lab Results  Component Value Date   CREATININE 1.0 11/29/2017    No results found for: PSA  No results found for: TESTOSTERONE  No results found for: HGBA1C  Urinalysis    Component Value  Date/Time   APPEARANCEUR Cloudy (A) 12/05/2015 1218   GLUCOSEU Negative 12/05/2015 1218   BILIRUBINUR negative 05/15/2018 1542   BILIRUBINUR Negative 12/05/2015 1218   PROTEINUR Negative 05/15/2018 1542   PROTEINUR Negative 12/05/2015 1218   UROBILINOGEN 0.2 05/15/2018 1542   NITRITE negative 05/15/2018 1542   NITRITE Negative 12/05/2015 1218   LEUKOCYTESUR Negative 05/15/2018 1542   LEUKOCYTESUR 1+ (A) 12/05/2015 1218    Pertinent Imaging:   Assessment & Plan: Clinically patient has urge incontinence nocturia.  Reassess in 6 weeks on Myrbetriq samples and prescription.  Send urine for culture.  1. Urinary incontinence, unspecified type  - Urinalysis, Complete   No follow-ups on file.  Reece Packer, MD  [facility] [address] [place], [place] [address] [phone]

## 2018-07-02 DIAGNOSIS — K7460 Unspecified cirrhosis of liver: Secondary | ICD-10-CM | POA: Diagnosis not present

## 2018-07-02 DIAGNOSIS — K7581 Nonalcoholic steatohepatitis (NASH): Secondary | ICD-10-CM | POA: Diagnosis not present

## 2018-07-09 DIAGNOSIS — M25551 Pain in right hip: Secondary | ICD-10-CM | POA: Diagnosis not present

## 2018-07-09 DIAGNOSIS — M25462 Effusion, left knee: Secondary | ICD-10-CM | POA: Diagnosis not present

## 2018-07-09 DIAGNOSIS — S82002A Unspecified fracture of left patella, initial encounter for closed fracture: Secondary | ICD-10-CM | POA: Diagnosis not present

## 2018-07-09 DIAGNOSIS — Z96652 Presence of left artificial knee joint: Secondary | ICD-10-CM | POA: Diagnosis not present

## 2018-07-09 DIAGNOSIS — S82032A Displaced transverse fracture of left patella, initial encounter for closed fracture: Secondary | ICD-10-CM | POA: Diagnosis not present

## 2018-07-09 DIAGNOSIS — W1839XD Other fall on same level, subsequent encounter: Secondary | ICD-10-CM | POA: Diagnosis not present

## 2018-07-09 DIAGNOSIS — M25552 Pain in left hip: Secondary | ICD-10-CM | POA: Diagnosis not present

## 2018-08-01 ENCOUNTER — Other Ambulatory Visit: Payer: Self-pay | Admitting: Urology

## 2018-08-01 DIAGNOSIS — R32 Unspecified urinary incontinence: Secondary | ICD-10-CM

## 2018-08-01 DIAGNOSIS — M2020 Hallux rigidus, unspecified foot: Secondary | ICD-10-CM | POA: Insufficient documentation

## 2018-08-01 NOTE — Telephone Encounter (Addendum)
Reggie called from [facility] and ask for a script for Myrbetria  Please fax to [phone]

## 2018-08-04 ENCOUNTER — Telehealth: Payer: Self-pay | Admitting: Urology

## 2018-08-04 NOTE — Telephone Encounter (Signed)
Informed font desk lady at [facility] that the RX was sent to [facility]. She will have patient's daughter pick up RX.

## 2018-08-04 NOTE — Telephone Encounter (Signed)
Anita Bennett from [facility] called the office today.  Patient is a resident at their facility.  They are going to contact [facility] to get her prescription transferred.  However, they need a signed order for Myrbetriq from Dr. Matilde Sprang.  Fax @ # [phone].  Call her with questions.

## 2018-08-05 NOTE — Telephone Encounter (Signed)
I spoke with front desk lady and informed her that Dr. Matilde Sprang will not be back in the office until Monday.

## 2018-08-06 NOTE — Telephone Encounter (Signed)
Santiago Glad called back asking for a copy of the script be faxed over to her today for the [facility]) 50 MG TB24 tablet @ [phone] so that they can fill it for her. She does not use a regular pharmacy.   Thanks, Sharyn Lull

## 2018-08-06 NOTE — Telephone Encounter (Signed)
Order for Myrbetriq faxed to [facility] at [phone].  Informed facility.

## 2018-08-11 ENCOUNTER — Ambulatory Visit: Payer: Medicare Other | Admitting: Urology

## 2018-08-11 ENCOUNTER — Encounter: Payer: Self-pay | Admitting: Urology

## 2018-09-24 ENCOUNTER — Ambulatory Visit: Payer: Medicare Other | Admitting: Family Medicine

## 2018-09-25 ENCOUNTER — Ambulatory Visit: Payer: Medicare Other | Admitting: Family Medicine

## 2018-10-03 ENCOUNTER — Other Ambulatory Visit: Payer: Self-pay | Admitting: Family Medicine

## 2018-12-31 ENCOUNTER — Telehealth: Payer: Self-pay | Admitting: Family Medicine

## 2018-12-31 NOTE — Chronic Care Management (AMB) (Signed)
Chronic Care Management   Note  12/31/2018 Name: AVENLY ROBERGE MRN: [id number] DOB: 1940-08-03  JANAYIA BURGGRAF is a 78 y.o. year old female who is a primary care patient of Olin Hauser, DO. I reached out to [name] by phone today in response to a referral sent by Ms. [employer] Seelman's health plan.    Ms. Wescott was given information about Chronic Care Management services today including:  1. CCM service includes personalized support from designated clinical staff supervised by her physician, including individualized plan of care and coordination with other care providers 2. 24/7 contact phone numbers for assistance for urgent and routine care needs. 3. Service will only be billed when office clinical staff spend 20 minutes or more in a month to coordinate care. 4. Only one practitioner may furnish and bill the service in a calendar month. 5. The patient may stop CCM services at any time (effective at the end of the month) by phone call to the office staff. 6. The patient will be responsible for cost sharing (co-pay) of up to 20% of the service fee (after annual deductible is met).  Patient agreed to services and verbal consent obtained.   Follow up plan: Telephone appointment with CCM team member scheduled for: 02/09/2019  [place]  [email or web address]   ??[phone]

## 2019-01-19 ENCOUNTER — Other Ambulatory Visit: Payer: Self-pay | Admitting: Physical Medicine and Rehabilitation

## 2019-01-19 DIAGNOSIS — M5416 Radiculopathy, lumbar region: Secondary | ICD-10-CM

## 2019-02-04 ENCOUNTER — Ambulatory Visit
Admission: RE | Admit: 2019-02-04 | Discharge: 2019-02-04 | Disposition: A | Discharge status: Home / Self Care | Payer: Medicare Other | Source: Ambulatory Visit | Attending: Physical Medicine and Rehabilitation | Admitting: Physical Medicine and Rehabilitation

## 2019-02-04 ENCOUNTER — Other Ambulatory Visit: Payer: Self-pay

## 2019-02-04 DIAGNOSIS — M5416 Radiculopathy, lumbar region: Secondary | ICD-10-CM | POA: Diagnosis not present

## 2019-02-06 ENCOUNTER — Telehealth: Payer: Self-pay

## 2019-02-06 NOTE — Telephone Encounter (Signed)
Kisha from [facility] called requesting a verbal order to get a urinalysis on patient. She is complaining of lower abdominal pain x 2 days. No urinary symptoms reported. She state the patient been refusing to take her Myrbetriq over the last month. Her blood pressure today was elevated 160/90.   [phone]

## 2019-02-06 NOTE — Telephone Encounter (Signed)
That is fine. Can provide them with verbal authorization to get urinalysis and urine culture for her.  They can notify her GI specialist at [facility] as well.  Lynford Citizen, NP  [place]  [place], [place] [address]  [phone]   If severe worsening, may seek care at hospital ED if need  Nobie Putnam, [facility] 02/06/2019, 12:50 PM

## 2019-02-06 NOTE — Telephone Encounter (Signed)
Detail message left on  [employer] as requested auth. The verbal order.

## 2019-02-09 ENCOUNTER — Telehealth: Payer: Medicare Other

## 2019-02-10 ENCOUNTER — Telehealth: Payer: Self-pay

## 2019-02-10 NOTE — Telephone Encounter (Signed)
Daughter called back and she has still not heard from the UA and patient is having fever, body aches since last week.  Please call daughter back

## 2019-02-10 NOTE — Telephone Encounter (Signed)
Anita Bennett from [facility] got verbal last week about U/A and urine culture spoke to her and she will let daughter know and fax [place] a test result. Patient was complaining for shoulder, knee pain advised her to schedule an appointment and it is scheduled for Friday 07/24 around 11:00 am.

## 2019-02-10 NOTE — Telephone Encounter (Signed)
Acknowledged  Nobie Putnam, DO [facility] 02/10/2019, 1:30 PM

## 2019-02-13 ENCOUNTER — Ambulatory Visit: Payer: Medicare Other | Admitting: Family Medicine

## 2019-03-23 ENCOUNTER — Telehealth: Payer: Self-pay

## 2019-04-14 ENCOUNTER — Telehealth: Payer: Self-pay | Admitting: Family Medicine

## 2019-04-14 DIAGNOSIS — K7581 Nonalcoholic steatohepatitis (NASH): Secondary | ICD-10-CM

## 2019-04-14 DIAGNOSIS — K7460 Unspecified cirrhosis of liver: Secondary | ICD-10-CM

## 2019-04-14 NOTE — Telephone Encounter (Signed)
Received fax order from [facility] for order updates. She declines to take Myrbetriq - and declines alternative. Also prefers to take Lactulose every other day since not needed daily now.  Nobie Putnam, [facility] 04/14/2019, 5:31 PM

## 2019-05-11 ENCOUNTER — Telehealth: Payer: Self-pay

## 2019-06-15 ENCOUNTER — Telehealth: Payer: Self-pay

## 2019-08-13 ENCOUNTER — Ambulatory Visit: Payer: Medicare Other | Attending: Internal Medicine

## 2019-08-13 DIAGNOSIS — Z20822 Contact with and (suspected) exposure to covid-19: Secondary | ICD-10-CM

## 2019-08-14 LAB — NOVEL CORONAVIRUS, NAA: SARS-CoV-2, NAA: NOT DETECTED

## 2019-08-14 LAB — SPECIMEN STATUS REPORT

## 2019-11-16 ENCOUNTER — Other Ambulatory Visit: Payer: Self-pay

## 2019-11-16 ENCOUNTER — Encounter: Payer: Self-pay | Admitting: Dermatology

## 2019-11-16 ENCOUNTER — Ambulatory Visit (INDEPENDENT_AMBULATORY_CARE_PROVIDER_SITE_OTHER): Payer: Medicare Other | Admitting: Dermatology

## 2019-11-16 DIAGNOSIS — L570 Actinic keratosis: Secondary | ICD-10-CM

## 2019-11-16 DIAGNOSIS — L820 Inflamed seborrheic keratosis: Secondary | ICD-10-CM | POA: Diagnosis not present

## 2019-11-16 DIAGNOSIS — L821 Other seborrheic keratosis: Secondary | ICD-10-CM | POA: Diagnosis not present

## 2019-11-16 DIAGNOSIS — L578 Other skin changes due to chronic exposure to nonionizing radiation: Secondary | ICD-10-CM

## 2019-11-16 NOTE — Patient Instructions (Signed)

## 2019-11-16 NOTE — Progress Notes (Addendum)
   Follow-Up Visit   Subjective  Anita Bennett is a 79 y.o. female who presents for the following: Other (Spot on nose x 6 months or more that comes and goes. Also has some scaly spots on cheeks.).    The following portions of the chart were reviewed this encounter and updated as appropriate:      Review of Systems:  No other skin or systemic complaints except as noted in HPI or Assessment and Plan.  Objective  Well appearing patient in no apparent distress; mood and affect are within normal limits.  A focused examination was performed including face. Relevant physical exam findings are noted in the Assessment and Plan.  Objective  Right Tip of Nose: 1.0 cm Erythematous thin papules/macules with gritty scale.   Objective  Left Temple, Right Temple: Erythematous keratotic or waxy stuck-on papule or plaque.    Assessment & Plan   Actinic Damage - diffuse scaly erythematous macules with underlying dyspigmentation - Recommend daily broad spectrum sunscreen SPF 30+ to sun-exposed areas, reapply every 2 hours as needed.  - Call for new or changing lesions.  Seborrheic Keratoses - Stuck-on, waxy, tan-brown papules and plaques  - Discussed benign etiology and prognosis. - Observe - Call for any changes    AK (actinic keratosis) Right Tip of Nose  Destruction of lesion - Right Tip of Nose Complexity: simple   Destruction method: cryotherapy   Informed consent: discussed and consent obtained   Timeout:  patient name, date of birth, surgical site, and procedure verified Lesion destroyed using liquid nitrogen: Yes   Region frozen until ice ball extended beyond lesion: Yes   Outcome: patient tolerated procedure well with no complications   Post-procedure details: wound care instructions given    Inflamed seborrheic keratosis (2) Left Temple; Right Temple  Destruction of lesion - Left Temple, Right Temple Complexity: simple   Destruction method: cryotherapy   Informed  consent: discussed and consent obtained   Timeout:  patient name, date of birth, surgical site, and procedure verified Lesion destroyed using liquid nitrogen: Yes   Region frozen until ice ball extended beyond lesion: Yes   Outcome: patient tolerated procedure well with no complications   Post-procedure details: wound care instructions given    Return in about 2 months (around 01/16/2020).  I, Ashok Cordia, CMA, am acting as scribe for Sarina Ser, MD .    Documentation: I have reviewed the above documentation for accuracy and completeness, and I agree with the above.  Sarina Ser, MD

## 2019-11-18 NOTE — Addendum Note (Signed)
Addended by: Ralene Bathe on: 11/18/2019 03:27 PM   Modules accepted: Level of Service

## 2020-02-10 ENCOUNTER — Ambulatory Visit: Payer: Medicare Other | Admitting: Dermatology

## 2020-04-26 ENCOUNTER — Ambulatory Visit: Payer: Medicare Other | Attending: Internal Medicine

## 2020-04-26 DIAGNOSIS — Z23 Encounter for immunization: Secondary | ICD-10-CM

## 2020-04-26 NOTE — Progress Notes (Signed)
   Covid-19 Vaccination Clinic  Name:  Anita Bennett    MRN: [id number] DOB: 1941-01-07  04/26/2020  Anita Bennett was observed post Covid-19 immunization for 15 minutes without incident. She was provided with Vaccine Information Sheet and instruction to access the V-Safe system.   Anita Bennett was instructed to call 911 with any severe reactions post vaccine: Marland Kitchen Difficulty breathing  . Swelling of face and throat  . A fast heartbeat  . A bad rash all over body  . Dizziness and weakness

## 2020-09-20 ENCOUNTER — Other Ambulatory Visit: Payer: Self-pay | Admitting: Family Medicine

## 2020-09-20 DIAGNOSIS — R410 Disorientation, unspecified: Secondary | ICD-10-CM

## 2020-09-20 DIAGNOSIS — R413 Other amnesia: Secondary | ICD-10-CM

## 2020-09-23 ENCOUNTER — Other Ambulatory Visit: Payer: Self-pay

## 2020-09-23 ENCOUNTER — Ambulatory Visit
Admission: RE | Admit: 2020-09-23 | Discharge: 2020-09-23 | Disposition: A | Discharge status: Home / Self Care | Payer: Medicare Other | Source: Ambulatory Visit | Attending: Family Medicine | Admitting: Family Medicine

## 2020-09-23 DIAGNOSIS — R410 Disorientation, unspecified: Secondary | ICD-10-CM | POA: Diagnosis present

## 2020-09-23 DIAGNOSIS — R413 Other amnesia: Secondary | ICD-10-CM | POA: Insufficient documentation

## 2020-09-28 ENCOUNTER — Other Ambulatory Visit: Payer: Self-pay | Admitting: Family Medicine

## 2020-09-28 DIAGNOSIS — R413 Other amnesia: Secondary | ICD-10-CM

## 2020-09-28 DIAGNOSIS — R410 Disorientation, unspecified: Secondary | ICD-10-CM

## 2020-10-20 ENCOUNTER — Ambulatory Visit
Admission: RE | Admit: 2020-10-20 | Discharge: 2020-10-20 | Disposition: A | Discharge status: Home / Self Care | Payer: Medicare Other | Source: Ambulatory Visit | Attending: Family Medicine | Admitting: Family Medicine

## 2020-10-20 ENCOUNTER — Other Ambulatory Visit: Payer: Self-pay

## 2020-10-20 DIAGNOSIS — R410 Disorientation, unspecified: Secondary | ICD-10-CM | POA: Diagnosis present

## 2020-10-20 DIAGNOSIS — R413 Other amnesia: Secondary | ICD-10-CM | POA: Insufficient documentation

## 2020-11-12 ENCOUNTER — Emergency Department: Payer: Medicare Other

## 2020-11-12 ENCOUNTER — Encounter: Payer: Self-pay | Admitting: Emergency Medicine

## 2020-11-12 ENCOUNTER — Other Ambulatory Visit: Payer: Self-pay

## 2020-11-12 ENCOUNTER — Emergency Department
Admission: EM | Admit: 2020-11-12 | Discharge: 2020-11-13 | Disposition: A | Discharge status: Home / Self Care | Payer: Medicare Other | Attending: Emergency Medicine | Admitting: Emergency Medicine

## 2020-11-12 DIAGNOSIS — Y9389 Activity, other specified: Secondary | ICD-10-CM | POA: Insufficient documentation

## 2020-11-12 DIAGNOSIS — E039 Hypothyroidism, unspecified: Secondary | ICD-10-CM | POA: Insufficient documentation

## 2020-11-12 DIAGNOSIS — S0990XA Unspecified injury of head, initial encounter: Secondary | ICD-10-CM | POA: Insufficient documentation

## 2020-11-12 DIAGNOSIS — W06XXXA Fall from bed, initial encounter: Secondary | ICD-10-CM | POA: Insufficient documentation

## 2020-11-12 DIAGNOSIS — Z85828 Personal history of other malignant neoplasm of skin: Secondary | ICD-10-CM | POA: Insufficient documentation

## 2020-11-12 DIAGNOSIS — Z96653 Presence of artificial knee joint, bilateral: Secondary | ICD-10-CM | POA: Diagnosis not present

## 2020-11-12 DIAGNOSIS — S20212A Contusion of left front wall of thorax, initial encounter: Secondary | ICD-10-CM | POA: Diagnosis not present

## 2020-11-12 DIAGNOSIS — I10 Essential (primary) hypertension: Secondary | ICD-10-CM | POA: Insufficient documentation

## 2020-11-12 DIAGNOSIS — M25511 Pain in right shoulder: Secondary | ICD-10-CM | POA: Insufficient documentation

## 2020-11-12 DIAGNOSIS — Z79899 Other long term (current) drug therapy: Secondary | ICD-10-CM | POA: Diagnosis not present

## 2020-11-12 DIAGNOSIS — S345XXA Injury of lumbar, sacral and pelvic sympathetic nerves, initial encounter: Secondary | ICD-10-CM | POA: Diagnosis present

## 2020-11-12 DIAGNOSIS — S32020A Wedge compression fracture of second lumbar vertebra, initial encounter for closed fracture: Secondary | ICD-10-CM | POA: Diagnosis not present

## 2020-11-12 LAB — URINALYSIS, COMPLETE (UACMP) WITH MICROSCOPIC
Bacteria, UA: NONE SEEN
Bilirubin Urine: NEGATIVE
Glucose, UA: NEGATIVE mg/dL
Hgb urine dipstick: NEGATIVE
Ketones, ur: NEGATIVE mg/dL
Leukocytes,Ua: NEGATIVE
Nitrite: NEGATIVE
Protein, ur: NEGATIVE mg/dL
Specific Gravity, Urine: 1.004 — ABNORMAL LOW (ref 1.005–1.030)
Squamous Epithelial / HPF: NONE SEEN (ref 0–5)
pH: 7 (ref 5.0–8.0)

## 2020-11-12 LAB — LIPASE, BLOOD: Lipase: 32 U/L (ref 11–51)

## 2020-11-12 LAB — HEPATIC FUNCTION PANEL
ALT: 15 U/L (ref 0–44)
AST: 29 U/L (ref 15–41)
Albumin: 3.6 g/dL (ref 3.5–5.0)
Alkaline Phosphatase: 50 U/L (ref 38–126)
Bilirubin, Direct: 0.3 mg/dL — ABNORMAL HIGH (ref 0.0–0.2)
Indirect Bilirubin: 1.3 mg/dL — ABNORMAL HIGH (ref 0.3–0.9)
Total Bilirubin: 1.6 mg/dL — ABNORMAL HIGH (ref 0.3–1.2)
Total Protein: 6.3 g/dL — ABNORMAL LOW (ref 6.5–8.1)

## 2020-11-12 LAB — CBC WITH DIFFERENTIAL/PLATELET
Abs Immature Granulocytes: 0.03 10*3/uL (ref 0.00–0.07)
Basophils Absolute: 0.1 10*3/uL (ref 0.0–0.1)
Basophils Relative: 1 %
Eosinophils Absolute: 0.3 10*3/uL (ref 0.0–0.5)
Eosinophils Relative: 5 %
HCT: 38 % (ref 36.0–46.0)
Hemoglobin: 12.4 g/dL (ref 12.0–15.0)
Immature Granulocytes: 1 %
Lymphocytes Relative: 25 %
Lymphs Abs: 1.4 10*3/uL (ref 0.7–4.0)
MCH: 30.5 pg (ref 26.0–34.0)
MCHC: 32.6 g/dL (ref 30.0–36.0)
MCV: 93.6 fL (ref 80.0–100.0)
Monocytes Absolute: 0.4 10*3/uL (ref 0.1–1.0)
Monocytes Relative: 7 %
Neutro Abs: 3.5 10*3/uL (ref 1.7–7.7)
Neutrophils Relative %: 61 %
Platelets: 141 10*3/uL — ABNORMAL LOW (ref 150–400)
RBC: 4.06 MIL/uL (ref 3.87–5.11)
RDW: 13.9 % (ref 11.5–15.5)
WBC: 5.6 10*3/uL (ref 4.0–10.5)
nRBC: 0 % (ref 0.0–0.2)

## 2020-11-12 LAB — BASIC METABOLIC PANEL
Anion gap: 9 (ref 5–15)
BUN: 10 mg/dL (ref 8–23)
CO2: 25 mmol/L (ref 22–32)
Calcium: 8.7 mg/dL — ABNORMAL LOW (ref 8.9–10.3)
Chloride: 108 mmol/L (ref 98–111)
Creatinine, Ser: 0.89 mg/dL (ref 0.44–1.00)
GFR, Estimated: >60 mL/min (ref >60)
Glucose, Bld: 101 mg/dL — ABNORMAL HIGH (ref 70–99)
Potassium: 3.5 mmol/L (ref 3.5–5.1)
Sodium: 142 mmol/L (ref 135–145)

## 2020-11-12 MED ORDER — CLONIDINE HCL 0.1 MG PO TABS
0.2000 mg | ORAL_TABLET | Freq: Once | ORAL | Status: AC
  Administered 2020-11-12: 0.2 mg via ORAL
  Filled 2020-11-12: qty 2

## 2020-11-12 NOTE — ED Notes (Signed)
ED Provider at bedside. 

## 2020-11-12 NOTE — Progress Notes (Signed)
Orthopedic Tech Progress Note Patient Details:  Anita Bennett 12-Mar-1941 [id number]  Patient ID: [name], female   DOB: 06-04-1941, 80 y.o.   MRN: [id number] Called order into [facility]anger  Karolee Stamps 11/12/2020, 11:49 PM

## 2020-11-12 NOTE — ED Notes (Addendum)
Per Ortho tech, brace cannot be placed unless pt is going to be d/c home. Dr Cherylann Banas aware.  Labs pending at this time.

## 2020-11-12 NOTE — ED Triage Notes (Signed)
Pt to ED via POV with c/o fall yesterday morning at approx 0530 per the daughter. Pt c/o L rib pain at this time. Pt's daughter reports increasing pain since the fall. Pt's daughter reports increasing falls since January.

## 2020-11-12 NOTE — ED Notes (Signed)
Secretary calling ortho rep for LSO brace. MD aware. Pt and family updated.

## 2020-11-12 NOTE — ED Notes (Signed)
Per daughter, pt had episode of incontinence prior to using toilet to collect specimen. Clothing and linens changed.

## 2020-11-12 NOTE — ED Notes (Signed)
Patient transported to CT 

## 2020-11-12 NOTE — ED Notes (Signed)
Pt c/o head, neck, back pain after falling yesterday morning.  Pt sts she was trying to pick up her dog and fell hitting posterior head on night stand. Denies LOC. Pt reports taking 1050m tylenol and ibuprofen, alternating for pain. Pt reports last dose this afternoon. Pt reports problems with memory. Pt is A&Ox4.  Hx of R shoulder injury and family states her ROM has been limited since then. Pt sts she is unsure if pain is worse than before.

## 2020-11-12 NOTE — ED Provider Notes (Signed)
[facility] Emergency Department Provider Note ____________________________________________   Event Date/Time   First MD Initiated Contact with Patient 11/12/20 1807     (approximate)  I have reviewed the triage vital signs and the nursing notes.   HISTORY  Chief Complaint Fall    HPI Anita Bennett is a 80 y.o. female with PMH as noted below who presents with left rib, right shoulder, and back pain after fall yesterday.  The patient states that she was getting out of bed early yesterday morning when she was trying to hold her dog and lost her balance and fell.  She is not sure exactly what she had on the ground but felt that she twisted her torso.  She has had pain in the left upper rib area since that time along with the mid and lower back and the right shoulder.  She did hit her head but did not lose consciousness.  She has not had any nausea or vomiting but does report a mild headache.  The patient's daughter, who is a Marine scientist, states that she made the patient come in today because she felt that the patient was "not herself" and had relatively severe pain that was not controlled by Tylenol or ibuprofen.  Past Medical History:  Diagnosis Date  . Arrhythmia   . Basal cell carcinoma 01/31/2009   right prox dorsum forearm  . H/O knee surgery    2011 [place] knee (replacement)  . Heart murmur   . History of kidney stones   . History of toe surgery    12/01/14 [facility]  . Hyperlipidemia   . Thyroid disease     Patient Active Problem List   Diagnosis Date Noted  . Acquired hallux rigidus 08/01/2018  . Enthesopathy of right hip 04/03/2018  . Closed transverse fracture of patella with malunion, left 01/21/2018  . Posterior tibial tendinitis of left leg 01/21/2018  . S/P left unicompartmental knee replacement 12/19/2017  . H/O iron deficiency anemia 05/29/2017  . Impaired glucose tolerance 08/14/2016  . Acute cystitis without hematuria 07/04/2016  .  Incomplete emptying of bladder 02/02/2016  . Urinary hesitancy 02/02/2016  . Increased frequency of urination 01/31/2016  . Aortic heart murmur on examination 12/06/2015  . Urinary incontinence, mixed 12/06/2015  . Gait instability 12/06/2015  . Memory loss 05/03/2015  . Allergic rhinitis 05/02/2015  . HLD (hyperlipidemia) 05/02/2015  . Essential hypertension 05/02/2015  . Adult hypothyroidism 05/02/2015  . Malaise and fatigue 05/02/2015  . Mild mitral regurgitation by prior echocardiogram 05/02/2015  . Obesity (BMI 35.0-39.9 without comorbidity) 05/02/2015  . Osteoarthritis, multiple sites 05/02/2015  . Anxiety 01/27/2015  . Liver cirrhosis secondary to NASH (nonalcoholic steatohepatitis) (Combine) 01/12/2015  . Hammer toe 11/18/2014  . Hallux rigidus of right foot 11/18/2014  . Hallux rigidus of left foot 11/18/2014  . Chronic right shoulder pain 10/26/2014  . Peripheral nerve disease 10/22/2012    Past Surgical History:  Procedure Laterality Date  . arm surgery    . BACK SURGERY    . CHOLECYSTECTOMY    . TOE SURGERY    . TOTAL ABDOMINAL HYSTERECTOMY    . TOTAL KNEE ARTHROPLASTY Bilateral     Prior to Admission medications   Medication Sig Start Date End Date Taking? Authorizing Provider  traMADol (ULTRAM) 50 MG tablet Take 1 tablet (50 mg total) by mouth every 6 (six) hours as needed for severe pain. 11/13/20 11/13/21 Yes Arta Silence, MD  acetaminophen (TYLENOL) 500 MG tablet Take 500 mg  by mouth every 6 (six) hours as needed.    [provider]  ALPRAZolam Duanne Moron) 0.5 MG tablet Take 1 tablet (0.5 mg total) by mouth daily as needed for anxiety. 06/16/18   Karamalegos, Devonne Doughty, DO  diclofenac sodium (VOLTAREN) 1 % GEL Apply 2 g topically 3 (three) times daily as needed. For hands, osteoarthritis 03/18/18   Olin Hauser, DO  Lactulose 20 GM/30ML SOLN Take one dose every other day 04/14/19   Olin Hauser, DO  levothyroxine (SYNTHROID,  LEVOTHROID) 100 MCG tablet Take 1 tablet (100 mcg total) by mouth daily. 03/20/18   Karamalegos, Devonne Doughty, DO  metoprolol succinate (TOPROL-XL) 25 MG 24 hr tablet Take 1 tablet (25 mg total) by mouth daily. 03/20/18   Karamalegos, Devonne Doughty, DO  MULTIPLE VITAMINS PO Take by mouth.    [provider]  MYRBETRIQ 50 MG TB24 tablet Take 50 mg by mouth daily. 11/02/19   [provider]  oxyCODONE (OXY IR/ROXICODONE) 5 MG immediate release tablet oxycodone 5 mg tablet    [provider]  PARoxetine (PAXIL) 20 MG tablet Take 1 tablet (20 mg total) by mouth daily. 03/20/18   Karamalegos, Devonne Doughty, DO  polyethylene glycol (MIRALAX / GLYCOLAX) packet Take 17 g by mouth daily as needed for moderate constipation. Self admin may have in room 10/03/18   Parks Ranger, Devonne Doughty, DO  rifaximin (XIFAXAN) 550 MG TABS tablet Take 550 mg by mouth 2 (two) times daily.    [provider]  vitamin B-12 (CYANOCOBALAMIN) 1000 MCG tablet Take 1 tablet (1,000 mcg total) by mouth daily. 03/18/18   Karamalegos, Devonne Doughty, DO    Allergies Epinephrine, Promethazine hcl, and Statins  Family History  Problem Relation Age of Onset  . Stroke Mother     Social History Social History   Tobacco Use  . Smoking status: Never Smoker  . Smokeless tobacco: Never Used  Vaping Use  . Vaping Use: Never used  Substance Use Topics  . Alcohol use: No  . Drug use: No    Review of Systems  Constitutional: No fever. Eyes: No redness. ENT: No sore throat. Cardiovascular: Denies chest pain. Respiratory: Denies shortness of breath. Gastrointestinal: No vomiting or diarrhea.  Genitourinary: Negative for dysuria.  Musculoskeletal: Positive for back pain. Skin: Negative for rash. Neurological: Positive for mild headache.   ____________________________________________   PHYSICAL EXAM:  VITAL SIGNS: ED Triage Vitals  Enc Vitals Group     BP 11/12/20 1802 (!) 206/75     Pulse Rate  11/12/20 1802 64     Resp 11/12/20 1802 (!) 22     Temp 11/12/20 1802 99.3 F (37.4 C)     Temp Source 11/12/20 1802 Oral     SpO2 11/12/20 1802 95 %     Weight 11/12/20 1800 222 lb (100.7 kg)     Height 11/12/20 1800 5' 6"  (1.676 m)     Head Circumference --      Peak Flow --      Pain Score 11/12/20 1800 10     Pain Loc --      Pain Edu? --      Excl. in [place]? --     Constitutional: Alert and oriented. Well appearing for age and in no acute distress. Eyes: Conjunctivae are normal.  EOMI.  PERRLA. Head: Atraumatic. Nose: No congestion/rhinnorhea. Mouth/Throat: Mucous membranes are moist.   Neck: Normal range of motion.  Mild bilateral paraspinal tenderness. Cardiovascular: Normal rate, regular rhythm.  Good peripheral circulation. Respiratory: Normal respiratory effort.  No retractions.  Gastrointestinal: Soft and nontender. No distention.  Genitourinary: No flank tenderness. Musculoskeletal: No lower extremity edema.  Extremities warm and well perfused.  Mild thoracic and lumbar midline spinal tenderness with no step-off or crepitus.  Moderate left anterior lower rib area tenderness.  Pain on range of motion of right shoulder.  Intact distal pulses in all extremities. Neurologic:  Normal speech and language.  Motor intact in all extremities.  Normal coordination. Skin:  Skin is warm and dry. No rash noted. Psychiatric: Mood and affect are normal. Speech and behavior are normal.  ____________________________________________   LABS (all labs ordered are listed, but only abnormal results are displayed)  Labs Reviewed  BASIC METABOLIC PANEL - Abnormal; Notable for the following components:      Result Value   Glucose, Bld 101 (*)    Calcium 8.7 (*)    All other components within normal limits  CBC WITH DIFFERENTIAL/PLATELET - Abnormal; Notable for the following components:   Platelets 141 (*)    All other components within normal limits  HEPATIC FUNCTION PANEL - Abnormal;  Notable for the following components:   Total Protein 6.3 (*)    Total Bilirubin 1.6 (*)    Bilirubin, Direct 0.3 (*)    Indirect Bilirubin 1.3 (*)    All other components within normal limits  URINALYSIS, COMPLETE (UACMP) WITH MICROSCOPIC - Abnormal; Notable for the following components:   Color, Urine STRAW (*)    APPearance CLEAR (*)    Specific Gravity, Urine 1.004 (*)    All other components within normal limits  LIPASE, BLOOD   ____________________________________________  EKG   ____________________________________________  RADIOLOGY  CT head: No ICH or other acute findings CT cervical spine: No acute fracture CT thoracic spine: No acute fracture CT lumbar spine: L2 compression fracture XR L ribs: No acute fracture XR R shoulder: No acute fracture  ____________________________________________   PROCEDURES  Procedure(s) performed: No  Procedures  Critical Care performed: No ____________________________________________   INITIAL IMPRESSION / ASSESSMENT AND PLAN / ED COURSE  Pertinent labs & imaging results that were available during my care of the patient were reviewed by me and considered in my medical decision making (see chart for details).  80 year old female with PMH as noted above presents with pain in the left anterior rib area, back, and right shoulder after falling from her bed and hitting her head yesterday morning.  She has some history of memory loss and the daughter reports that she is "not herself" although not markedly altered from baseline.  On exam, the patient is well-appearing.  Her vital signs are normal except for hypertension.  The patient states that she takes her hypertensive medications in the evening, so has not yet taken them for today.  Neurologic exam is nonfocal.  She has tenderness in the left anterior rib area and some midline spinal tenderness.  Exam is otherwise as described above.  Overall I suspect most likely left rib  contusion versus fracture.  I have a low suspicion for spinal fracture.  Given that the patient fell yesterday and has no neurologic deficits or severe headache, I do not suspect ICH although based on shared decision making with the patient and her daughter and given her history of memory loss we will obtain a CT to rule out TBI.  At this time, the patient has no other symptoms besides the various areas of pain from the fall, so there is no  indication for lab work-up.  ----------------------------------------- 8:41 PM on 11/12/2020 -----------------------------------------  Imaging reveals no bony injury to the right shoulder, left ribs, cervical, or thoracic spine.  CT head is also negative.  Lumbar spine CT shows an L2 compression fracture.  I discussed the findings with Dr. Lacinda Axon.  He recommends an LSO brace, with a plan for follow-up in 2 weeks.  The LSO brace has been ordered.  Thoracic spine CT showed a small left pleural effusion and interstitial markings suggesting possible edema.  The patient has a history of cirrhosis, so I suspect that this is the likely etiology.  She has no shortness of breath or other respiratory symptoms and no hypoxia, so there is no indication for further ED work-up.  ----------------------------------------- 12:28 AM on 11/13/2020 -----------------------------------------  The patient started to report urinary frequency and a sensation of bloating in her abdomen.  She continued to have no abdominal pain, and abdomen remained soft and nontender on repeat exam.  I obtained lab work, and urinalysis all of which are within normal limits.  There is no evidence of UTI or other acute urinary or intra-abdominal process.  This delayed the LSO brace, but it is now pending.  The patient also had significant hypertension as she had not yet taken her antihypertensive medications.  We gave a dose of clonidine and the hypertension has significantly improved.  Plan will be  for discharge home once the brace is applied.  I signed the patient out to the oncoming ED physician Dr. Leonides Schanz.   ____________________________________________   FINAL CLINICAL IMPRESSION(S) / ED DIAGNOSES  Final diagnoses:  Compression fracture of L2 vertebra, initial encounter ([place])  Contusion of rib on left side, initial encounter  Minor head injury, initial encounter      NEW MEDICATIONS STARTED DURING THIS VISIT:  New Prescriptions   TRAMADOL (ULTRAM) 50 MG TABLET    Take 1 tablet (50 mg total) by mouth every 6 (six) hours as needed for severe pain.     Note:  This document was prepared using Dragon voice recognition software and may include unintentional dictation errors.   Arta Silence, MD 11/13/20 [phone]

## 2020-11-12 NOTE — ED Notes (Signed)
Ortho tech notified that pt is going to be d/c home.

## 2020-11-13 DIAGNOSIS — S32020A Wedge compression fracture of second lumbar vertebra, initial encounter for closed fracture: Secondary | ICD-10-CM | POA: Diagnosis not present

## 2020-11-13 MED ORDER — TRAMADOL HCL 50 MG PO TABS: 50.0000 mg | ORAL_TABLET | Freq: Four times a day (QID) | ORAL | 0 refills | Status: AC | PRN

## 2020-11-13 MED ORDER — TRAMADOL HCL 50 MG PO TABS
50.0000 mg | ORAL_TABLET | Freq: Once | ORAL | Status: AC
  Administered 2020-11-13: 50 mg via ORAL
  Filled 2020-11-13: qty 1

## 2020-11-13 NOTE — Discharge Instructions (Signed)
Wear the LSO brace at all times except when showering.  You may take the tramadol as needed for pain.  Follow-up with Dr. Lacinda Axon from neurosurgery in 2 weeks, and with your primary care doctor.  Return to the ER for new, worsening, or persistent severe back, rib, or abdominal pain, weakness or numbness, difficulty walking, urinary incontinence, or any other new or worsening symptoms that concern you.

## 2020-11-13 NOTE — ED Notes (Signed)
Pt ambulated with 1 person assist, gait steady. Pt return demonstrated application and adjustment of LSO brace. Daughter at bedside participated in education.

## 2020-11-13 NOTE — ED Provider Notes (Signed)
  Physical Exam  BP (!) 165/65   Pulse 63   Temp 99.3 F (37.4 C) (Oral)   Resp 18   Ht 5' 6"  (1.676 m)   Wt 100.7 kg   SpO2 93%   BMI 35.83 kg/m   Physical Exam  ED Course/Procedures     Procedures  MDM  12:00 AM  Assumed care.  Patient is an 80 year old female who had a fall yesterday.  Found to have an L2 compression fracture.  Dr. Lacinda Axon with neurosurgery consulted.  Recommends TLSO brace.  Will reassess after brace has been placed and ambulate patient.    1:34 AM  Pt also brace.  Tramadol has been sent to her pharmacy.  She is given given outpatient follow-up with Dr. Lacinda Axon.  Discussed return precautions and supportive care instructions.  Patient and family at bedside verbalized understanding.  Will discharge home.   At this time, I do not feel there is any life-threatening condition present. I have reviewed, interpreted and discussed all results (EKG, imaging, lab, urine as appropriate) and exam findings with patient/family. I have reviewed nursing notes and appropriate previous records.  I feel the patient is safe to be discharged home without further emergent workup and can continue workup as an outpatient as needed. Discussed usual and customary return precautions. Patient/family verbalize understanding and are comfortable with this plan.  Outpatient follow-up has been provided as needed. All questions have been answered.    Anita Bennett, Delice Bison, DO 11/13/20 [phone]

## 2021-03-07 ENCOUNTER — Other Ambulatory Visit
Admission: RE | Admit: 2021-03-07 | Discharge: 2021-03-07 | Disposition: A | Discharge status: Home / Self Care | Payer: Medicare Other | Source: Ambulatory Visit | Attending: Physician Assistant | Admitting: Physician Assistant

## 2021-03-07 DIAGNOSIS — R609 Edema, unspecified: Secondary | ICD-10-CM | POA: Diagnosis present

## 2021-03-07 DIAGNOSIS — Z79899 Other long term (current) drug therapy: Secondary | ICD-10-CM | POA: Insufficient documentation

## 2021-03-07 DIAGNOSIS — R0602 Shortness of breath: Secondary | ICD-10-CM | POA: Diagnosis present

## 2021-03-07 LAB — BRAIN NATRIURETIC PEPTIDE: B Natriuretic Peptide: 420.6 pg/mL — ABNORMAL HIGH (ref 0.0–100.0)

## 2021-03-12 ENCOUNTER — Emergency Department
Admission: EM | Admit: 2021-03-12 | Discharge: 2021-03-12 | Disposition: A | Discharge status: Home / Self Care | Payer: Medicare Other

## 2021-03-16 ENCOUNTER — Other Ambulatory Visit (HOSPITAL_BASED_OUTPATIENT_CLINIC_OR_DEPARTMENT_OTHER): Payer: Self-pay | Admitting: Family Medicine

## 2021-03-16 ENCOUNTER — Other Ambulatory Visit: Payer: Self-pay | Admitting: Family Medicine

## 2021-03-16 DIAGNOSIS — R410 Disorientation, unspecified: Secondary | ICD-10-CM

## 2021-03-16 DIAGNOSIS — R42 Dizziness and giddiness: Secondary | ICD-10-CM

## 2021-03-16 DIAGNOSIS — R519 Headache, unspecified: Secondary | ICD-10-CM

## 2021-03-22 ENCOUNTER — Ambulatory Visit
Admission: RE | Admit: 2021-03-22 | Discharge: 2021-03-22 | Disposition: A | Discharge status: Home / Self Care | Payer: Medicare Other | Source: Ambulatory Visit | Attending: Family Medicine | Admitting: Family Medicine

## 2021-03-22 DIAGNOSIS — R519 Headache, unspecified: Secondary | ICD-10-CM | POA: Insufficient documentation

## 2021-03-22 DIAGNOSIS — R42 Dizziness and giddiness: Secondary | ICD-10-CM | POA: Diagnosis present

## 2021-03-22 DIAGNOSIS — R410 Disorientation, unspecified: Secondary | ICD-10-CM | POA: Diagnosis present

## 2021-03-25 ENCOUNTER — Ambulatory Visit: Payer: Medicare Other

## 2021-04-30 ENCOUNTER — Emergency Department: Payer: Medicare Other

## 2021-04-30 ENCOUNTER — Encounter: Payer: Self-pay | Admitting: Emergency Medicine

## 2021-04-30 ENCOUNTER — Emergency Department
Admission: EM | Admit: 2021-04-30 | Discharge: 2021-04-30 | Disposition: A | Discharge status: Home / Self Care | Payer: Medicare Other | Attending: Emergency Medicine | Admitting: Emergency Medicine

## 2021-04-30 ENCOUNTER — Other Ambulatory Visit: Payer: Self-pay

## 2021-04-30 DIAGNOSIS — R0602 Shortness of breath: Secondary | ICD-10-CM | POA: Diagnosis present

## 2021-04-30 DIAGNOSIS — Z79899 Other long term (current) drug therapy: Secondary | ICD-10-CM | POA: Insufficient documentation

## 2021-04-30 DIAGNOSIS — I10 Essential (primary) hypertension: Secondary | ICD-10-CM | POA: Insufficient documentation

## 2021-04-30 DIAGNOSIS — U071 COVID-19: Secondary | ICD-10-CM | POA: Diagnosis not present

## 2021-04-30 DIAGNOSIS — R42 Dizziness and giddiness: Secondary | ICD-10-CM | POA: Insufficient documentation

## 2021-04-30 DIAGNOSIS — E039 Hypothyroidism, unspecified: Secondary | ICD-10-CM | POA: Diagnosis not present

## 2021-04-30 DIAGNOSIS — Z96653 Presence of artificial knee joint, bilateral: Secondary | ICD-10-CM | POA: Diagnosis not present

## 2021-04-30 DIAGNOSIS — R051 Acute cough: Secondary | ICD-10-CM

## 2021-04-30 LAB — PROCALCITONIN: Procalcitonin: <0.1 ng/mL

## 2021-04-30 LAB — CBC WITH DIFFERENTIAL/PLATELET
Abs Immature Granulocytes: 0.01 10*3/uL (ref 0.00–0.07)
Basophils Absolute: 0.1 10*3/uL (ref 0.0–0.1)
Basophils Relative: 1 %
Eosinophils Absolute: 0.3 10*3/uL (ref 0.0–0.5)
Eosinophils Relative: 8 %
HCT: 34 % — ABNORMAL LOW (ref 36.0–46.0)
Hemoglobin: 11.7 g/dL — ABNORMAL LOW (ref 12.0–15.0)
Immature Granulocytes: 0 %
Lymphocytes Relative: 31 %
Lymphs Abs: 1.3 10*3/uL (ref 0.7–4.0)
MCH: 32.4 pg (ref 26.0–34.0)
MCHC: 34.4 g/dL (ref 30.0–36.0)
MCV: 94.2 fL (ref 80.0–100.0)
Monocytes Absolute: 0.4 10*3/uL (ref 0.1–1.0)
Monocytes Relative: 8 %
Neutro Abs: 2.2 10*3/uL (ref 1.7–7.7)
Neutrophils Relative %: 52 %
Platelets: 179 10*3/uL (ref 150–400)
RBC: 3.61 MIL/uL — ABNORMAL LOW (ref 3.87–5.11)
RDW: 13.5 % (ref 11.5–15.5)
WBC: 4.2 10*3/uL (ref 4.0–10.5)
nRBC: 0 % (ref 0.0–0.2)

## 2021-04-30 LAB — COMPREHENSIVE METABOLIC PANEL
ALT: 19 U/L (ref 0–44)
AST: 37 U/L (ref 15–41)
Albumin: 3.8 g/dL (ref 3.5–5.0)
Alkaline Phosphatase: 58 U/L (ref 38–126)
Anion gap: 7 (ref 5–15)
BUN: 10 mg/dL (ref 8–23)
CO2: 26 mmol/L (ref 22–32)
Calcium: 8.6 mg/dL — ABNORMAL LOW (ref 8.9–10.3)
Chloride: 107 mmol/L (ref 98–111)
Creatinine, Ser: 0.92 mg/dL (ref 0.44–1.00)
GFR, Estimated: >60 mL/min (ref >60)
Glucose, Bld: 97 mg/dL (ref 70–99)
Potassium: 3.6 mmol/L (ref 3.5–5.1)
Sodium: 140 mmol/L (ref 135–145)
Total Bilirubin: 1.3 mg/dL — ABNORMAL HIGH (ref 0.3–1.2)
Total Protein: 6.9 g/dL (ref 6.5–8.1)

## 2021-04-30 LAB — BRAIN NATRIURETIC PEPTIDE: B Natriuretic Peptide: 363.8 pg/mL — ABNORMAL HIGH (ref 0.0–100.0)

## 2021-04-30 LAB — TROPONIN I (HIGH SENSITIVITY): Troponin I (High Sensitivity): 18 ng/L — ABNORMAL HIGH (ref <18)

## 2021-04-30 LAB — LACTIC ACID, PLASMA: Lactic Acid, Venous: 1.1 mmol/L (ref 0.5–1.9)

## 2021-04-30 LAB — RESP PANEL BY RT-PCR (FLU A&B, COVID) ARPGX2
Influenza A by PCR: NEGATIVE
Influenza B by PCR: NEGATIVE
SARS Coronavirus 2 by RT PCR: POSITIVE — AB

## 2021-04-30 MED ORDER — NIRMATRELVIR/RITONAVIR (PAXLOVID)TABLET: 3.0000 | ORAL_TABLET | Freq: Two times a day (BID) | ORAL | 0 refills | Status: AC

## 2021-04-30 MED ORDER — BENZONATATE 100 MG PO CAPS: 100.0000 mg | ORAL_CAPSULE | Freq: Three times a day (TID) | ORAL | 0 refills | Status: AC | PRN

## 2021-04-30 NOTE — ED Notes (Signed)
Patient transported to X-ray 

## 2021-04-30 NOTE — ED Notes (Signed)
Returns fron xray

## 2021-04-30 NOTE — ED Notes (Signed)
Patient and daughter made aware of COVID positive results. Daughter provided with proper PPE.

## 2021-04-30 NOTE — ED Notes (Signed)
Had let patient know that we would be getting her up to walk and see how her oxygen saturations held while walking. When NA went in to walk patient, she stated she was too dizzy, had a headache and other excuses. Patient then stated they wanted to leave because they felt the doctor was not communicating with them and they didn't know what was going on. Patient remonded of previous conversation had and expectations we had.

## 2021-04-30 NOTE — Discharge Instructions (Addendum)
Please return for increasing shortness of breath dizziness or any other problems.  Please follow-up with your regular doctor in the next week or so.  Take the Tessalon Perles 3 times a day as needed for cough.  Be careful not to suck on them and take them and swallow them.  If you suck on them they can make you very sick.  Take the Paxlovid anti-COVID medication as directed.  Again please do not hesitate to return for shortness of breath or worsening dizziness or anything else.

## 2021-04-30 NOTE — ED Triage Notes (Signed)
Pt via EMS from home. Pt c/o SOB, cough, and nasal congestion for the past 2 weeks. Pt took an at home COVID test that was negative. Denies pain. Denies chills/fever. Pt is A&Ox4 and NAD.

## 2021-04-30 NOTE — ED Notes (Addendum)
Patient presented to the desk fully dressed stating she was ready to go. Dr. Cinda Quest walked up and gave daughter discharge instructions. Ii took patient back into the room to remove her IV. Wheelchair provided for patient. Unable to obtain another set of vital signs.

## 2021-04-30 NOTE — ED Notes (Signed)
While ambulating in room with assistance patients oxygen saturation stayed between 97-98. Patient c/o dizziness but no shortness of breath. Patient states she want to go home.

## 2021-04-30 NOTE — ED Provider Notes (Signed)
[facility] Emergency Department Provider Note  ____________________________________________   Event Date/Time   First MD Initiated Contact with Patient 04/30/21 1132     (approximate)  I have reviewed the triage vital signs and the nursing notes.   HISTORY  Chief Complaint Cough and Shortness of Breath   HPI Anita Bennett is a 80 y.o. female who comes from home complaining of increasing cough.  She took a home COVID test yesterday that was positive.  She is not short of breath.  She reports she is always a little dizzy when she stands up and her blood pressure goes up when she stands up and walks.  But she does not, she says, get short of breath with walking.  She has 2 family members at home, her daughters, who are nurses.         Past Medical History:  Diagnosis Date   Arrhythmia    Basal cell carcinoma 01/31/2009   right prox dorsum forearm   H/O knee surgery    2011 [place] knee (replacement)   Heart murmur    History of kidney stones    History of toe surgery    12/01/14 [facility]   Hyperlipidemia    Thyroid disease     Patient Active Problem List   Diagnosis Date Noted   Acquired hallux rigidus 08/01/2018   Enthesopathy of right hip 04/03/2018   Closed transverse fracture of patella with malunion, left 01/21/2018   Posterior tibial tendinitis of left leg 01/21/2018   S/P left unicompartmental knee replacement 12/19/2017   H/O iron deficiency anemia 05/29/2017   Impaired glucose tolerance 08/14/2016   Acute cystitis without hematuria 07/04/2016   Incomplete emptying of bladder 02/02/2016   Urinary hesitancy 02/02/2016   Increased frequency of urination 01/31/2016   Aortic heart murmur on examination 12/06/2015   Urinary incontinence, mixed 12/06/2015   Gait instability 12/06/2015   Memory loss 05/03/2015   Allergic rhinitis 05/02/2015   HLD (hyperlipidemia) 05/02/2015   Essential hypertension 05/02/2015   Adult hypothyroidism  05/02/2015   Malaise and fatigue 05/02/2015   Mild mitral regurgitation by prior echocardiogram 05/02/2015   Obesity (BMI 35.0-39.9 without comorbidity) 05/02/2015   Osteoarthritis, multiple sites 05/02/2015   Anxiety 01/27/2015   Liver cirrhosis secondary to NASH (nonalcoholic steatohepatitis) ([place]) 01/12/2015   Hammer toe 11/18/2014   Hallux rigidus of right foot 11/18/2014   Hallux rigidus of left foot 11/18/2014   Chronic right shoulder pain 10/26/2014   Peripheral nerve disease 10/22/2012    Past Surgical History:  Procedure Laterality Date   arm surgery     BACK SURGERY     CHOLECYSTECTOMY     TOE SURGERY     TOTAL ABDOMINAL HYSTERECTOMY     TOTAL KNEE ARTHROPLASTY Bilateral     Prior to Admission medications   Medication Sig Start Date End Date Taking? Authorizing Provider  benzonatate (TESSALON PERLES) 100 MG capsule Take 1 capsule (100 mg total) by mouth 3 (three) times daily as needed for cough. 04/30/21 04/30/22 Yes Nena Polio, MD  nirmatrelvir/ritonavir EUA (PAXLOVID) 20 x 150 MG & 10 x 100MG TABS Take 3 tablets by mouth 2 (two) times daily for 5 days. Patient GFR is >60 Take nirmatrelvir (150 mg) two tablets twice daily for 5 days and ritonavir (100 mg) one tablet twice daily for 5 days. 04/30/21 05/05/21 Yes Nena Polio, MD  acetaminophen (TYLENOL) 500 MG tablet Take 500 mg by mouth every 6 (six) hours as needed.  [provider]  ALPRAZolam Duanne Moron) 0.5 MG tablet Take 1 tablet (0.5 mg total) by mouth daily as needed for anxiety. 06/16/18   Karamalegos, Devonne Doughty, DO  diclofenac sodium (VOLTAREN) 1 % GEL Apply 2 g topically 3 (three) times daily as needed. For hands, osteoarthritis 03/18/18   Olin Hauser, DO  Lactulose 20 GM/30ML SOLN Take one dose every other day 04/14/19   Olin Hauser, DO  levothyroxine (SYNTHROID, LEVOTHROID) 100 MCG tablet Take 1 tablet (100 mcg total) by mouth daily. 03/20/18   Karamalegos, Devonne Doughty, DO   metoprolol succinate (TOPROL-XL) 25 MG 24 hr tablet Take 1 tablet (25 mg total) by mouth daily. 03/20/18   Karamalegos, Devonne Doughty, DO  MULTIPLE VITAMINS PO Take by mouth.    [provider]  MYRBETRIQ 50 MG TB24 tablet Take 50 mg by mouth daily. 11/02/19   [provider]  oxyCODONE (OXY IR/ROXICODONE) 5 MG immediate release tablet oxycodone 5 mg tablet    [provider]  PARoxetine (PAXIL) 20 MG tablet Take 1 tablet (20 mg total) by mouth daily. 03/20/18   Karamalegos, Devonne Doughty, DO  polyethylene glycol (MIRALAX / GLYCOLAX) packet Take 17 g by mouth daily as needed for moderate constipation. Self admin may have in room 10/03/18   Parks Ranger, Devonne Doughty, DO  rifaximin (XIFAXAN) 550 MG TABS tablet Take 550 mg by mouth 2 (two) times daily.    [provider]  traMADol (ULTRAM) 50 MG tablet Take 1 tablet (50 mg total) by mouth every 6 (six) hours as needed for severe pain. 11/13/20 11/13/21  Arta Silence, MD  vitamin B-12 (CYANOCOBALAMIN) 1000 MCG tablet Take 1 tablet (1,000 mcg total) by mouth daily. 03/18/18   Karamalegos, Devonne Doughty, DO    Allergies Epinephrine, Promethazine hcl, and Statins  Family History  Problem Relation Age of Onset   Stroke Mother     Social History Social History   Tobacco Use   Smoking status: Never   Smokeless tobacco: Never  Vaping Use   Vaping Use: Never used  Substance Use Topics   Alcohol use: No   Drug use: No    Review of Systems  Constitutional: No fever/chills Eyes: No visual changes. ENT: No sore throat. Cardiovascular: Denies chest pain. Respiratory: Denies shortness of breath. Gastrointestinal: No abdominal pain.  No nausea, no vomiting.  No diarrhea.  No constipation. Genitourinary: Negative for dysuria. Musculoskeletal: Negative for back pain. Skin: Negative for rash. Neurological: Negative for headaches, focal weakness   ____________________________________________   PHYSICAL  EXAM:  VITAL SIGNS: ED Triage Vitals  Enc Vitals Group     BP 04/30/21 1114 (!) 190/72     Pulse Rate 04/30/21 1114 60     Resp 04/30/21 1114 18     Temp 04/30/21 1114 98.6 F (37 C)     Temp Source 04/30/21 1114 Oral     SpO2 04/30/21 1114 98 %     Weight 04/30/21 1115 211 lb (95.7 kg)     Height 04/30/21 1115 5' 7"  (1.702 m)     Head Circumference --      Peak Flow --      Pain Score 04/30/21 1115 0     Pain Loc --      Pain Edu? --      Excl. in [place]? --     Constitutional: Alert and oriented. Well appearing and in no acute distress. Eyes: Conjunctivae are normal.  Head: Atraumatic. Nose: No congestion/rhinnorhea. Mouth/Throat: Mucous membranes  are moist.  Oropharynx non-erythematous. Neck: No stridor.   Cardiovascular: Normal rate, regular rhythm. Grossly normal heart sounds.  Good peripheral circulation. Respiratory: Normal respiratory effort.  No retractions. Lungs occasional scattered crackles  but mostly clear Gastrointestinal: Soft and nontender. No distention. No abdominal bruits.  Musculoskeletal: No lower extremity tenderness slight trace edema bilaterally.  N Neurologic:  Normal speech and language. No gross focal neurologic deficits are appreciated.   Skin:  Skin is warm, dry and intact. No rash noted.   ____________________________________________   LABS (all labs ordered are listed, but only abnormal results are displayed)  Labs Reviewed  RESP PANEL BY RT-PCR (FLU A&B, COVID) ARPGX2 - Abnormal; Notable for the following components:      Result Value   SARS Coronavirus 2 by RT PCR POSITIVE (*)    All other components within normal limits  COMPREHENSIVE METABOLIC PANEL - Abnormal; Notable for the following components:   Calcium 8.6 (*)    Total Bilirubin 1.3 (*)    All other components within normal limits  CBC WITH DIFFERENTIAL/PLATELET - Abnormal; Notable for the following components:   RBC 3.61 (*)    Hemoglobin 11.7 (*)    HCT 34.0 (*)    All  other components within normal limits  BRAIN NATRIURETIC PEPTIDE - Abnormal; Notable for the following components:   B Natriuretic Peptide 363.8 (*)    All other components within normal limits  TROPONIN I (HIGH SENSITIVITY) - Abnormal; Notable for the following components:   Troponin I (High Sensitivity) 18 (*)    All other components within normal limits  LACTIC ACID, PLASMA  PROCALCITONIN  LACTIC ACID, PLASMA  TROPONIN I (HIGH SENSITIVITY)   ____________________________________________  EKG  EKG read interpreted by me shows sinus bradycardia rate of 59 normal axis irregular baseline but no obvious ST-T wave changes are seen.  R wave progression is somewhat decreased ____________________________________________  RADIOLOGY Gertha Calkin, personally viewed and evaluated these images (plain radiographs) as part of my medical decision making, as well as reviewing the written report by the radiologist.  ED MD interpretation:   Chest x-ray with some bibasilar haziness.  This could be atelectasis or infiltrate or CHF. Official radiology report(s): DG Chest 2 View  Result Date: 04/30/2021 CLINICAL DATA:  Shortness of breath, nasal congestion EXAM: CHEST - 2 VIEW COMPARISON:  11/12/2020 FINDINGS: Bibasilar airspace disease which may reflect atelectasis versus pneumonia. Mild bilateral interstitial thickening. No pleural effusion or pneumothorax. Stable cardiomegaly. No acute osseous abnormality. IMPRESSION: Bilateral diffuse interstitial thickening with bibasilar airspace disease and cardiomegaly. Differential considerations include pulmonary vascular congestion and bibasilar atelectasis. Electronically Signed   By: Kathreen Devoid M.D.   On: 04/30/2021 12:25    ____________________________________________   PROCEDURES  Procedure(s) performed (including Critical Care):  Procedures   ____________________________________________   INITIAL IMPRESSION / ASSESSMENT AND PLAN / ED      Patient sats are 97-98.  With walking they go up higher.  She does not get short of breath.  She normally walks with a walker and does well with nurse or me supporting her when she is walk.  I offered her admission but she says it would be much more convenient to be at home.  Her daughter was present.  They will return if she gets any worse.  I will give her some Paxlovid         ____________________________________________   FINAL CLINICAL IMPRESSION(S) / ED DIAGNOSES  Final diagnoses:  Acute cough  COVID  ED Discharge Orders          Ordered    nirmatrelvir/ritonavir EUA (PAXLOVID) 20 x 150 MG & 10 x 100MG TABS  2 times daily        04/30/21 1520    benzonatate (TESSALON PERLES) 100 MG capsule  3 times daily PRN        04/30/21 1520             Note:  This document was prepared using Dragon voice recognition software and may include unintentional dictation errors.    Nena Polio, MD 04/30/21 [id number]

## 2022-05-09 ENCOUNTER — Emergency Department
Admission: EM | Admit: 2022-05-09 | Discharge: 2022-05-09 | Disposition: A | Discharge status: Home / Self Care | Payer: Medicare Other | Attending: Emergency Medicine | Admitting: Emergency Medicine

## 2022-05-09 ENCOUNTER — Emergency Department: Payer: Medicare Other

## 2022-05-09 ENCOUNTER — Other Ambulatory Visit: Payer: Self-pay

## 2022-05-09 DIAGNOSIS — S8001XA Contusion of right knee, initial encounter: Secondary | ICD-10-CM | POA: Diagnosis not present

## 2022-05-09 DIAGNOSIS — S5011XA Contusion of right forearm, initial encounter: Secondary | ICD-10-CM | POA: Insufficient documentation

## 2022-05-09 DIAGNOSIS — S51811A Laceration without foreign body of right forearm, initial encounter: Secondary | ICD-10-CM

## 2022-05-09 DIAGNOSIS — W010XXA Fall on same level from slipping, tripping and stumbling without subsequent striking against object, initial encounter: Secondary | ICD-10-CM | POA: Insufficient documentation

## 2022-05-09 DIAGNOSIS — S59911A Unspecified injury of right forearm, initial encounter: Secondary | ICD-10-CM | POA: Diagnosis present

## 2022-05-09 DIAGNOSIS — S7001XA Contusion of right hip, initial encounter: Secondary | ICD-10-CM | POA: Insufficient documentation

## 2022-05-09 LAB — BASIC METABOLIC PANEL
Anion gap: 6 (ref 5–15)
BUN: 13 mg/dL (ref 8–23)
CO2: 23 mmol/L (ref 22–32)
Calcium: 8.9 mg/dL (ref 8.9–10.3)
Chloride: 111 mmol/L (ref 98–111)
Creatinine, Ser: 0.95 mg/dL (ref 0.44–1.00)
GFR, Estimated: 60 mL/min — ABNORMAL LOW (ref >60)
Glucose, Bld: 122 mg/dL — ABNORMAL HIGH (ref 70–99)
Potassium: 3.8 mmol/L (ref 3.5–5.1)
Sodium: 140 mmol/L (ref 135–145)

## 2022-05-09 LAB — CBC
HCT: 33.8 % — ABNORMAL LOW (ref 36.0–46.0)
Hemoglobin: 10.5 g/dL — ABNORMAL LOW (ref 12.0–15.0)
MCH: 28.7 pg (ref 26.0–34.0)
MCHC: 31.1 g/dL (ref 30.0–36.0)
MCV: 92.3 fL (ref 80.0–100.0)
Platelets: 164 10*3/uL (ref 150–400)
RBC: 3.66 MIL/uL — ABNORMAL LOW (ref 3.87–5.11)
RDW: 13.6 % (ref 11.5–15.5)
WBC: 3.5 10*3/uL — ABNORMAL LOW (ref 4.0–10.5)
nRBC: 0 % (ref 0.0–0.2)

## 2022-05-09 NOTE — ED Triage Notes (Addendum)
Pt comes via EMS from [place]. Pt had unwitnessed fall. Pt denies any head injury or loc. No thinners. Pt states right hip pain that is chronic but is hurting more now.  Pt states right knee. Pt has abrasion to bottom of right forearm. Bandage in place.   Ems reports pt also states dizziness and multiple falls recently.

## 2022-05-09 NOTE — Discharge Instructions (Signed)
Return to the ER for new, worsening, or persistent severe pain, swelling, recurrent falls, inability to walk or bear weight, or any other new or worsening symptoms that concern you.  Change the dressing on the skin tear regularly and apply bacitracin or Neosporin ointment.

## 2022-05-09 NOTE — ED Provider Notes (Signed)
[facility] Provider Note    Event Date/Time   First MD Initiated Contact with Patient 05/09/22 1503     (approximate)   History   Fall   HPI  Anita Bennett is a 81 y.o. female with a history of chronic unsteady gait who normally walks with a walker and who presents after a mechanical fall while she was stepping to try to get to her walker.  She states that she lost her balance and fell onto her right side.  She reports pain to the right forearm, right hip, and right knee as well as some low back pain initially that resolved.  She did not hit her head and did not lose consciousness.  She denies any dizziness or lightheadedness.  She currently has no head, neck, or back pain.  She also has a small skin tear to the right forearm.  I reviewed the past medical records.  The patient's most recent medical encounter was with her neurologist Dr. Manuella Ghazi on 04/13/2022 and issues addressed included moderate mixed dementia, a right occipital punctate ischemic lesion, ongoing imbalance, and recurrent falls.   Physical Exam   Triage Vital Signs: ED Triage Vitals  Enc Vitals Group     BP 05/09/22 1306 (!) 161/74     Pulse Rate 05/09/22 1306 78     Resp 05/09/22 1306 18     Temp 05/09/22 1306 98 F (36.7 C)     Temp src --      SpO2 05/09/22 1306 100 %     Weight --      Height --      Head Circumference --      Peak Flow --      Pain Score 05/09/22 1255 3     Pain Loc --      Pain Edu? --      Excl. in [place]? --     Most recent vital signs: Vitals:   05/09/22 1306  BP: (!) 161/74  Pulse: 78  Resp: 18  Temp: 98 F (36.7 C)  SpO2: 100%     General: Awake, no distress.  CV:  Good peripheral perfusion.  Resp:  Normal effort.  Abd:  No distention.  Other:  3 cm superficial skin tear to right mid forearm.  Mild swelling to this area.  Full range of motion at wrist and elbow.  No bony tenderness or deformity.  Full range of motion to right hip and knee with  mild soft tissue tenderness.  No midline cervical, thoracic, or lumbar spinal tenderness.  Motor and sensory intact in all extremities.   ED Results / Procedures / Treatments   Labs (all labs ordered are listed, but only abnormal results are displayed) Labs Reviewed  CBC - Abnormal; Notable for the following components:      Result Value   WBC 3.5 (*)    RBC 3.66 (*)    Hemoglobin 10.5 (*)    HCT 33.8 (*)    All other components within normal limits  BASIC METABOLIC PANEL - Abnormal; Notable for the following components:   Glucose, Bld 122 (*)    GFR, Estimated 60 (*)    All other components within normal limits     EKG  ED ECG REPORT I, Arta Silence, the attending physician, personally viewed and interpreted this ECG.  Date: 05/09/2022 EKG Time: 1308 Rate: 77 Rhythm: normal sinus rhythm QRS Axis: normal Intervals: normal ST/T Wave abnormalities: No acute abnormalities Narrative Interpretation:  no evidence of acute ischemia    RADIOLOGY  I independently viewed and interpreted the images for the following studies:  XR R forearm: No acute fracture XR R knee: No acute fracture XR R hip: No acute fracture  PROCEDURES:  Critical Care performed: No  Procedures   MEDICATIONS ORDERED IN ED: Medications - No data to display   IMPRESSION / MDM / [place] / ED COURSE  I reviewed the triage vital signs and the nursing notes.  81 year old female with PMH as noted above presents with right forearm, hip, knee pain after a mechanical fall.  She did not hit her head.  Differential diagnosis includes, but is not limited to, contusion, fracture.  There is no evidence of head injury.  I have a low suspicion for fracture given that she has good range of motion and no deformity to the affected areas.  There is a small skin tear which does not require any acute intervention other than a dressing which I replaced.  We will obtain x-rays of the relevant areas  and reassess.  Patient's presentation is most consistent with acute complicated illness / injury requiring diagnostic workup.  ----------------------------------------- 4:17 PM on 05/09/2022 -----------------------------------------  X-rays are negative.  Lab work-up is significant for a borderline GFR, as well as anemia and a borderline low WBC count which appears to be somewhat worsened from chronic.  The patient has no evidence of acute bleeding.  At this time, she feels well and is stable for discharge home.  I counseled her and her daughter on the results of the work-up and the plan of care.  Return precautions given, and they expressed understanding.    FINAL CLINICAL IMPRESSION(S) / ED DIAGNOSES   Final diagnoses:  Skin tear of right forearm without complication, initial encounter  Contusion of right forearm, initial encounter  Contusion of right hip, initial encounter  Contusion of right knee, initial encounter     Rx / DC Orders   ED Discharge Orders     None        Note:  This document was prepared using Dragon voice recognition software and may include unintentional dictation errors.    Arta Silence, MD 05/09/22 [id number]

## 2022-08-21 ENCOUNTER — Ambulatory Visit (INDEPENDENT_AMBULATORY_CARE_PROVIDER_SITE_OTHER): Payer: Medicare Other | Admitting: Dermatology

## 2022-08-21 ENCOUNTER — Encounter: Payer: Self-pay | Admitting: Dermatology

## 2022-08-21 VITALS — BP 162/76 | HR 80

## 2022-08-21 DIAGNOSIS — L821 Other seborrheic keratosis: Secondary | ICD-10-CM | POA: Diagnosis not present

## 2022-08-21 DIAGNOSIS — L570 Actinic keratosis: Secondary | ICD-10-CM | POA: Diagnosis not present

## 2022-08-21 DIAGNOSIS — C44311 Basal cell carcinoma of skin of nose: Secondary | ICD-10-CM

## 2022-08-21 DIAGNOSIS — C4491 Basal cell carcinoma of skin, unspecified: Secondary | ICD-10-CM

## 2022-08-21 DIAGNOSIS — D492 Neoplasm of unspecified behavior of bone, soft tissue, and skin: Secondary | ICD-10-CM

## 2022-08-21 HISTORY — DX: Basal cell carcinoma of skin, unspecified: C44.91

## 2022-08-21 NOTE — Progress Notes (Signed)
Follow-Up Visit   Subjective  Anita Bennett is a 82 y.o. female who presents for the following: Skin Problem (Check a red spot on the right side of her nose for several years now and will no go away.).  Daughter [name] with patient and contributes to history  The following portions of the chart were reviewed this encounter and updated as appropriate:   Tobacco  Allergies  Meds  Problems  Med Hx  Surg Hx  Fam Hx      Review of Systems:  No other skin or systemic complaints except as noted in HPI or Assessment and Plan.  Objective  Well appearing patient in no apparent distress; mood and affect are within normal limits.  A focused examination was performed including upper extremities, including the arms, hands, fingers, and fingernails and face, nose. Relevant physical exam findings are noted in the Assessment and Plan.  right nasal tip 0.9 cm pink papule        left medial cheek x 1, left nasal side wall x 1, left superior brow x 1  (3) (3) Erythematous thin papules/macules with thicker gritty scale.     Assessment & Plan  Neoplasm of skin right nasal tip  Skin / nail biopsy Type of biopsy: tangential   Informed consent: discussed and consent obtained   Patient was prepped and draped in usual sterile fashion: area prepped with alochol. Anesthesia: the lesion was anesthetized in a standard fashion   Anesthetic:  1% lidocaine w/ epinephrine 1-100,000 buffered w/ 8.4% NaHCO3 Instrument used: flexible razor blade   Hemostasis achieved with: pressure, aluminum chloride and electrodesiccation   Outcome: patient tolerated procedure well   Post-procedure details: wound care instructions given   Post-procedure details comment:  Ointment and small bandage  Specimen 1 - Surgical pathology Differential Diagnosis: R/O BCC   Check Margins: No  Discussed Mohs surgery or radiation options if biopsy results confirm skin cancer. Did also discuss option of ED&C and higher risk  of recurrence with that treatment option.  AK (actinic keratosis) (3) left medial cheek x 1, left nasal side wall x 1, left superior brow x 1  (3)  Actinic keratoses are precancerous spots that appear secondary to cumulative UV radiation exposure/sun exposure over time. They are chronic with expected duration over 1 year. A portion of actinic keratoses will progress to squamous cell carcinoma of the skin. It is not possible to reliably predict which spots will progress to skin cancer and so treatment is recommended to prevent development of skin cancer.  Recommend daily broad spectrum sunscreen SPF 30+ to sun-exposed areas, reapply every 2 hours as needed.  Recommend staying in the shade or wearing long sleeves, sun glasses (UVA+UVB protection) and wide brim hats (4-inch brim around the entire circumference of the hat). Call for new or changing lesions.   Destruction of lesion - left medial cheek x 1, left nasal side wall x 1, left superior brow x 1  (3) Complexity: simple   Destruction method: cryotherapy   Informed consent: discussed and consent obtained   Timeout:  patient name, date of birth, surgical site, and procedure verified Lesion destroyed using liquid nitrogen: Yes   Region frozen until ice ball extended beyond lesion: Yes   Outcome: patient tolerated procedure well with no complications   Post-procedure details: wound care instructions given   Additional details:  Prior to procedure, discussed risks of blister formation, small wound, skin dyspigmentation, or rare scar following cryotherapy. Recommend Vaseline ointment to  treated areas while healing.  Seborrheic Keratoses - Stuck-on, waxy, tan-brown papules and/or plaques  - Benign-appearing - Discussed benign etiology and prognosis. - Observe - Call for any changes  Return in about 3 months (around 11/20/2022) for TBSE or recheck nose .  I, Anita Bennett, CMA, am acting as scribe for Forest Gleason, MD .   Documentation: I  have reviewed the above documentation for accuracy and completeness, and I agree with the above.  Forest Gleason, MD

## 2022-08-21 NOTE — Patient Instructions (Addendum)
Cryotherapy Aftercare  Wash gently with soap and water everyday.   Apply Vaseline and Band-Aid daily until healed.  Wound Care Instructions  Cleanse wound gently with soap and water once a day then pat dry with clean gauze. Apply a thin coat of Petrolatum (petroleum jelly, "Vaseline") over the wound (unless you have an allergy to this). We recommend that you use a new, sterile tube of Vaseline. Do not pick or remove scabs. Do not remove the yellow or white "healing tissue" from the base of the wound.  Cover the wound with fresh, clean, nonstick gauze and secure with paper tape. You may use Band-Aids in place of gauze and tape if the wound is small enough, but would recommend trimming much of the tape off as there is often too much. Sometimes Band-Aids can irritate the skin.  You should call the office for your biopsy report after 1 week if you have not already been contacted.  If you experience any problems, such as abnormal amounts of bleeding, swelling, significant bruising, significant pain, or evidence of infection, please call the office immediately.  FOR ADULT SURGERY PATIENTS: If you need something for pain relief you may take 1 extra strength Tylenol (acetaminophen) AND 2 Ibuprofen (200mg each) together every 4 hours as needed for pain. (do not take these if you are allergic to them or if you have a reason you should not take them.) Typically, you may only need pain medication for 1 to 3 days.      Due to recent changes in healthcare laws, you may see results of your pathology and/or laboratory studies on MyChart before the doctors have had a chance to review them. We understand that in some cases there may be results that are confusing or concerning to you. Please understand that not all results are received at the same time and often the doctors may need to interpret multiple results in order to provide you with the best plan of care or course of treatment. Therefore, we ask that you  please give us 2 business days to thoroughly review all your results before contacting the office for clarification. Should we see a critical lab result, you will be contacted sooner.   If You Need Anything After Your Visit  If you have any questions or concerns for your doctor, please call our main line at [phone] and press option 4 to reach your doctor's medical assistant. If no one answers, please leave a voicemail as directed and we will return your call as soon as possible. Messages left after 4 pm will be answered the following business day.   You may also send us a message via MyChart. We typically respond to MyChart messages within 1-2 business days.  For prescription refills, please ask your pharmacy to contact our office. Our fax number is [phone].  If you have an urgent issue when the clinic is closed that cannot wait until the next business day, you can page your doctor at the number below.    Please note that while we do our best to be available for urgent issues outside of office hours, we are not available 24/7.   If you have an urgent issue and are unable to reach us, you may choose to seek medical care at your doctor's office, retail clinic, urgent care center, or emergency room.  If you have a medical emergency, please immediately call 911 or go to the emergency department.  Pager Numbers  - Dr. Kowalski: [phone]  -   Dr. Moye: [phone]  - Dr. Stewart: [phone]  In the event of inclement weather, please call our main line at [phone] for an update on the status of any delays or closures.  Dermatology Medication Tips: Please keep the boxes that topical medications come in in order to help keep track of the instructions about where and how to use these. Pharmacies typically print the medication instructions only on the boxes and not directly on the medication tubes.   If your medication is too expensive, please contact our office at  [phone] option 4 or send us a message through MyChart.   We are unable to tell what your co-pay for medications will be in advance as this is different depending on your insurance coverage. However, we may be able to find a substitute medication at lower cost or fill out paperwork to get insurance to cover a needed medication.   If a prior authorization is required to get your medication covered by your insurance company, please allow us 1-2 business days to complete this process.  Drug prices often vary depending on where the prescription is filled and some pharmacies may offer cheaper prices.  The website www.goodrx.com contains coupons for medications through different pharmacies. The prices here do not account for what the cost may be with help from insurance (it may be cheaper with your insurance), but the website can give you the price if you did not use any insurance.  - You can print the associated coupon and take it with your prescription to the pharmacy.  - You may also stop by our office during regular business hours and pick up a GoodRx coupon card.  - If you need your prescription sent electronically to a different pharmacy, notify our office through [facility] MyChart or by phone at [phone] option 4.     Si Usted Necesita Algo Despus de Su Visita  Tambin puede enviarnos un mensaje a travs de MyChart. Por lo general respondemos a los mensajes de MyChart en el transcurso de 1 a 2 das hbiles.  Para renovar recetas, por favor pida a su farmacia que se ponga en contacto con nuestra oficina. Nuestro nmero de fax es el [phone].  Si tiene un asunto urgente cuando la clnica est cerrada y que no puede esperar hasta el siguiente da hbil, puede llamar/localizar a su doctor(a) al nmero que aparece a continuacin.   Por favor, tenga en cuenta que aunque hacemos todo lo posible para estar disponibles para asuntos urgentes fuera del horario de oficina, no estamos  disponibles las 24 horas del da, los 7 das de la semana.   Si tiene un problema urgente y no puede comunicarse con nosotros, puede optar por buscar atencin mdica  en el consultorio de su doctor(a), en una clnica privada, en un centro de atencin urgente o en una sala de emergencias.  Si tiene una emergencia mdica, por favor llame inmediatamente al 911 o vaya a la sala de emergencias.  Nmeros de bper  - Dr. Kowalski: [phone]  - Dra. Moye: [phone]  - Dra. Stewart: [phone]  En caso de inclemencias del tiempo, por favor llame a nuestra lnea principal al [phone] para una actualizacin sobre el estado de cualquier retraso o cierre.  Consejos para la medicacin en dermatologa: Por favor, guarde las cajas en las que vienen los medicamentos de uso tpico para ayudarle a seguir las instrucciones sobre dnde y cmo usarlos. Las farmacias generalmente imprimen las instrucciones del medicamento slo en las cajas y   no directamente en los tubos del medicamento.   Si su medicamento es muy caro, por favor, pngase en contacto con nuestra oficina llamando al [phone] y presione la opcin 4 o envenos un mensaje a travs de MyChart.   No podemos decirle cul ser su copago por los medicamentos por adelantado ya que esto es diferente dependiendo de la cobertura de su seguro. Sin embargo, es posible que podamos encontrar un medicamento sustituto a menor costo o llenar un formulario para que el seguro cubra el medicamento que se considera necesario.   Si se requiere una autorizacin previa para que su compaa de seguros cubra su medicamento, por favor permtanos de 1 a 2 das hbiles para completar este proceso.  Los precios de los medicamentos varan con frecuencia dependiendo del lugar de dnde se surte la receta y alguna farmacias pueden ofrecer precios ms baratos.  El sitio web www.goodrx.com tiene cupones para medicamentos de diferentes farmacias. Los precios aqu no  tienen en cuenta lo que podra costar con la ayuda del seguro (puede ser ms barato con su seguro), pero el sitio web puede darle el precio si no utiliz ningn seguro.  - Puede imprimir el cupn correspondiente y llevarlo con su receta a la farmacia.  - Tambin puede pasar por nuestra oficina durante el horario de atencin regular y recoger una tarjeta de cupones de GoodRx.  - Si necesita que su receta se enve electrnicamente a una farmacia diferente, informe a nuestra oficina a travs de MyChart de  o por telfono llamando al [phone] y presione la opcin 4.  

## 2022-09-13 ENCOUNTER — Telehealth: Payer: Self-pay

## 2022-09-13 NOTE — Telephone Encounter (Signed)
-----   Message from Alfonso Patten, MD sent at 09/04/2022  3:17 PM EST ----- Skin , right nasal tip BASAL CELL CARCINOMA, NODULAR PATTERN, DEEP MARGIN INVOLVED --> Mohs surgery is highest cure rate. However, if she is not up for surgery could do radiation (many visits to radiation oncology but cure rate around 92%) or ED&C scrape and burn procedure which is a quick in office procedure leaving a wound to heal in with good wound care but cure rate is lower, possibly under 80% in this location.  We did discuss these options at the time of her visit as well.  No answer, left voicemail.    MAs please call with results and schedule or refer. Please let me know if they have any questions or wish to discuss with me. Thank you!

## 2022-09-26 ENCOUNTER — Telehealth: Payer: Self-pay

## 2022-09-26 NOTE — Telephone Encounter (Signed)
-----   Message from Virginia Moye, MD sent at 09/04/2022  3:17 PM EST ----- Skin , right nasal tip BASAL CELL CARCINOMA, NODULAR PATTERN, DEEP MARGIN INVOLVED --> Mohs surgery is highest cure rate. However, if she is not up for surgery could do radiation (many visits to radiation oncology but cure rate around 92%) or ED&C scrape and burn procedure which is a quick in office procedure leaving a wound to heal in with good wound care but cure rate is lower, possibly under 80% in this location.  We did discuss these options at the time of her visit as well.  No answer, left voicemail.    MAs please call with results and schedule or refer. Please let me know if they have any questions or wish to discuss with me. Thank you!   

## 2022-09-27 ENCOUNTER — Telehealth: Payer: Self-pay

## 2022-09-27 NOTE — Telephone Encounter (Addendum)
Tried calling patient . No answer. LMOM for patient's daughter to return call.   ----- Message from Alfonso Patten, MD sent at 09/04/2022  3:17 PM EST ----- Skin , right nasal tip BASAL CELL CARCINOMA, NODULAR PATTERN, DEEP MARGIN INVOLVED --> Mohs surgery is highest cure rate. However, if she is not up for surgery could do radiation (many visits to radiation oncology but cure rate around 92%) or ED&C scrape and burn procedure which is a quick in office procedure leaving a wound to heal in with good wound care but cure rate is lower, possibly under 80% in this location.  We did discuss these options at the time of her visit as well.  No answer, left voicemail.    MAs please call with results and schedule or refer. Please let me know if they have any questions or wish to discuss with me. Thank you!

## 2022-10-03 ENCOUNTER — Telehealth: Payer: Self-pay

## 2022-10-03 NOTE — Telephone Encounter (Signed)
Spoke with patient's daughter, Mickel Baas, last week and advised her of BX results.  Called and left her a message today to follow up on treatment options for patient. aw

## 2022-10-05 ENCOUNTER — Other Ambulatory Visit
Admission: RE | Admit: 2022-10-05 | Discharge: 2022-10-05 | Disposition: A | Discharge status: Home / Self Care | Payer: Medicare Other | Source: Ambulatory Visit | Attending: Family Medicine | Admitting: Family Medicine

## 2022-10-05 DIAGNOSIS — K7581 Nonalcoholic steatohepatitis (NASH): Secondary | ICD-10-CM | POA: Insufficient documentation

## 2022-10-05 DIAGNOSIS — K7460 Unspecified cirrhosis of liver: Secondary | ICD-10-CM | POA: Insufficient documentation

## 2022-11-06 ENCOUNTER — Telehealth: Payer: Self-pay

## 2022-11-06 NOTE — Telephone Encounter (Signed)
LVM for patient's daughter to please call the office so we can reschedule appt 4/25. Patient still has BCC at nasal tip that has not been treated, JS

## 2022-11-15 ENCOUNTER — Ambulatory Visit: Payer: Medicare Other | Admitting: Dermatology

## 2022-12-05 ENCOUNTER — Ambulatory Visit: Payer: Medicare Other | Admitting: Dermatology

## 2022-12-05 ENCOUNTER — Encounter: Payer: Self-pay | Admitting: Dermatology

## 2022-12-05 DIAGNOSIS — L814 Other melanin hyperpigmentation: Secondary | ICD-10-CM | POA: Diagnosis not present

## 2022-12-05 DIAGNOSIS — L570 Actinic keratosis: Secondary | ICD-10-CM | POA: Diagnosis not present

## 2022-12-05 DIAGNOSIS — L905 Scar conditions and fibrosis of skin: Secondary | ICD-10-CM | POA: Diagnosis not present

## 2022-12-05 DIAGNOSIS — D239 Other benign neoplasm of skin, unspecified: Secondary | ICD-10-CM

## 2022-12-05 DIAGNOSIS — D225 Melanocytic nevi of trunk: Secondary | ICD-10-CM | POA: Diagnosis not present

## 2022-12-05 DIAGNOSIS — Z1283 Encounter for screening for malignant neoplasm of skin: Secondary | ICD-10-CM

## 2022-12-05 DIAGNOSIS — X32XXXA Exposure to sunlight, initial encounter: Secondary | ICD-10-CM

## 2022-12-05 DIAGNOSIS — D1801 Hemangioma of skin and subcutaneous tissue: Secondary | ICD-10-CM | POA: Diagnosis not present

## 2022-12-05 DIAGNOSIS — W908XXA Exposure to other nonionizing radiation, initial encounter: Secondary | ICD-10-CM | POA: Diagnosis not present

## 2022-12-05 DIAGNOSIS — C44311 Basal cell carcinoma of skin of nose: Secondary | ICD-10-CM

## 2022-12-05 DIAGNOSIS — D492 Neoplasm of unspecified behavior of bone, soft tissue, and skin: Secondary | ICD-10-CM

## 2022-12-05 DIAGNOSIS — L821 Other seborrheic keratosis: Secondary | ICD-10-CM

## 2022-12-05 DIAGNOSIS — B353 Tinea pedis: Secondary | ICD-10-CM

## 2022-12-05 DIAGNOSIS — L578 Other skin changes due to chronic exposure to nonionizing radiation: Secondary | ICD-10-CM

## 2022-12-05 HISTORY — DX: Other benign neoplasm of skin, unspecified: D23.9

## 2022-12-05 NOTE — Progress Notes (Signed)
Follow-Up Visit   Subjective  Anita Bennett is a 82 y.o. female who presents for the following: Skin Cancer Screening and Full Body Skin Exam  The patient presents for Total-Body Skin Exam (TBSE) for skin cancer screening and mole check. The patient has spots, moles and lesions to be evaluated, some may be new or changing and the patient has concerns that these could be cancer.  Hx BCC  The following portions of the chart were reviewed this encounter and updated as appropriate: medications, allergies, medical history  Review of Systems:  No other skin or systemic complaints except as noted in HPI or Assessment and Plan.  Objective  Well appearing patient in no apparent distress; mood and affect are within normal limits.  A full examination was performed including scalp, head, eyes, ears, nose, lips, neck, chest, axillae, abdomen, back, bilateral upper extremities, bilateral lower extremities from knees down, hands, feet, fingers, toes, fingernails, and toenails. All findings within normal limits unless otherwise noted below.   Relevant physical exam findings are noted in the Assessment and Plan.  right nasal tip Pink bx site  Upper Mid Back 0.5 cm irregular medium to dark brown thin papule  R cheek x 1, L nose x 2, L temple x 1, L cheek x 3 (7) Erythematous thin papules/macules with gritty scale.     Assessment & Plan   LENTIGINES, SEBORRHEIC KERATOSES, HEMANGIOMAS - Benign normal skin lesions - Benign-appearing - Call for any changes  MELANOCYTIC NEVI - Tan-brown and/or pink-flesh-colored symmetric macules and papules - Benign appearing on exam today - Observation - Call clinic for new or changing moles - Recommend daily use of broad spectrum spf 30+ sunscreen to sun-exposed areas.   ACTINIC DAMAGE - Chronic condition, secondary to cumulative UV/sun exposure - diffuse scaly erythematous macules with underlying dyspigmentation - Recommend daily broad spectrum  sunscreen SPF 30+ to sun-exposed areas, reapply every 2 hours as needed.  - Staying in the shade or wearing long sleeves, sun glasses (UVA+UVB protection) and wide brim hats (4-inch brim around the entire circumference of the hat) are also recommended for sun protection.  - Call for new or changing lesions.  SKIN CANCER SCREENING PERFORMED TODAY.  TINEA PEDIS Exam: Scaling and maceration web spaces and over distal and lateral soles.  Treatment Plan: Not bothersome for patient.    Basal cell carcinoma (BCC) of skin of nose right nasal tip  Bx proven  Recommend Mohs > radiation vs EDC  Will refer [facility] [place]  Related Procedures Ambulatory referral to Dermatology  Neoplasm of skin Upper Mid Back  Epidermal / dermal shaving  Lesion diameter (cm):  0.5 Informed consent: discussed and consent obtained   Timeout: patient name, date of birth, surgical site, and procedure verified   Anesthesia: the lesion was anesthetized in a standard fashion   Anesthetic:  1% lidocaine w/ epinephrine 1-100,000 local infiltration Instrument used: flexible razor blade   Hemostasis achieved with: aluminum chloride   Outcome: patient tolerated procedure well   Post-procedure details: wound care instructions given   Additional details:  Mupirocin and a bandage applied  Specimen 1 - Surgical pathology Differential Diagnosis: r/o Atypia  Check Margins: No 0.5 cm irregular medium to dark brown thin papule  AK (actinic keratosis) (7) R cheek x 1, L nose x 2, L temple x 1, L cheek x 3  Actinic keratoses are precancerous spots that appear secondary to cumulative UV radiation exposure/sun exposure over time.  They are chronic with expected duration over 1 year. A portion of actinic keratoses will progress to squamous cell carcinoma of the skin. It is not possible to reliably predict which spots will progress to skin cancer and so treatment is recommended to prevent  development of skin cancer.  Recommend daily broad spectrum sunscreen SPF 30+ to sun-exposed areas, reapply every 2 hours as needed.  Recommend staying in the shade or wearing long sleeves, sun glasses (UVA+UVB protection) and wide brim hats (4-inch brim around the entire circumference of the hat). Call for new or changing lesions.  Prior to procedure, discussed risks of blister formation, small wound, skin dyspigmentation, or rare scar following cryotherapy. Recommend Vaseline ointment to treated areas while healing.   Destruction of lesion - R cheek x 1, L nose x 2, L temple x 1, L cheek x 3  Destruction method: cryotherapy   Informed consent: discussed and consent obtained   Lesion destroyed using liquid nitrogen: Yes   Cryotherapy cycles:  2 Outcome: patient tolerated procedure well with no complications   Post-procedure details: wound care instructions given     Return in about 6 months (around 06/07/2023) for TBSE, Hx BCC, Hx AK.  Anise Salvo, RMA, am acting as scribe for Darden Dates, MD .   Documentation: I have reviewed the above documentation for accuracy and completeness, and I agree with the above.  Darden Dates, MD

## 2022-12-05 NOTE — Progress Notes (Signed)
Referral sent 12/05/22 to [facility] for Mohs, JS

## 2022-12-05 NOTE — Patient Instructions (Addendum)
Wound Care Instructions  Cleanse wound gently with soap and water once a day then pat dry with clean gauze. Apply a thin coat of Petrolatum (petroleum jelly, "Vaseline") over the wound (unless you have an allergy to this). We recommend that you use a new, sterile tube of Vaseline. Do not pick or remove scabs. Do not remove the yellow or white "healing tissue" from the base of the wound.  Cover the wound with fresh, clean, nonstick gauze and secure with paper tape. You may use Band-Aids in place of gauze and tape if the wound is small enough, but would recommend trimming much of the tape off as there is often too much. Sometimes Band-Aids can irritate the skin.  You should call the office for your biopsy report after 1 week if you have not already been contacted.  If you experience any problems, such as abnormal amounts of bleeding, swelling, significant bruising, significant pain, or evidence of infection, please call the office immediately.  FOR ADULT SURGERY PATIENTS: If you need something for pain relief you may take 1 extra strength Tylenol (acetaminophen) AND 2 Ibuprofen (200mg each) together every 4 hours as needed for pain. (do not take these if you are allergic to them or if you have a reason you should not take them.) Typically, you may only need pain medication for 1 to 3 days.   Due to recent changes in healthcare laws, you may see results of your pathology and/or laboratory studies on MyChart before the doctors have had a chance to review them. We understand that in some cases there may be results that are confusing or concerning to you. Please understand that not all results are received at the same time and often the doctors may need to interpret multiple results in order to provide you with the best plan of care or course of treatment. Therefore, we ask that you please give us 2 business days to thoroughly review all your results before contacting the office for clarification. Should we  see a critical lab result, you will be contacted sooner.   If You Need Anything After Your Visit  If you have any questions or concerns for your doctor, please call our main line at [phone] and press option 4 to reach your doctor's medical assistant. If no one answers, please leave a voicemail as directed and we will return your call as soon as possible. Messages left after 4 pm will be answered the following business day.   You may also send us a message via MyChart. We typically respond to MyChart messages within 1-2 business days.  For prescription refills, please ask your pharmacy to contact our office. Our fax number is [phone].  If you have an urgent issue when the clinic is closed that cannot wait until the next business day, you can page your doctor at the number below.    Please note that while we do our best to be available for urgent issues outside of office hours, we are not available 24/7.   If you have an urgent issue and are unable to reach us, you may choose to seek medical care at your doctor's office, retail clinic, urgent care center, or emergency room.  If you have a medical emergency, please immediately call 911 or go to the emergency department.  Pager Numbers  - Dr. Kowalski: [phone]  - Dr. Moye: [phone]  - Dr. Stewart: [phone]  In the event of inclement weather, please call our main line at [phone] for   an update on the status of any delays or closures.  Dermatology Medication Tips: Please keep the boxes that topical medications come in in order to help keep track of the instructions about where and how to use these. Pharmacies typically print the medication instructions only on the boxes and not directly on the medication tubes.   If your medication is too expensive, please contact our office at [phone] option 4 or send us a message through MyChart.   We are unable to tell what your co-pay for medications will be in advance  as this is different depending on your insurance coverage. However, we may be able to find a substitute medication at lower cost or fill out paperwork to get insurance to cover a needed medication.   If a prior authorization is required to get your medication covered by your insurance company, please allow us 1-2 business days to complete this process.  Drug prices often vary depending on where the prescription is filled and some pharmacies may offer cheaper prices.  The website www.goodrx.com contains coupons for medications through different pharmacies. The prices here do not account for what the cost may be with help from insurance (it may be cheaper with your insurance), but the website can give you the price if you did not use any insurance.  - You can print the associated coupon and take it with your prescription to the pharmacy.  - You may also stop by our office during regular business hours and pick up a GoodRx coupon card.  - If you need your prescription sent electronically to a different pharmacy, notify our office through  MyChart or by phone at [phone] option 4.    

## 2022-12-20 ENCOUNTER — Telehealth: Payer: Self-pay

## 2022-12-20 NOTE — Telephone Encounter (Signed)
Left message on daughter's voicemail to return my call. 

## 2022-12-20 NOTE — Telephone Encounter (Signed)
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 If she is not up for excision, would recommend broader shave removal. Excision has the highest confidence of removing this and preventing progression to melanoma skin cancer.  We also need her to come back to clinica TODAY if possible, first thing next week if not, to confirm and take a picture of the biopsy site. There is no photo.   MAs please call with results and schedule. Thank you!

## 2022-12-24 ENCOUNTER — Telehealth: Payer: Self-pay | Admitting: Dermatology

## 2022-12-24 ENCOUNTER — Telehealth: Payer: Self-pay

## 2022-12-24 NOTE — Telephone Encounter (Signed)
Please call pt back re biopsy results. We need to get a photo of the biopsy site on her back before it is 100% healed. We need to get her scheduled for excision > repeat shave removal for this atypical mole.  Thank you!

## 2022-12-24 NOTE — Telephone Encounter (Signed)
-----   Message from Anita Moye, MD sent at 12/13/2022  6:07 AM EDT ----- Skin , upper mid back DYSPLASTIC JUNCTIONAL LENTIGINOUS NEVUS WITH MODERATE TO SEVERE ATYPIA WITH FOCAL SCAR, MARGIN CLOSE, SEE DESCRIPTION --> excision recommended. If she is not up for excision, would recommend broader shave removal. Excision has the highest confidence of removing this and preventing progression to melanoma skin cancer.  We also need her to come back to clinica TODAY if possible, first thing next week if not, to confirm and take a picture of the biopsy site. There is no photo.   MAs please call with results and schedule. Thank you!   

## 2022-12-27 ENCOUNTER — Telehealth: Payer: Self-pay

## 2022-12-27 ENCOUNTER — Telehealth: Payer: Self-pay | Admitting: Dermatology

## 2022-12-27 ENCOUNTER — Encounter: Payer: Self-pay | Admitting: Dermatology

## 2022-12-27 NOTE — Telephone Encounter (Signed)
Called patient back regarding results and request to stop by ASAP for a photo. Called all phone numbers listed. Left voicemail on all 3 phone numbers.  Sent a [employer].   Skin , upper mid back DYSPLASTIC JUNCTIONAL LENTIGINOUS NEVUS WITH MODERATE TO SEVERE ATYPIA WITH FOCAL SCAR, MARGIN CLOSE, SEE DESCRIPTION --> excision recommended. If she is not up for excision, would recommend broader shave removal. Excision has the highest confidence of removing this and preventing progression to melanoma skin cancer.

## 2022-12-27 NOTE — Telephone Encounter (Signed)
Patient daughter called LM on VM she was calling to discuss Dr Elvera Lennox message that she received by my chart today from Dr Neale Burly  LM on VM returning her call please call back

## 2022-12-31 ENCOUNTER — Telehealth: Payer: Self-pay

## 2022-12-31 NOTE — Telephone Encounter (Addendum)
Called regarding bx results. No answer. LMOM for patient to return call.   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 If she is not up for excision, would recommend broader shave removal. Excision has the highest confidence of removing this and preventing progression to melanoma skin cancer.  We also need her to come back to [facility] TODAY if possible, first thing next week if not, to confirm and take a picture of the biopsy site. There is no photo.   MAs please call with results and schedule. Thank you!

## 2023-01-04 ENCOUNTER — Telehealth: Payer: Self-pay | Admitting: Dermatology

## 2023-01-04 NOTE — Telephone Encounter (Signed)
Please send a certified letter. Thank you!     Dear Ms. Anita Bennett,  I hope this letter finds you well.   It has been my sincere pleasure to be a part of your healthcare team. At the time of my leaving, our records show you have a skin cancer and an atypical mole which have not been treated yet including:  - basal cell skin cancer of the right nasal tip - Mohs surgery recommended - atypical mole with moderate to severe atypia at the upper mid back - excision or broader shave removal recommended  It is important that you have the atypical mole treated since some of these atypical moles progress to melanoma, one of the most dangerous types of skin cancer. Please call our office at [phone] to schedule an appointment to get this treated. Please also stop by our office during business hours (8 am to 5 pm Monday - Thursday) at your earliest availability for a walk-in nurse appointment to take a photograph of your back where the biopsy was taken.   Since I will be unable to follow-up with you in future, I am also writing to stress the importance of keeping your Mohs surgery appointment for the basal cell skin cancer. When treated, skin cancer typically has very good outcomes. However, if skin cancer is left untreated it can pose serious health risks including destroying nearby tissue or even death.    If for any reason you are unable to go to your Mohs surgery appointment or if you do not have your appointment scheduled, please reach out to the [facility] at [phone].   You can also reach out to our office at [phone] option 4 or through MyChart if you need assistance.   If you have any concerns about your treatment and wish to discuss other treatment options, please reach out to our office as well. You can reach [place] by phone at [phone] option 4 or through [employer].    As I am transitioning from the practice, I regret that I will not be able to continue with your care  personally. However, I am confident you are in capable hands with my colleagues and the team at [facility].    Thank you for entrusting me with your skin health. 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 Please do not hesitate to reach out if you have any questions or concerns.   Wishing you continued health and well-being.   Sincerely,   Sandi Mealy, MD, MPH Dermatologist [facility] [phone] option 4

## 2023-01-07 ENCOUNTER — Encounter: Payer: Self-pay | Admitting: Dermatology

## 2023-01-09 ENCOUNTER — Encounter: Payer: Self-pay | Admitting: Dermatology

## 2023-01-16 ENCOUNTER — Emergency Department: Payer: Medicare Other

## 2023-01-16 ENCOUNTER — Other Ambulatory Visit: Payer: Self-pay

## 2023-01-16 ENCOUNTER — Emergency Department
Admission: EM | Admit: 2023-01-16 | Discharge: 2023-01-16 | Discharge status: Against Medical Advice | Payer: Medicare Other | Attending: Emergency Medicine | Admitting: Emergency Medicine

## 2023-01-16 DIAGNOSIS — Z5321 Procedure and treatment not carried out due to patient leaving prior to being seen by health care provider: Secondary | ICD-10-CM | POA: Insufficient documentation

## 2023-01-16 DIAGNOSIS — Y9301 Activity, walking, marching and hiking: Secondary | ICD-10-CM | POA: Diagnosis not present

## 2023-01-16 DIAGNOSIS — M25511 Pain in right shoulder: Secondary | ICD-10-CM | POA: Insufficient documentation

## 2023-01-16 DIAGNOSIS — W0110XA Fall on same level from slipping, tripping and stumbling with subsequent striking against unspecified object, initial encounter: Secondary | ICD-10-CM | POA: Insufficient documentation

## 2023-01-16 DIAGNOSIS — I10 Essential (primary) hypertension: Secondary | ICD-10-CM | POA: Insufficient documentation

## 2023-01-16 DIAGNOSIS — S0990XA Unspecified injury of head, initial encounter: Secondary | ICD-10-CM | POA: Diagnosis not present

## 2023-01-16 DIAGNOSIS — S0011XA Contusion of right eyelid and periocular area, initial encounter: Secondary | ICD-10-CM | POA: Insufficient documentation

## 2023-01-16 NOTE — ED Triage Notes (Signed)
Pt to ED via POV c/o mechanical fall. Pt was carrying laundry basket on walker and was walking when it tipped over, taking her with it. Pt hit head on the floor, no LOC. Denies any bloodthinners. Pt has hematoma to right eye and having some right shoulder pain. Pt has chronic right shoulder pain. Pt denies CP, SOB, dizziness. Pt has hx of HTN, hasn't taken nighttime BP meds

## 2023-04-24 ENCOUNTER — Other Ambulatory Visit: Payer: Self-pay | Admitting: Neurology

## 2023-04-24 DIAGNOSIS — H532 Diplopia: Secondary | ICD-10-CM

## 2023-04-30 ENCOUNTER — Ambulatory Visit
Admission: RE | Admit: 2023-04-30 | Discharge: 2023-04-30 | Disposition: A | Discharge status: Home / Self Care | Payer: Medicare Other | Source: Ambulatory Visit | Attending: Neurology | Admitting: Neurology

## 2023-04-30 DIAGNOSIS — H532 Diplopia: Secondary | ICD-10-CM

## 2023-04-30 MED ORDER — GADOPICLENOL 0.5 MMOL/ML IV SOLN: 10.0000 mL | Freq: Once | INTRAVENOUS | Status: DC | PRN

## 2023-06-04 ENCOUNTER — Encounter: Payer: Medicare Other | Admitting: Dermatology

## 2023-08-08 ENCOUNTER — Other Ambulatory Visit: Payer: Self-pay

## 2023-08-08 ENCOUNTER — Inpatient Hospital Stay
Admission: EM | Admit: 2023-08-08 | Discharge: 2023-08-15 | DRG: 871 | Disposition: A | Discharge status: Home / Self Care | Payer: Medicare Other | Source: Skilled Nursing Facility | Attending: Internal Medicine | Admitting: Internal Medicine

## 2023-08-08 ENCOUNTER — Emergency Department: Payer: Medicare Other

## 2023-08-08 ENCOUNTER — Encounter: Payer: Self-pay | Admitting: Intensive Care

## 2023-08-08 DIAGNOSIS — E669 Obesity, unspecified: Secondary | ICD-10-CM | POA: Diagnosis present

## 2023-08-08 DIAGNOSIS — E876 Hypokalemia: Secondary | ICD-10-CM | POA: Diagnosis present

## 2023-08-08 DIAGNOSIS — K7460 Unspecified cirrhosis of liver: Secondary | ICD-10-CM | POA: Diagnosis present

## 2023-08-08 DIAGNOSIS — R6520 Severe sepsis without septic shock: Secondary | ICD-10-CM | POA: Diagnosis present

## 2023-08-08 DIAGNOSIS — Z79890 Hormone replacement therapy (postmenopausal): Secondary | ICD-10-CM

## 2023-08-08 DIAGNOSIS — F32A Depression, unspecified: Secondary | ICD-10-CM | POA: Diagnosis present

## 2023-08-08 DIAGNOSIS — Z85828 Personal history of other malignant neoplasm of skin: Secondary | ICD-10-CM

## 2023-08-08 DIAGNOSIS — K859 Acute pancreatitis without necrosis or infection, unspecified: Secondary | ICD-10-CM | POA: Diagnosis present

## 2023-08-08 DIAGNOSIS — A4151 Sepsis due to Escherichia coli [E. coli]: Principal | ICD-10-CM | POA: Diagnosis present

## 2023-08-08 DIAGNOSIS — N3000 Acute cystitis without hematuria: Secondary | ICD-10-CM | POA: Diagnosis not present

## 2023-08-08 DIAGNOSIS — I5032 Chronic diastolic (congestive) heart failure: Secondary | ICD-10-CM | POA: Diagnosis not present

## 2023-08-08 DIAGNOSIS — A419 Sepsis, unspecified organism: Principal | ICD-10-CM

## 2023-08-08 DIAGNOSIS — N390 Urinary tract infection, site not specified: Secondary | ICD-10-CM | POA: Diagnosis present

## 2023-08-08 DIAGNOSIS — E872 Acidosis, unspecified: Secondary | ICD-10-CM | POA: Diagnosis present

## 2023-08-08 DIAGNOSIS — Z888 Allergy status to other drugs, medicaments and biological substances status: Secondary | ICD-10-CM

## 2023-08-08 DIAGNOSIS — I083 Combined rheumatic disorders of mitral, aortic and tricuspid valves: Secondary | ICD-10-CM | POA: Diagnosis present

## 2023-08-08 DIAGNOSIS — I10 Essential (primary) hypertension: Secondary | ICD-10-CM | POA: Diagnosis present

## 2023-08-08 DIAGNOSIS — F419 Anxiety disorder, unspecified: Secondary | ICD-10-CM | POA: Diagnosis present

## 2023-08-08 DIAGNOSIS — I5033 Acute on chronic diastolic (congestive) heart failure: Secondary | ICD-10-CM | POA: Diagnosis not present

## 2023-08-08 DIAGNOSIS — F0393 Unspecified dementia, unspecified severity, with mood disturbance: Secondary | ICD-10-CM | POA: Diagnosis present

## 2023-08-08 DIAGNOSIS — Z6831 Body mass index (BMI) 31.0-31.9, adult: Secondary | ICD-10-CM

## 2023-08-08 DIAGNOSIS — Z87442 Personal history of urinary calculi: Secondary | ICD-10-CM

## 2023-08-08 DIAGNOSIS — J9601 Acute respiratory failure with hypoxia: Secondary | ICD-10-CM | POA: Diagnosis not present

## 2023-08-08 DIAGNOSIS — Z823 Family history of stroke: Secondary | ICD-10-CM

## 2023-08-08 DIAGNOSIS — D696 Thrombocytopenia, unspecified: Secondary | ICD-10-CM | POA: Diagnosis present

## 2023-08-08 DIAGNOSIS — N1831 Chronic kidney disease, stage 3a: Secondary | ICD-10-CM | POA: Diagnosis present

## 2023-08-08 DIAGNOSIS — G9341 Metabolic encephalopathy: Secondary | ICD-10-CM | POA: Diagnosis present

## 2023-08-08 DIAGNOSIS — U071 COVID-19: Secondary | ICD-10-CM | POA: Diagnosis present

## 2023-08-08 DIAGNOSIS — Z90710 Acquired absence of both cervix and uterus: Secondary | ICD-10-CM

## 2023-08-08 DIAGNOSIS — Z79899 Other long term (current) drug therapy: Secondary | ICD-10-CM

## 2023-08-08 DIAGNOSIS — I130 Hypertensive heart and chronic kidney disease with heart failure and stage 1 through stage 4 chronic kidney disease, or unspecified chronic kidney disease: Secondary | ICD-10-CM | POA: Diagnosis present

## 2023-08-08 DIAGNOSIS — R7881 Bacteremia: Secondary | ICD-10-CM | POA: Diagnosis not present

## 2023-08-08 DIAGNOSIS — K7581 Nonalcoholic steatohepatitis (NASH): Secondary | ICD-10-CM | POA: Diagnosis present

## 2023-08-08 DIAGNOSIS — F0394 Unspecified dementia, unspecified severity, with anxiety: Secondary | ICD-10-CM | POA: Diagnosis present

## 2023-08-08 DIAGNOSIS — K7469 Other cirrhosis of liver: Secondary | ICD-10-CM

## 2023-08-08 DIAGNOSIS — B9620 Unspecified Escherichia coli [E. coli] as the cause of diseases classified elsewhere: Secondary | ICD-10-CM | POA: Diagnosis present

## 2023-08-08 DIAGNOSIS — Z9049 Acquired absence of other specified parts of digestive tract: Secondary | ICD-10-CM

## 2023-08-08 DIAGNOSIS — E785 Hyperlipidemia, unspecified: Secondary | ICD-10-CM | POA: Diagnosis present

## 2023-08-08 DIAGNOSIS — E039 Hypothyroidism, unspecified: Secondary | ICD-10-CM | POA: Diagnosis present

## 2023-08-08 DIAGNOSIS — Z96653 Presence of artificial knee joint, bilateral: Secondary | ICD-10-CM | POA: Diagnosis present

## 2023-08-08 HISTORY — DX: Fatty (change of) liver, not elsewhere classified: K76.0

## 2023-08-08 HISTORY — DX: Unspecified dementia, unspecified severity, without behavioral disturbance, psychotic disturbance, mood disturbance, and anxiety: F03.90

## 2023-08-08 LAB — PHOSPHORUS: Phosphorus: 1.3 mg/dL — ABNORMAL LOW (ref 2.5–4.6)

## 2023-08-08 LAB — CBC WITH DIFFERENTIAL/PLATELET
Abs Immature Granulocytes: 0.03 10*3/uL (ref 0.00–0.07)
Basophils Absolute: 0 10*3/uL (ref 0.0–0.1)
Basophils Relative: 1 %
Eosinophils Absolute: 0 10*3/uL (ref 0.0–0.5)
Eosinophils Relative: 1 %
HCT: 31.7 % — ABNORMAL LOW (ref 36.0–46.0)
Hemoglobin: 10.1 g/dL — ABNORMAL LOW (ref 12.0–15.0)
Immature Granulocytes: 1 %
Lymphocytes Relative: 7 %
Lymphs Abs: 0.4 10*3/uL — ABNORMAL LOW (ref 0.7–4.0)
MCH: 28 pg (ref 26.0–34.0)
MCHC: 31.9 g/dL (ref 30.0–36.0)
MCV: 87.8 fL (ref 80.0–100.0)
Monocytes Absolute: 0 10*3/uL — ABNORMAL LOW (ref 0.1–1.0)
Monocytes Relative: 1 %
Neutro Abs: 5.3 10*3/uL (ref 1.7–7.7)
Neutrophils Relative %: 89 %
Platelets: 137 10*3/uL — ABNORMAL LOW (ref 150–400)
RBC: 3.61 MIL/uL — ABNORMAL LOW (ref 3.87–5.11)
RDW: 14.4 % (ref 11.5–15.5)
WBC: 5.8 10*3/uL (ref 4.0–10.5)
nRBC: 0 % (ref 0.0–0.2)

## 2023-08-08 LAB — RESP PANEL BY RT-PCR (RSV, FLU A&B, COVID)  RVPGX2
Influenza A by PCR: NEGATIVE
Influenza B by PCR: NEGATIVE
Resp Syncytial Virus by PCR: NEGATIVE
SARS Coronavirus 2 by RT PCR: POSITIVE — AB

## 2023-08-08 LAB — AMMONIA: Ammonia: 26 umol/L (ref 9–35)

## 2023-08-08 LAB — MAGNESIUM: Magnesium: 1.8 mg/dL (ref 1.7–2.4)

## 2023-08-08 LAB — COMPREHENSIVE METABOLIC PANEL
ALT: 18 U/L (ref 0–44)
AST: 40 U/L (ref 15–41)
Albumin: 3.8 g/dL (ref 3.5–5.0)
Alkaline Phosphatase: 78 U/L (ref 38–126)
Anion gap: 17 — ABNORMAL HIGH (ref 5–15)
BUN: 14 mg/dL (ref 8–23)
CO2: 19 mmol/L — ABNORMAL LOW (ref 22–32)
Calcium: 8.7 mg/dL — ABNORMAL LOW (ref 8.9–10.3)
Chloride: 103 mmol/L (ref 98–111)
Creatinine, Ser: 1.1 mg/dL — ABNORMAL HIGH (ref 0.44–1.00)
GFR, Estimated: 50 mL/min — ABNORMAL LOW (ref >60)
Glucose, Bld: 111 mg/dL — ABNORMAL HIGH (ref 70–99)
Potassium: 3.2 mmol/L — ABNORMAL LOW (ref 3.5–5.1)
Sodium: 139 mmol/L (ref 135–145)
Total Bilirubin: 3.1 mg/dL — ABNORMAL HIGH (ref 0.0–1.2)
Total Protein: 7.1 g/dL (ref 6.5–8.1)

## 2023-08-08 LAB — TRIGLYCERIDES: Triglycerides: 90 mg/dL (ref <150)

## 2023-08-08 LAB — LACTIC ACID, PLASMA
Lactic Acid, Venous: 3.7 mmol/L (ref 0.5–1.9)
Lactic Acid, Venous: 3 mmol/L (ref 0.5–1.9)

## 2023-08-08 LAB — BRAIN NATRIURETIC PEPTIDE: B Natriuretic Peptide: 1199.6 pg/mL — ABNORMAL HIGH (ref 0.0–100.0)

## 2023-08-08 LAB — LIPASE, BLOOD: Lipase: 87 U/L — ABNORMAL HIGH (ref 11–51)

## 2023-08-08 MED ORDER — SODIUM CHLORIDE 0.9 % IV SOLN
2.0000 g | INTRAVENOUS | Status: DC
Start: 2023-08-09 — End: 2023-08-12
  Administered 2023-08-09 – 2023-08-11 (×3): 2 g via INTRAVENOUS
  Filled 2023-08-08 (×3): qty 20

## 2023-08-08 MED ORDER — POLYETHYLENE GLYCOL 3350 17 G PO PACK
17.0000 g | PACK | Freq: Every day | ORAL | Status: DC | PRN
  Administered 2023-08-14: 17 g via ORAL
  Filled 2023-08-08: qty 1

## 2023-08-08 MED ORDER — ENOXAPARIN SODIUM 60 MG/0.6ML IJ SOSY
0.5000 mg/kg | PREFILLED_SYRINGE | INTRAMUSCULAR | Status: DC
  Administered 2023-08-08 – 2023-08-14 (×7): 45 mg via SUBCUTANEOUS
  Filled 2023-08-08 (×7): qty 0.6

## 2023-08-08 MED ORDER — LACTATED RINGERS IV BOLUS
1000.0000 mL | Freq: Once | INTRAVENOUS | Status: AC
  Administered 2023-08-08: 1000 mL via INTRAVENOUS

## 2023-08-08 MED ORDER — MORPHINE SULFATE (PF) 2 MG/ML IV SOLN: 2.0000 mg | INTRAVENOUS | Status: DC | PRN

## 2023-08-08 MED ORDER — IOHEXOL 300 MG/ML  SOLN
100.0000 mL | Freq: Once | INTRAMUSCULAR | Status: AC | PRN
  Administered 2023-08-08: 100 mL via INTRAVENOUS

## 2023-08-08 MED ORDER — ONDANSETRON HCL 4 MG/2ML IJ SOLN: 4.0000 mg | Freq: Three times a day (TID) | INTRAMUSCULAR | Status: DC | PRN

## 2023-08-08 MED ORDER — DIPHENHYDRAMINE HCL 50 MG/ML IJ SOLN: 12.5000 mg | Freq: Three times a day (TID) | INTRAMUSCULAR | Status: DC | PRN

## 2023-08-08 MED ORDER — ACETAMINOPHEN 325 MG PO TABS
650.0000 mg | ORAL_TABLET | Freq: Four times a day (QID) | ORAL | Status: DC | PRN
  Filled 2023-08-08: qty 2

## 2023-08-08 MED ORDER — ONDANSETRON HCL 4 MG/2ML IJ SOLN
4.0000 mg | Freq: Once | INTRAMUSCULAR | Status: AC
  Administered 2023-08-08: 4 mg via INTRAVENOUS
  Filled 2023-08-08: qty 2

## 2023-08-08 MED ORDER — SODIUM CHLORIDE 0.9 % IV SOLN
2.0000 g | Freq: Once | INTRAVENOUS | Status: AC
  Administered 2023-08-08: 2 g via INTRAVENOUS
  Filled 2023-08-08: qty 20

## 2023-08-08 MED ORDER — HYDRALAZINE HCL 20 MG/ML IJ SOLN
5.0000 mg | INTRAMUSCULAR | Status: DC | PRN
  Administered 2023-08-11 (×2): 5 mg via INTRAVENOUS
  Filled 2023-08-08 (×2): qty 1

## 2023-08-08 MED ORDER — DM-GUAIFENESIN ER 30-600 MG PO TB12
1.0000 | ORAL_TABLET | Freq: Two times a day (BID) | ORAL | Status: DC | PRN
  Administered 2023-08-09: 1 via ORAL
  Filled 2023-08-08: qty 1

## 2023-08-08 MED ORDER — SODIUM CHLORIDE 0.9 % IV SOLN: INTRAVENOUS | Status: DC

## 2023-08-08 MED ORDER — METRONIDAZOLE 500 MG/100ML IV SOLN
500.0000 mg | Freq: Once | INTRAVENOUS | Status: AC
  Administered 2023-08-08: 500 mg via INTRAVENOUS
  Filled 2023-08-08: qty 100

## 2023-08-08 MED ORDER — POTASSIUM CHLORIDE CRYS ER 20 MEQ PO TBCR
40.0000 meq | EXTENDED_RELEASE_TABLET | Freq: Once | ORAL | Status: AC
  Administered 2023-08-08: 40 meq via ORAL
  Filled 2023-08-08: qty 2

## 2023-08-08 MED ORDER — ALBUTEROL SULFATE (2.5 MG/3ML) 0.083% IN NEBU: 2.5000 mg | INHALATION_SOLUTION | RESPIRATORY_TRACT | Status: DC | PRN

## 2023-08-08 MED ORDER — SODIUM CHLORIDE 0.9 % IV BOLUS (SEPSIS)
1000.0000 mL | Freq: Once | INTRAVENOUS | Status: AC
  Administered 2023-08-08: 1000 mL via INTRAVENOUS

## 2023-08-08 MED ORDER — LACTATED RINGERS IV SOLN: INTRAVENOUS | Status: DC

## 2023-08-08 MED ORDER — OXYCODONE HCL 5 MG PO TABS: 5.0000 mg | ORAL_TABLET | Freq: Four times a day (QID) | ORAL | Status: DC | PRN

## 2023-08-08 MED ORDER — POTASSIUM PHOSPHATES 15 MMOLE/5ML IV SOLN
15.0000 mmol | Freq: Once | INTRAVENOUS | Status: AC
  Administered 2023-08-09: 15 mmol via INTRAVENOUS
  Filled 2023-08-08: qty 5

## 2023-08-08 NOTE — ED Provider Notes (Addendum)
[facility] Provider Note    Event Date/Time   First MD Initiated Contact with Patient 08/08/23 1938     (approximate)   History   Altered Mental Status   HPI  Anita Bennett is a 83 y.o. female past medical history significant for dementia, [name], who presents to the emergency department for altered mental status.  Patient lives at nursing facility but normally takes care of herself on her own.  Patient is here with her daughter who is at bedside.  Yesterday was evaluated at her primary care physician office and we will called in antibiotics for possible urinary tract infection.  Today she had significantly worsening altered mental status with not opening her eyes or responding.  Episode of nausea and vomiting.  Decreased p.o. intake today.  No known significant falls or head trauma.     Physical Exam   Triage Vital Signs: ED Triage Vitals  Encounter Vitals Group     BP 08/08/23 1737 (!) 177/69     Systolic BP Percentile --      Diastolic BP Percentile --      Pulse Rate 08/08/23 1737 78     Resp 08/08/23 1737 (!) 22     Temp 08/08/23 1737 99.3 F (37.4 C)     Temp Source 08/08/23 1737 Oral     SpO2 08/08/23 1737 92 %     Weight 08/08/23 1739 200 lb (90.7 kg)     Height 08/08/23 1739 5\' 8"  (1.727 m)     Head Circumference --      Peak Flow --      Pain Score --      Pain Loc --      Pain Education --      Exclude from Growth Chart --     Most recent vital signs: Vitals:   08/08/23 2100 08/08/23 2130  BP: (!) 118/58 (!) 130/51  Pulse: 80 80  Resp: (!) 24 (!) 22  Temp:    SpO2: 98% 98%    Physical Exam Exam conducted with a chaperone present.  Constitutional:      Appearance: She is well-developed. She is ill-appearing.  HENT:     Head: Atraumatic.  Eyes:     Conjunctiva/sclera: Conjunctivae normal.     Pupils: Pupils are equal, round, and reactive to light.  Cardiovascular:     Rate and Rhythm: Regular rhythm.  Pulmonary:      Effort: No respiratory distress.  Abdominal:     General: There is no distension.     Tenderness: There is abdominal tenderness (Mild diffuse abdominal tenderness to palpation that is worse in the right upper abdomen.).  Genitourinary:    Comments: DRE w/o stool in rectal vault Musculoskeletal:        General: Normal range of motion.     Cervical back: Normal range of motion.  Skin:    General: Skin is warm.     Capillary Refill: Capillary refill takes 2 to 3 seconds.  Neurological:     Mental Status: She is alert. She is disoriented.     IMPRESSION / MDM / ASSESSMENT AND PLAN / ED COURSE  I reviewed the triage vital signs and the nursing notes.  Differential diagnosis including urinary tract infection, sepsis, electrolyte abnormality, dehydration, intracranial hemorrhage, viral illness including COVID/influenza, urinary tract infection, intra-abdominal infection including acute cholecystitis or choledocholithiasis  Blood cultures obtained.  EKG  I, Corena Herter, the attending physician, personally viewed and interpreted this  ECG.   Rate: Normal  Rhythm: Normal sinus  Axis: Normal  Intervals: Prolonged QTc at 507  ST&T Change: None  No tachycardic or bradycardic dysrhythmias while on cardiac telemetry.  RADIOLOGY I independently reviewed imaging, my interpretation of imaging: Chest x-ray no signs of pneumonia.  CT scan of the head was read as no acute findings.  CT scan abdomen and pelvis ordered -findings of cirrhosis.  Under distended bladder.  Findings of cirrhosis with mild splenomegaly.  Mild fecal impaction with no stercoral colitis.  LABS (all labs ordered are listed, but only abnormal results are displayed) Labs interpreted as -    Labs Reviewed  RESP PANEL BY RT-PCR (RSV, FLU A&B, COVID)  RVPGX2 - Abnormal; Notable for the following components:      Result Value   SARS Coronavirus 2 by RT PCR POSITIVE (*)    All other components within normal limits  CBC  WITH DIFFERENTIAL/PLATELET - Abnormal; Notable for the following components:   RBC 3.61 (*)    Hemoglobin 10.1 (*)    HCT 31.7 (*)    Platelets 137 (*)    Lymphs Abs 0.4 (*)    Monocytes Absolute 0.0 (*)    All other components within normal limits  COMPREHENSIVE METABOLIC PANEL - Abnormal; Notable for the following components:   Potassium 3.2 (*)    CO2 19 (*)    Glucose, Bld 111 (*)    Creatinine, Ser 1.10 (*)    Calcium 8.7 (*)    Total Bilirubin 3.1 (*)    GFR, Estimated 50 (*)    Anion gap 17 (*)    All other components within normal limits  LIPASE, BLOOD - Abnormal; Notable for the following components:   Lipase 87 (*)    All other components within normal limits  LACTIC ACID, PLASMA - Abnormal; Notable for the following components:   Lactic Acid, Venous 3.7 (*)    All other components within normal limits  LACTIC ACID, PLASMA - Abnormal; Notable for the following components:   Lactic Acid, Venous 3.0 (*)    All other components within normal limits  PHOSPHORUS - Abnormal; Notable for the following components:   Phosphorus 1.3 (*)    All other components within normal limits  CULTURE, BLOOD (ROUTINE X 2)  CULTURE, BLOOD (ROUTINE X 2)  URINE CULTURE  AMMONIA  MAGNESIUM  URINALYSIS, ROUTINE W REFLEX MICROSCOPIC  TRIGLYCERIDES  BRAIN NATRIURETIC PEPTIDE  COMPREHENSIVE METABOLIC PANEL  CBC     MDM  COVID testing is positive.  Initial lactic acid elevated at 3.7.  Elevated lipase and T. bili at 3.1.  Elevated anion gap of 17 which is likely in the setting of elevated lactic acid.  Felt that 30 cc/kg of IV fluids may be detrimental given good perfusion, will give 1 L of IV fluids and reevaluate.  On chart review patient had findings consistent with urinary tract infection yesterday.  Started on IV Rocephin and added on Flagyl for possible intra-abdominal.  On reevaluation downtrending lactic acid.  Added on ammonia level given cirrhosis and altered mental status.  CT  scan with findings concerning for mild acute pancreatitis.  Cholecystectomy.  No hepatic duct dilation.  Consulted hospitalist for admission for sepsis with concern for urinary tract infection, COVID and mild pancreatitis.     PROCEDURES:  Critical Care performed: yes  .Critical Care  Performed by: Corena Herter, MD Authorized by: Corena Herter, MD   Critical care provider statement:    Critical care time (  minutes):  35   Critical care time was exclusive of:  Separately billable procedures and treating other patients   Critical care was necessary to treat or prevent imminent or life-threatening deterioration of the following conditions:  Sepsis   Critical care was time spent personally by me on the following activities:  Development of treatment plan with patient or surrogate, discussions with consultants, evaluation of patient's response to treatment, examination of patient, ordering and review of laboratory studies, ordering and review of radiographic studies, ordering and performing treatments and interventions, pulse oximetry, re-evaluation of patient's condition and review of old charts   Care discussed with: admitting provider     Patient's presentation is most consistent with acute presentation with potential threat to life or bodily function.   MEDICATIONS ORDERED IN ED: Medications  lactated ringers infusion (0 mLs Intravenous Hold 08/08/23 2029)  diphenhydrAMINE (BENADRYL) injection 12.5 mg (has no administration in time range)  morphine (PF) 2 MG/ML injection 2 mg (has no administration in time range)  oxyCODONE (Oxy IR/ROXICODONE) immediate release tablet 5 mg (has no administration in time range)  albuterol (PROVENTIL) (2.5 MG/3ML) 0.083% nebulizer solution 2.5 mg (has no administration in time range)  dextromethorphan-guaiFENesin (MUCINEX DM) 30-600 MG per 12 hr tablet 1 tablet (has no administration in time range)  polyethylene glycol (MIRALAX / GLYCOLAX) packet 17 g  (has no administration in time range)  cefTRIAXone (ROCEPHIN) 2 g in sodium chloride 0.9 % 100 mL IVPB (has no administration in time range)  hydrALAZINE (APRESOLINE) injection 5 mg (has no administration in time range)  acetaminophen (TYLENOL) tablet 650 mg (has no administration in time range)  potassium chloride SA (KLOR-CON M) CR tablet 40 mEq (has no administration in time range)  0.9 %  sodium chloride infusion (has no administration in time range)  enoxaparin (LOVENOX) injection 45 mg (has no administration in time range)  sodium chloride 0.9 % bolus 1,000 mL (0 mLs Intravenous Stopped 08/08/23 2152)  cefTRIAXone (ROCEPHIN) 2 g in sodium chloride 0.9 % 100 mL IVPB (0 g Intravenous Stopped 08/08/23 2109)  ondansetron (ZOFRAN) injection 4 mg (4 mg Intravenous Given 08/08/23 2023)  metroNIDAZOLE (FLAGYL) IVPB 500 mg (0 mg Intravenous Stopped 08/08/23 2152)  iohexol (OMNIPAQUE) 300 MG/ML solution 100 mL (100 mLs Intravenous Contrast Given 08/08/23 2034)  lactated ringers bolus 1,000 mL (1,000 mLs Intravenous New Bag/Given 08/08/23 2235)    FINAL CLINICAL IMPRESSION(S) / ED DIAGNOSES   Final diagnoses:  Sepsis, due to unspecified organism, unspecified whether acute organ dysfunction present [facility])     Rx / DC Orders   ED Discharge Orders     None        Note:  This document was prepared using Dragon voice recognition software and may include unintentional dictation errors.   Corena Herter, MD 08/08/23 1610    Corena Herter, MD 08/08/23 9604    Corena Herter, MD 08/08/23 2310

## 2023-08-08 NOTE — H&P (Signed)
History and Physical    Anita Bennett QIH:[id number] DOB: [birth year] DOA: 08/08/2023  Referring MD/NP/PA:   PCP: Wilford Corner, PA-C   Patient coming from:  The patient is coming from SNF.     Chief Complaint: AMS  HPI: Anita Bennett is a 83 y.o. female with medical history significant of HTN, HLD, dCHF, hypothyroidism, dementia, depression with anxiety, CKD-3A, liver cirrhosis secondary to NAFLD, kidney stone, obesity, who presents with altered mental status.  Patient has AMS,  and is unable to provide accurate medical history, therefore, most of the history is obtained by discussing the case with ED physician, per EMS report, and with the nursing staff. I have tried to call her daughter who did not pick up the phone in midnight. I left a message to her.  Per report, patient is normally independent, but is noticed to be confused in the past several days.  No significant focal or head injury.  Pt was seen by PCP yesterday, and had positive urinalysis for UTI.  Primary care physician will call in antibiotics, but has not started yet.  Per report, patient has nausea  and vomiting.  When I saw patient in ED, patient is confused, knows her own name, not orientated to place and time.  She moves all extremities.  No active cough, respiratory distress noted.  Does not seem to have abdominal pain or chest pain.  Not sure if patient has symptoms of UTI.   Data reviewed independently and ED Course: pt was found to have positive flu PCR, WBC 5.8, lactic acid 3.7, 3.0, BNP 1199, ammonia 26, triglyceride 90, lipase 87, potassium 3.2, renal function close to baseline, liver function okay except for total bilirubin 3.1.  Temperature 99.3, blood pressure 177/69, 130/51, heart rate 80, RR 26, oxygen desaturation to 86% on room air, which improved to 92-98% on 3 L oxygen.  CT of head is negative for acute intracranial abnormalities.  Patient is admitted to telemetry bed as inpatient.   CXR: Chronic  appearing increased interstitial lung markings with mild, stable areas of linear scarring and/or atelectasis within the mid right lung and left lung base.   CT abdomen/pelvis: Suspected mild acute pancreatitis.   Thick-walled bladder, although underdistended. Correlate for cystitis.   Cirrhosis with mild splenomegaly.   Suspected mild rectal fecal impaction, without stercoral colitis.   Trace bilateral pleural effusions.     EKG: I have personally reviewed.  Sinus rhythm, QTc 507, nonspecific T wave change.   Review of Systems: Could not be reviewed due to altered mental status.   Allergy:  Allergies  Allergen Reactions   Epinephrine     Heart race   Promethazine Hcl     hyperactivity   Statins     Past Medical History:  Diagnosis Date   Arrhythmia    Basal cell carcinoma 01/31/2009   right prox dorsum forearm   BCC (basal cell carcinoma of skin) 08/21/2022   BASAL CELL CARCINOMA, NODULAR PATTERN, DEEP MARGIN INVOLVED at right nasal tip. Mohs pending   Dementia (HCC)    Dysplastic nevus 12/05/2022   upper mid back - moderate to severe, needs excision or shave removal   H/O knee surgery    2011 [facility] knee (replacement)   Heart murmur    History of kidney stones    History of toe surgery    12/01/14 [facility]   Hyperlipidemia    Hypertension    Non-alcoholic fatty liver disease    Thyroid disease  Past Surgical History:  Procedure Laterality Date   arm surgery     BACK SURGERY     CHOLECYSTECTOMY     TOE SURGERY     TOTAL ABDOMINAL HYSTERECTOMY     TOTAL KNEE ARTHROPLASTY Bilateral     Social History:  reports that she has never smoked. She has never used smokeless tobacco. She reports that she does not drink alcohol and does not use drugs.  Family History:  Family History  Problem Relation Age of Onset   Stroke Mother      Prior to Admission medications   Medication Sig Start Date End Date Taking? Authorizing Provider  acetaminophen (TYLENOL) 500  MG tablet Take 500 mg by mouth every 6 (six) hours as needed.    [provider]  ALPRAZolam Prudy Feeler) 0.5 MG tablet Take 1 tablet (0.5 mg total) by mouth daily as needed for anxiety. 06/16/18   Karamalegos, Netta Neat, DO  diclofenac sodium (VOLTAREN) 1 % GEL Apply 2 g topically 3 (three) times daily as needed. For hands, osteoarthritis 03/18/18   Smitty Cords, DO  Lactulose 20 GM/30ML SOLN Take one dose every other day 04/14/19   Smitty Cords, DO  levothyroxine (SYNTHROID, LEVOTHROID) 100 MCG tablet Take 1 tablet (100 mcg total) by mouth daily. 03/20/18   Karamalegos, Netta Neat, DO  metoprolol succinate (TOPROL-XL) 25 MG 24 hr tablet Take 1 tablet (25 mg total) by mouth daily. 03/20/18   Karamalegos, Netta Neat, DO  MULTIPLE VITAMINS PO Take by mouth.    [provider]  MYRBETRIQ 50 MG TB24 tablet Take 50 mg by mouth daily. 11/02/19   [provider]  oxyCODONE (OXY IR/ROXICODONE) 5 MG immediate release tablet oxycodone 5 mg tablet    [provider]  PARoxetine (PAXIL) 20 MG tablet Take 1 tablet (20 mg total) by mouth daily. 03/20/18   Karamalegos, Netta Neat, DO  polyethylene glycol (MIRALAX / GLYCOLAX) packet Take 17 g by mouth daily as needed for moderate constipation. Self admin may have in room 10/03/18   Althea Charon, Netta Neat, DO  rifaximin (XIFAXAN) 550 MG TABS tablet Take 550 mg by mouth 2 (two) times daily.    [provider]  vitamin B-12 (CYANOCOBALAMIN) 1000 MCG tablet Take 1 tablet (1,000 mcg total) by mouth daily. 03/18/18   Smitty Cords, DO    Physical Exam: Vitals:   08/08/23 1739 08/08/23 2016 08/08/23 2100 08/08/23 2130  BP:  (!) 141/54 (!) 118/58 (!) 130/51  Pulse:  78 80 80  Resp:  (!) 26 (!) 24 (!) 22  Temp:  99 F (37.2 C)    TempSrc:  Oral    SpO2:  92% 98% 98%  Weight: 90.7 kg     Height: 5\' 8"  (1.727 m)      General: Not in acute distress HEENT:       Eyes: PERRL, EOMI, no  jaundice       ENT: No discharge from the ears and nose       Neck: No JVD, no bruit, no mass felt. Heme: No neck lymph node enlargement. Cardiac: S1/S2, RRR, No murmurs, No gallops or rubs. Respiratory: No rales, wheezing, rhonchi or rubs. GI: Soft, nondistended, nontender, BS present. GU: No hematuria Ext: has trace pitting leg edema bilaterally. 1+DP/PT pulse bilaterally. Musculoskeletal: No joint deformities, No joint redness or warmth, no limitation of ROM in spin. Skin: No rashes.  Neuro: Confused, knows her own name, not orientated to place and time. Cranial  nerves II-XII grossly intact, moves all extremities  Psych: Patient is not psychotic, no suicidal or hemocidal ideation.  Labs on Admission: I have personally reviewed following labs and imaging studies  CBC: Recent Labs  Lab 08/08/23 1744  WBC 5.8  NEUTROABS 5.3  HGB 10.1*  HCT 31.7*  MCV 87.8  PLT 137*   Basic Metabolic Panel: Recent Labs  Lab 08/08/23 1744  NA 139  K 3.2*  CL 103  CO2 19*  GLUCOSE 111*  BUN 14  CREATININE 1.10*  CALCIUM 8.7*  MG 1.8  PHOS 1.3*   GFR: Estimated Creatinine Clearance: 44.8 mL/min (A) (by C-G formula based on SCr of 1.1 mg/dL (H)). Liver Function Tests: Recent Labs  Lab 08/08/23 1744  AST 40  ALT 18  ALKPHOS 78  BILITOT 3.1*  PROT 7.1  ALBUMIN 3.8   Recent Labs  Lab 08/08/23 1744  LIPASE 87*   Recent Labs  Lab 08/08/23 2234  AMMONIA 26   Coagulation Profile: No results for input(s): "INR", "PROTIME" in the last 168 hours. Cardiac Enzymes: No results for input(s): "CKTOTAL", "CKMB", "CKMBINDEX", "TROPONINI" in the last 168 hours. BNP (last 3 results) No results for input(s): "PROBNP" in the last 8760 hours. HbA1C: No results for input(s): "HGBA1C" in the last 72 hours. CBG: No results for input(s): "GLUCAP" in the last 168 hours. Lipid Profile: Recent Labs    08/08/23 1744  TRIG 90   Thyroid Function Tests: No results for input(s): "TSH",  "T4TOTAL", "FREET4", "T3FREE", "THYROIDAB" in the last 72 hours. Anemia Panel: No results for input(s): "VITAMINB12", "FOLATE", "FERRITIN", "TIBC", "IRON", "RETICCTPCT" in the last 72 hours. Urine analysis:    Component Value Date/Time   COLORURINE STRAW (A) 11/12/2020 2201   APPEARANCEUR CLEAR (A) 11/12/2020 2201   APPEARANCEUR Clear 06/30/2018 1434   LABSPEC 1.004 (L) 11/12/2020 2201   PHURINE 7.0 11/12/2020 2201   GLUCOSEU NEGATIVE 11/12/2020 2201   HGBUR NEGATIVE 11/12/2020 2201   BILIRUBINUR NEGATIVE 11/12/2020 2201   BILIRUBINUR Negative 06/30/2018 1434   KETONESUR NEGATIVE 11/12/2020 2201   PROTEINUR NEGATIVE 11/12/2020 2201   UROBILINOGEN 0.2 05/15/2018 1542   NITRITE NEGATIVE 11/12/2020 2201   LEUKOCYTESUR NEGATIVE 11/12/2020 2201   Sepsis Labs: @LABRCNTIP (procalcitonin:4,lacticidven:4) ) Recent Results (from the past 240 hours)  Resp panel by RT-PCR (RSV, Flu A&B, Covid) Anterior Nasal Swab     Status: Abnormal   Collection Time: 08/08/23  5:44 PM   Specimen: Anterior Nasal Swab  Result Value Ref Range Status   SARS Coronavirus 2 by RT PCR POSITIVE (A) NEGATIVE Final    Comment: (NOTE) SARS-CoV-2 target nucleic acids are DETECTED.  The SARS-CoV-2 RNA is generally detectable in upper respiratory specimens during the acute phase of infection. Positive results are indicative of the presence of the identified virus, but do not rule out bacterial infection or co-infection with other pathogens not detected by the test. Clinical correlation with patient history and other diagnostic information is necessary to determine patient infection status. The expected result is Negative.  Fact Sheet for Patients: BloggerCourse.com  Fact Sheet for Healthcare Providers: SeriousBroker.it  This test is not yet approved or cleared by the [place] FDA and  has been authorized for detection and/or diagnosis of SARS-CoV-2 by FDA  under an Emergency Use Authorization (EUA).  This EUA will remain in effect (meaning this test can be used) for the duration of  the COVID-19 declaration under Section 564(b)(1) of the A ct, 21 U.S.C. section 360bbb-3(b)(1), unless the authorization is terminated  or revoked sooner.     Influenza A by PCR NEGATIVE NEGATIVE Final   Influenza B by PCR NEGATIVE NEGATIVE Final    Comment: (NOTE) The Xpert Xpress SARS-CoV-2/FLU/RSV plus assay is intended as an aid in the diagnosis of influenza from Nasopharyngeal swab specimens and should not be used as a sole basis for treatment. Nasal washings and aspirates are unacceptable for Xpert Xpress SARS-CoV-2/FLU/RSV testing.  Fact Sheet for Patients: BloggerCourse.com  Fact Sheet for Healthcare Providers: SeriousBroker.it  This test is not yet approved or cleared by the [place] FDA and has been authorized for detection and/or diagnosis of SARS-CoV-2 by FDA under an Emergency Use Authorization (EUA). This EUA will remain in effect (meaning this test can be used) for the duration of the COVID-19 declaration under Section 564(b)(1) of the Act, 21 U.S.C. section 360bbb-3(b)(1), unless the authorization is terminated or revoked.     Resp Syncytial Virus by PCR NEGATIVE NEGATIVE Final    Comment: (NOTE) Fact Sheet for Patients: BloggerCourse.com  Fact Sheet for Healthcare Providers: SeriousBroker.it  This test is not yet approved or cleared by the [place] FDA and has been authorized for detection and/or diagnosis of SARS-CoV-2 by FDA under an Emergency Use Authorization (EUA). This EUA will remain in effect (meaning this test can be used) for the duration of the COVID-19 declaration under Section 564(b)(1) of the Act, 21 U.S.C. section 360bbb-3(b)(1), unless the authorization is terminated or revoked.  Performed at [facility], [address] [place], [place] [address]      Radiological Exams on Admission:   Assessment/Plan Principal Problem:   COVID-19 virus infection Active Problems:   UTI (urinary tract infection)   Acute pancreatitis   Essential hypertension   Adult hypothyroidism   Liver cirrhosis secondary to NASH (nonalcoholic steatohepatitis) (HCC)   Chronic diastolic CHF (congestive heart failure) (HCC)   Hypokalemia   Hypophosphatemia   Acute metabolic encephalopathy   Chronic kidney disease, stage 3a (HCC)   Anxiety   Obesity (BMI 35.0-39.9 without comorbidity)   Assessment and Plan:  COVID-19 virus infection: Patient temperature 99.3, no clear infiltration on chest x-ray.  Patient has 3 L new oxygen requirement, which is likely due to combination of CHF and COVID infection.  -Admit to telemetry bed as inpatient -Bronchodilators and as needed Mucinex -Solu-Medrol 40 mg twice daily -Nasal cannula oxygen to maintain oxygen saturation above 93%   UTI (urinary tract infection): Patient had urinalysis done in clinic, which showed cloudy appearance, 3+ leukocyte, bacteria> 50 and WBC > 50 -Rocephin -Pending repeated urinalysis which is ordered by EDP -Blood culture and urine culture  Acute pancreatitis: Lipase 87, CT scan showed possible mild pancreatitis.  TG level normal 90.  Liver function okay except for total bilirubin 0.1, which is likely due to liver cirrhosis. -As needed morphine, oxycodone for pain -NPO now -IV fluid: Patient received 1 L normal saline, will not continue IV fluid since patient has a significant elevated BNP 1199  Essential hypertension -IV hydralazine as needed -Nadolol and lisinopril  Adult hypothyroidism -Synthroid  Liver cirrhosis secondary to NASH (nonalcoholic steatohepatitis) (HCC): Ammonia level normal 26. -Continue home lactulose 20 g every other day  Chronic diastolic CHF (congestive heart failure) (HCC): 2D echo on 03/18/2023  showed EF> 55% with grade 2 diastolic dysfunction.  Patient has trace leg edema, but has significantly elevated BNP 1199, indicating fluid overload. -Will give 1 dose of Lasix, 20 mg now  Hypokalemia: Potassium 3.2 -Repleted potassium  Hypophosphatemia: Phosphorus  1.3 -Repleted phosphorus with 15 mmol of potassium phosphate  Acute metabolic encephalopathy: Likely multifactorial etiology, including UTI, COVID infection.  CT head negative. -Neurocheck -Fall precaution  Chronic kidney disease, stage 3a (HCC): Renal function close to baseline.  Recent baseline creatinine 0.95 on 05/09/2022.  Her creatinine is 1.10, BUN 14, GFR 50. -Follow-up with BMP  Anxiety -As needed Xanax   Obesity (BMI 35.0-39.9 without comorbidity): Body weight 90.7 kg, BMI 30.41 -When patient mental status improves, will need counseling about importance of losing weight, taking healthy diet and do more exercise      DVT ppx:  SQ Lovenox  Code Status: Full code   Family Communication:  I have tried to call her daughter who did not pick up the phone, I left a message to her.  Disposition Plan:  Anticipate discharge back to previous environment, SNF  Consults called:  none  Admission status and Level of care: Telemetry Medical:   as inpt        Dispo: The patient is from: SNF              Anticipated d/c is to: SNF              Anticipated d/c date is: 2 days              Patient currently is not medically stable to d/c.    Severity of Illness:  The appropriate patient status for this patient is INPATIENT. Inpatient status is judged to be reasonable and necessary in order to provide the required intensity of service to ensure the patient's safety. The patient's presenting symptoms, physical exam findings, and initial radiographic and laboratory data in the context of their chronic comorbidities is felt to place them at high risk for further clinical deterioration. Furthermore, it is not anticipated  that the patient will be medically stable for discharge from the hospital within 2 midnights of admission.   * I certify that at the point of admission it is my clinical judgment that the patient will require inpatient hospital care spanning beyond 2 midnights from the point of admission due to high intensity of service, high risk for further deterioration and high frequency of surveillance required.*       Date of Service 08/09/2023    Lorretta Harp [facility]   If 7PM-7AM, please contact night-coverage [email or web address] 08/09/2023, 1:10 AM

## 2023-08-08 NOTE — ED Notes (Signed)
Pt placed on 3L [place], room air sats 86%

## 2023-08-08 NOTE — H&P (Incomplete)
History and Physical    Anita Bennett YQM:[id number] DOB: 1940-09-06 DOA: 08/08/2023  Referring MD/NP/PA:   PCP: Wilford Corner, PA-C   Patient coming from:  The patient is coming from SNF.     Chief Complaint: AMS  HPI: Anita Bennett is a 83 y.o. female with medical history significant of HTN, HLD, dCHF, hypothyroidism, dementia, depression with anxiety, CKD-3A, liver cirrhosis secondary to NAFLD, kidney stone, obesity, who presents with altered mental status.  Patient has AMS,  and is unable to provide accurate medical history, therefore, most of the history is obtained by discussing the case with ED physician, per EMS report, and with the nursing staff. I have tried to call her daughter who did not pick up the phone in midnight. I left a message to her.  Per report, patient is normally independent, but is noticed to be confused in the past several days.  No significant focal or head injury.  Pt was seen by PCP yesterday, and had positive urinalysis for UTI.  Primary care physician will call in antibiotics, but has not started yet.  Per report, patient has nausea  and vomiting.  When I saw patient in ED, patient is confused, knows her own name, not orientated to place and time.  She moves all extremities.  No active cough, respiratory distress noted.  Does not seem to have abdominal pain or chest pain.  Not sure if patient has symptoms of UTI.   Data reviewed independently and ED Course: pt was found to have positive flu PCR, WBC 5.8, lactic acid 3.7, 3.0, BNP 1199, ammonia 26, triglyceride 90, lipase 87, potassium 3.2, renal function close to baseline, liver function okay except for total bilirubin 3.1.  Temperature 99.3, blood pressure 177/69, 130/51, heart rate 80, RR 26, oxygen desaturation to 86% on room air, which improved to 92-98% on 3 L oxygen.  Chest x-ray showed chronic scarring change without obvious infiltration.  CT of head is negative for acute intracranial  abnormalities.  Patient is admitted to telemetry bed as inpatient.  CT abdomen/pelvis: Suspected mild acute pancreatitis.   Thick-walled bladder, although underdistended. Correlate for cystitis.   Cirrhosis with mild splenomegaly.   Suspected mild rectal fecal impaction, without stercoral colitis.   Trace bilateral pleural effusions.     EKG: I have personally reviewed.  Sinus rhythm, QTc 507, nonspecific T wave change.   Review of Systems: Could not be reviewed due to altered mental status.   Allergy:  Allergies  Allergen Reactions  . Epinephrine     Heart race  . Promethazine Hcl     hyperactivity  . Statins     Past Medical History:  Diagnosis Date  . Arrhythmia   . Basal cell carcinoma 01/31/2009   right prox dorsum forearm  . BCC (basal cell carcinoma of skin) 08/21/2022   BASAL CELL CARCINOMA, NODULAR PATTERN, DEEP MARGIN INVOLVED at right nasal tip. Mohs pending  . Dementia (HCC)   . Dysplastic nevus 12/05/2022   upper mid back - moderate to severe, needs excision or shave removal  . H/O knee surgery    2011 [facility] knee (replacement)  . Heart murmur   . History of kidney stones   . History of toe surgery    12/01/14 [facility]  . Hyperlipidemia   . Hypertension   . Non-alcoholic fatty liver disease   . Thyroid disease     Past Surgical History:  Procedure Laterality Date  . arm surgery    . BACK  SURGERY    . CHOLECYSTECTOMY    . TOE SURGERY    . TOTAL ABDOMINAL HYSTERECTOMY    . TOTAL KNEE ARTHROPLASTY Bilateral     Social History:  reports that she has never smoked. She has never used smokeless tobacco. She reports that she does not drink alcohol and does not use drugs.  Family History:  Family History  Problem Relation Age of Onset  . Stroke Mother      Prior to Admission medications   Medication Sig Start Date End Date Taking? Authorizing Provider  acetaminophen (TYLENOL) 500 MG tablet Take 500 mg by mouth every 6 (six) hours as needed.     [provider]  ALPRAZolam Prudy Feeler) 0.5 MG tablet Take 1 tablet (0.5 mg total) by mouth daily as needed for anxiety. 06/16/18   Karamalegos, Netta Neat, DO  diclofenac sodium (VOLTAREN) 1 % GEL Apply 2 g topically 3 (three) times daily as needed. For hands, osteoarthritis 03/18/18   Smitty Cords, DO  Lactulose 20 GM/30ML SOLN Take one dose every other day 04/14/19   Smitty Cords, DO  levothyroxine (SYNTHROID, LEVOTHROID) 100 MCG tablet Take 1 tablet (100 mcg total) by mouth daily. 03/20/18   Karamalegos, Netta Neat, DO  metoprolol succinate (TOPROL-XL) 25 MG 24 hr tablet Take 1 tablet (25 mg total) by mouth daily. 03/20/18   Karamalegos, Netta Neat, DO  MULTIPLE VITAMINS PO Take by mouth.    [provider]  MYRBETRIQ 50 MG TB24 tablet Take 50 mg by mouth daily. 11/02/19   [provider]  oxyCODONE (OXY IR/ROXICODONE) 5 MG immediate release tablet oxycodone 5 mg tablet    [provider]  PARoxetine (PAXIL) 20 MG tablet Take 1 tablet (20 mg total) by mouth daily. 03/20/18   Karamalegos, Netta Neat, DO  polyethylene glycol (MIRALAX / GLYCOLAX) packet Take 17 g by mouth daily as needed for moderate constipation. Self admin may have in room 10/03/18   Althea Charon, Netta Neat, DO  rifaximin (XIFAXAN) 550 MG TABS tablet Take 550 mg by mouth 2 (two) times daily.    [provider]  vitamin B-12 (CYANOCOBALAMIN) 1000 MCG tablet Take 1 tablet (1,000 mcg total) by mouth daily. 03/18/18   Smitty Cords, DO    Physical Exam: Vitals:   08/08/23 1739 08/08/23 2016 08/08/23 2100 08/08/23 2130  BP:  (!) 141/54 (!) 118/58 (!) 130/51  Pulse:  78 80 80  Resp:  (!) 26 (!) 24 (!) 22  Temp:  99 F (37.2 C)    TempSrc:  Oral    SpO2:  92% 98% 98%  Weight: 90.7 kg     Height: 5\' 8"  (1.727 m)      General: Not in acute distress HEENT:       Eyes: PERRL, EOMI, no jaundice       ENT: No discharge from the ears and nose       Neck: No  JVD, no bruit, no mass felt. Heme: No neck lymph node enlargement. Cardiac: S1/S2, RRR, No murmurs, No gallops or rubs. Respiratory: No rales, wheezing, rhonchi or rubs. GI: Soft, nondistended, nontender, BS present. GU: No hematuria Ext: has trace pitting leg edema bilaterally. 1+DP/PT pulse bilaterally. Musculoskeletal: No joint deformities, No joint redness or warmth, no limitation of ROM in spin. Skin: No rashes.  Neuro: Confused, knows her own name, not orientated to place and time. Cranial nerves II-XII grossly intact, moves all extremities  Psych: Patient is not psychotic, no suicidal or  hemocidal ideation.  Labs on Admission: I have personally reviewed following labs and imaging studies  CBC: Recent Labs  Lab 08/08/23 1744  WBC 5.8  NEUTROABS 5.3  HGB 10.1*  HCT 31.7*  MCV 87.8  PLT 137*   Basic Metabolic Panel: Recent Labs  Lab 08/08/23 1744  NA 139  K 3.2*  CL 103  CO2 19*  GLUCOSE 111*  BUN 14  CREATININE 1.10*  CALCIUM 8.7*  MG 1.8  PHOS 1.3*   GFR: Estimated Creatinine Clearance: 44.8 mL/min (A) (by C-G formula based on SCr of 1.1 mg/dL (H)). Liver Function Tests: Recent Labs  Lab 08/08/23 1744  AST 40  ALT 18  ALKPHOS 78  BILITOT 3.1*  PROT 7.1  ALBUMIN 3.8   Recent Labs  Lab 08/08/23 1744  LIPASE 87*   Recent Labs  Lab 08/08/23 2234  AMMONIA 26   Coagulation Profile: No results for input(s): "INR", "PROTIME" in the last 168 hours. Cardiac Enzymes: No results for input(s): "CKTOTAL", "CKMB", "CKMBINDEX", "TROPONINI" in the last 168 hours. BNP (last 3 results) No results for input(s): "PROBNP" in the last 8760 hours. HbA1C: No results for input(s): "HGBA1C" in the last 72 hours. CBG: No results for input(s): "GLUCAP" in the last 168 hours. Lipid Profile: Recent Labs    08/08/23 1744  TRIG 90   Thyroid Function Tests: No results for input(s): "TSH", "T4TOTAL", "FREET4", "T3FREE", "THYROIDAB" in the last 72 hours. Anemia  Panel: No results for input(s): "VITAMINB12", "FOLATE", "FERRITIN", "TIBC", "IRON", "RETICCTPCT" in the last 72 hours. Urine analysis:    Component Value Date/Time   COLORURINE STRAW (A) 11/12/2020 2201   APPEARANCEUR CLEAR (A) 11/12/2020 2201   APPEARANCEUR Clear 06/30/2018 1434   LABSPEC 1.004 (L) 11/12/2020 2201   PHURINE 7.0 11/12/2020 2201   GLUCOSEU NEGATIVE 11/12/2020 2201   HGBUR NEGATIVE 11/12/2020 2201   BILIRUBINUR NEGATIVE 11/12/2020 2201   BILIRUBINUR Negative 06/30/2018 1434   KETONESUR NEGATIVE 11/12/2020 2201   PROTEINUR NEGATIVE 11/12/2020 2201   UROBILINOGEN 0.2 05/15/2018 1542   NITRITE NEGATIVE 11/12/2020 2201   LEUKOCYTESUR NEGATIVE 11/12/2020 2201   Sepsis Labs: @LABRCNTIP (procalcitonin:4,lacticidven:4) ) Recent Results (from the past 240 hours)  Resp panel by RT-PCR (RSV, Flu A&B, Covid) Anterior Nasal Swab     Status: Abnormal   Collection Time: 08/08/23  5:44 PM   Specimen: Anterior Nasal Swab  Result Value Ref Range Status   SARS Coronavirus 2 by RT PCR POSITIVE (A) NEGATIVE Final    Comment: (NOTE) SARS-CoV-2 target nucleic acids are DETECTED.  The SARS-CoV-2 RNA is generally detectable in upper respiratory specimens during the acute phase of infection. Positive results are indicative of the presence of the identified virus, but do not rule out bacterial infection or co-infection with other pathogens not detected by the test. Clinical correlation with patient history and other diagnostic information is necessary to determine patient infection status. The expected result is Negative.  Fact Sheet for Patients: BloggerCourse.com  Fact Sheet for Healthcare Providers: SeriousBroker.it  This test is not yet approved or cleared by the [place] FDA and  has been authorized for detection and/or diagnosis of SARS-CoV-2 by FDA under an Emergency Use Authorization (EUA).  This EUA will remain in  effect (meaning this test can be used) for the duration of  the COVID-19 declaration under Section 564(b)(1) of the A ct, 21 U.S.C. section 360bbb-3(b)(1), unless the authorization is terminated or revoked sooner.     Influenza A by PCR NEGATIVE NEGATIVE Final  Influenza B by PCR NEGATIVE NEGATIVE Final    Comment: (NOTE) The Xpert Xpress SARS-CoV-2/FLU/RSV plus assay is intended as an aid in the diagnosis of influenza from Nasopharyngeal swab specimens and should not be used as a sole basis for treatment. Nasal washings and aspirates are unacceptable for Xpert Xpress SARS-CoV-2/FLU/RSV testing.  Fact Sheet for Patients: BloggerCourse.com  Fact Sheet for Healthcare Providers: SeriousBroker.it  This test is not yet approved or cleared by the [place] FDA and has been authorized for detection and/or diagnosis of SARS-CoV-2 by FDA under an Emergency Use Authorization (EUA). This EUA will remain in effect (meaning this test can be used) for the duration of the COVID-19 declaration under Section 564(b)(1) of the Act, 21 U.S.C. section 360bbb-3(b)(1), unless the authorization is terminated or revoked.     Resp Syncytial Virus by PCR NEGATIVE NEGATIVE Final    Comment: (NOTE) Fact Sheet for Patients: BloggerCourse.com  Fact Sheet for Healthcare Providers: SeriousBroker.it  This test is not yet approved or cleared by the [place] FDA and has been authorized for detection and/or diagnosis of SARS-CoV-2 by FDA under an Emergency Use Authorization (EUA). This EUA will remain in effect (meaning this test can be used) for the duration of the COVID-19 declaration under Section 564(b)(1) of the Act, 21 U.S.C. section 360bbb-3(b)(1), unless the authorization is terminated or revoked.  Performed at [facility], [address] [place], [place] [address]       Radiological Exams on Admission:   Assessment/Plan Principal Problem:   COVID-19 virus infection Active Problems:   UTI (urinary tract infection)   Acute pancreatitis   Essential hypertension   Adult hypothyroidism   Liver cirrhosis secondary to NASH (nonalcoholic steatohepatitis) (HCC)   Chronic diastolic CHF (congestive heart failure) (HCC)   Hypokalemia   Hypophosphatemia   Acute metabolic encephalopathy   Chronic kidney disease, stage 3a (HCC)   Anxiety   Obesity (BMI 35.0-39.9 without comorbidity)   Assessment and Plan:  Principal Problem:   COVID-19 virus infection Active Problems:   UTI (urinary tract infection)   Acute pancreatitis   Essential hypertension   Adult hypothyroidism   Liver cirrhosis secondary to NASH (nonalcoholic steatohepatitis) (HCC)   Chronic diastolic CHF (congestive heart failure) (HCC)   Hypokalemia   Hypophosphatemia   Acute metabolic encephalopathy   Chronic kidney disease, stage 3a (HCC)   Anxiety   Obesity (BMI 35.0-39.9 without comorbidity)      DVT ppx:  SQ Lovenox  Code Status: Full code   Family Communication:  I have tried to call her daughter who did not pick up the phone, I left a message to her.  Disposition Plan:  Anticipate discharge back to previous environment, SNF  Consults called:  none  Admission status and Level of care: Telemetry Medical:   as inpt        Dispo: The patient is from: SNF              Anticipated d/c is to: SNF              Anticipated d/c date is: 2 days              Patient currently is not medically stable to d/c.    Severity of Illness:  The appropriate patient status for this patient is INPATIENT. Inpatient status is judged to be reasonable and necessary in order to provide the required intensity of service to ensure the patient's safety. The patient's presenting symptoms, physical exam findings,  and initial radiographic and laboratory data in the context of their chronic  comorbidities is felt to place them at high risk for further clinical deterioration. Furthermore, it is not anticipated that the patient will be medically stable for discharge from the hospital within 2 midnights of admission.   * I certify that at the point of admission it is my clinical judgment that the patient will require inpatient hospital care spanning beyond 2 midnights from the point of admission due to high intensity of service, high risk for further deterioration and high frequency of surveillance required.*       Date of Service 08/09/2023    Lorretta Harp [facility]   If 7PM-7AM, please contact night-coverage [email or web address] 08/09/2023, 12:05 AM

## 2023-08-08 NOTE — Progress Notes (Signed)
Elink monitoring for the code sepsis protocol.  

## 2023-08-08 NOTE — ED Triage Notes (Signed)
First Nurse Note: Pt via [facility] from [facility]. Pt c/o AMS and UTI type symptoms and vomiting. Pt has been altered since Tuesday but increasingly got worse. Pt is normally independent, pt is disoriented x4 per EMS.  98 CBG  98.6 axillary  80 HR  94% on RA 33 RR  28 ETCO2  181/68 BP

## 2023-08-08 NOTE — ED Provider Triage Note (Signed)
Emergency Medicine Provider Triage Evaluation Note  Anita Bennett , a 83 y.o. female  was evaluated in triage.  Pt complains of AMS, brought in my daughter. Was vomiting earlier today, daughter says similar to when she had a UTI. No fevers.   Review of Systems  Positive: Vomiting Negative: fevers  Physical Exam  BP (!) 177/69 (BP Location: Left Arm)   Pulse 78   Temp 99.3 F (37.4 C) (Oral)   Resp (!) 22   SpO2 92%  Gen:   Awake, confused Resp:  Normal effort  MSK:   Moving all extremities Other:    Medical Decision Making  Medically screening exam initiated at 5:38 PM.  Appropriate orders placed.  Anita Bennett was informed that the remainder of the evaluation will be completed by another provider, this initial triage assessment does not replace that evaluation, and the importance of remaining in the ED until their evaluation is complete.     Jackelyn Hoehn, PA-C 08/08/23 1742

## 2023-08-08 NOTE — Progress Notes (Signed)
Anticoagulation monitoring(Lovenox):  83 yo female ordered Lovenox 40 mg Q24h    Filed Weights   08/08/23 1739  Weight: 90.7 kg (200 lb)   BMI 30.4   Lab Results  Component Value Date   CREATININE 1.10 (H) 08/08/2023   CREATININE 0.95 05/09/2022   CREATININE 0.92 04/30/2021   Estimated Creatinine Clearance: 44.8 mL/min (A) (by C-G formula based on SCr of 1.1 mg/dL (H)). Hemoglobin & Hematocrit     Component Value Date/Time   HGB 10.1 (L) 08/08/2023 1744   HGB 12.4 12/02/2015 1701   HCT 31.7 (L) 08/08/2023 1744   HCT 39.0 12/02/2015 1701     Per Protocol for Patient with estCrcl > 30 ml/min and BMI > 30, will transition to Lovenox 45 mg Q24h.

## 2023-08-08 NOTE — Progress Notes (Signed)
CODE SEPSIS - PHARMACY COMMUNICATION  **Broad Spectrum Antibiotics should be administered within 1 hour of Sepsis diagnosis**  Time Code Sepsis Called/Page Received: 1949  Antibiotics Ordered: Ceftriaxone & Flagyl  Time of 1st antibiotic administration: 2022  Additional action taken by pharmacy: N/A   Anita Bennett 08/08/2023  8:00 PM

## 2023-08-09 DIAGNOSIS — B9620 Unspecified Escherichia coli [E. coli] as the cause of diseases classified elsewhere: Secondary | ICD-10-CM | POA: Diagnosis present

## 2023-08-09 DIAGNOSIS — G9341 Metabolic encephalopathy: Secondary | ICD-10-CM | POA: Diagnosis not present

## 2023-08-09 DIAGNOSIS — R7881 Bacteremia: Secondary | ICD-10-CM | POA: Diagnosis present

## 2023-08-09 DIAGNOSIS — R6520 Severe sepsis without septic shock: Secondary | ICD-10-CM

## 2023-08-09 DIAGNOSIS — I5032 Chronic diastolic (congestive) heart failure: Secondary | ICD-10-CM | POA: Diagnosis present

## 2023-08-09 DIAGNOSIS — A419 Sepsis, unspecified organism: Secondary | ICD-10-CM

## 2023-08-09 LAB — BLOOD CULTURE ID PANEL (REFLEXED) - BCID2

## 2023-08-09 LAB — CBC
HCT: 26.9 % — ABNORMAL LOW (ref 36.0–46.0)
Hemoglobin: 8.4 g/dL — ABNORMAL LOW (ref 12.0–15.0)
MCH: 28.4 pg (ref 26.0–34.0)
MCHC: 31.2 g/dL (ref 30.0–36.0)
MCV: 90.9 fL (ref 80.0–100.0)
Platelets: 93 10*3/uL — ABNORMAL LOW (ref 150–400)
RBC: 2.96 MIL/uL — ABNORMAL LOW (ref 3.87–5.11)
RDW: 14.7 % (ref 11.5–15.5)
WBC: 8 10*3/uL (ref 4.0–10.5)
nRBC: 0 % (ref 0.0–0.2)

## 2023-08-09 LAB — COMPREHENSIVE METABOLIC PANEL
ALT: 16 U/L (ref 0–44)
AST: 36 U/L (ref 15–41)
Albumin: 3 g/dL — ABNORMAL LOW (ref 3.5–5.0)
Alkaline Phosphatase: 35 U/L — ABNORMAL LOW (ref 38–126)
Anion gap: 10 (ref 5–15)
BUN: 17 mg/dL (ref 8–23)
CO2: 23 mmol/L (ref 22–32)
Calcium: 8.1 mg/dL — ABNORMAL LOW (ref 8.9–10.3)
Chloride: 108 mmol/L (ref 98–111)
Creatinine, Ser: 1.1 mg/dL — ABNORMAL HIGH (ref 0.44–1.00)
GFR, Estimated: 50 mL/min — ABNORMAL LOW (ref >60)
Glucose, Bld: 147 mg/dL — ABNORMAL HIGH (ref 70–99)
Potassium: 3.3 mmol/L — ABNORMAL LOW (ref 3.5–5.1)
Sodium: 141 mmol/L (ref 135–145)
Total Bilirubin: 1.9 mg/dL — ABNORMAL HIGH (ref 0.0–1.2)
Total Protein: 5.9 g/dL — ABNORMAL LOW (ref 6.5–8.1)

## 2023-08-09 MED ORDER — LEVOTHYROXINE SODIUM 50 MCG PO TABS
100.0000 ug | ORAL_TABLET | Freq: Every day | ORAL | Status: DC
  Administered 2023-08-09 – 2023-08-15 (×7): 100 ug via ORAL
  Filled 2023-08-09: qty 2
  Filled 2023-08-09: qty 1
  Filled 2023-08-09 (×5): qty 2

## 2023-08-09 MED ORDER — MEMANTINE HCL 5 MG PO TABS
5.0000 mg | ORAL_TABLET | Freq: Two times a day (BID) | ORAL | Status: DC
  Administered 2023-08-09 – 2023-08-15 (×14): 5 mg via ORAL
  Filled 2023-08-09 (×12): qty 1

## 2023-08-09 MED ORDER — LACTULOSE 10 GM/15ML PO SOLN
20.0000 g | ORAL | Status: DC
  Administered 2023-08-09 – 2023-08-13 (×3): 20 g via ORAL
  Filled 2023-08-09 (×3): qty 30

## 2023-08-09 MED ORDER — POLYETHYLENE GLYCOL 3350 17 G PO PACK: 17.0000 g | PACK | Freq: Every day | ORAL | Status: DC | PRN

## 2023-08-09 MED ORDER — NADOLOL 20 MG PO TABS
20.0000 mg | ORAL_TABLET | Freq: Every evening | ORAL | Status: DC
  Administered 2023-08-09 – 2023-08-14 (×7): 20 mg via ORAL
  Filled 2023-08-09 (×8): qty 1

## 2023-08-09 MED ORDER — PAROXETINE HCL 20 MG PO TABS: 20.0000 mg | ORAL_TABLET | Freq: Every day | ORAL | Status: DC

## 2023-08-09 MED ORDER — FUROSEMIDE 10 MG/ML IJ SOLN
20.0000 mg | Freq: Once | INTRAMUSCULAR | Status: AC
  Administered 2023-08-09: 20 mg via INTRAVENOUS
  Filled 2023-08-09: qty 4

## 2023-08-09 MED ORDER — PAROXETINE HCL 20 MG PO TABS
40.0000 mg | ORAL_TABLET | Freq: Every day | ORAL | Status: DC
  Administered 2023-08-09 – 2023-08-15 (×7): 40 mg via ORAL
  Filled 2023-08-09 (×8): qty 2

## 2023-08-09 MED ORDER — POTASSIUM CHLORIDE CRYS ER 20 MEQ PO TBCR
40.0000 meq | EXTENDED_RELEASE_TABLET | Freq: Once | ORAL | Status: AC
  Administered 2023-08-09: 40 meq via ORAL

## 2023-08-09 MED ORDER — LISINOPRIL 10 MG PO TABS: 5.0000 mg | ORAL_TABLET | Freq: Every day | ORAL | Status: DC

## 2023-08-09 MED ORDER — ASPIRIN 81 MG PO TBEC
81.0000 mg | DELAYED_RELEASE_TABLET | Freq: Every day | ORAL | Status: DC
  Administered 2023-08-09 – 2023-08-15 (×7): 81 mg via ORAL
  Filled 2023-08-09 (×7): qty 1

## 2023-08-09 MED ORDER — QUETIAPINE FUMARATE 25 MG PO TABS
25.0000 mg | ORAL_TABLET | Freq: Every day | ORAL | Status: DC
  Administered 2023-08-09 – 2023-08-14 (×7): 25 mg via ORAL
  Filled 2023-08-09 (×7): qty 1

## 2023-08-09 MED ORDER — ALPRAZOLAM 0.5 MG PO TABS
0.5000 mg | ORAL_TABLET | Freq: Every day | ORAL | Status: DC | PRN
  Administered 2023-08-09: 0.5 mg via ORAL
  Filled 2023-08-09: qty 1

## 2023-08-09 MED ORDER — MIRABEGRON ER 50 MG PO TB24: 50.0000 mg | ORAL_TABLET | Freq: Every day | ORAL | Status: DC

## 2023-08-09 MED ORDER — SODIUM CHLORIDE 0.9 % IV SOLN: INTRAVENOUS | Status: DC

## 2023-08-09 MED ORDER — METHYLPREDNISOLONE SODIUM SUCC 40 MG IJ SOLR
40.0000 mg | Freq: Two times a day (BID) | INTRAMUSCULAR | Status: DC
  Administered 2023-08-09 (×2): 40 mg via INTRAVENOUS
  Filled 2023-08-09 (×2): qty 1

## 2023-08-09 MED ORDER — OSMOLITE 1.2 CAL PO LIQD: 1000.0000 mL | ORAL | Status: DC

## 2023-08-09 MED ORDER — MAGNESIUM SULFATE 2 GM/50ML IV SOLN
2.0000 g | Freq: Once | INTRAVENOUS | Status: AC
  Administered 2023-08-09: 2 g via INTRAVENOUS
  Filled 2023-08-09: qty 50

## 2023-08-09 MED ORDER — METOPROLOL SUCCINATE ER 50 MG PO TB24: 25.0000 mg | ORAL_TABLET | Freq: Every day | ORAL | Status: DC

## 2023-08-09 NOTE — Progress Notes (Addendum)
Progress Note    Anita Bennett  YQM:[id number] DOB: 1940/10/14  DOA: 08/08/2023 PCP: Wilford Corner, PA-C      Brief Narrative:    Medical records reviewed and are as summarized below:  Anita Bennett is a 83 y.o. female with medical history significant for hypertension, hyperlipidemia, chronic HFpEF, hypothyroidism, dementia, depression, anxiety, CKD stage IIIa, liver cirrhosis secondary to NAFLD, kidney stones, obesity.  She presented from SNF to the hospital because of altered mental status.  There was also report of nausea and vomiting.  She was seen by her PCP the day prior to admission and was found to have UTI.  She had been prescribed antibiotics but she had not started it yet.  She was hypoxic with oxygen saturation of 86% on room air and she tested positive for COVID-19 infection.  Lactic acid was elevated at 3.7 on admission.  BNP was elevated at 1,199.6.   Assessment/Plan:   Principal Problem:   Severe sepsis (HCC) Active Problems:   E coli bacteremia   Acute pancreatitis   UTI (urinary tract infection)   Essential hypertension   Adult hypothyroidism   Liver cirrhosis secondary to NASH (nonalcoholic steatohepatitis) (HCC)   Chronic diastolic CHF (congestive heart failure) (HCC)   Hypokalemia   Hypophosphatemia   Acute metabolic encephalopathy   Chronic kidney disease, stage 3a (HCC)   COVID-19 virus infection   Anxiety   Obesity (BMI 35.0-39.9 without comorbidity)    Body mass index is 30.41 kg/m.  (Obesity)   Severe sepsis secondary to acute UTI and E. coli bacteremia: Blood culture showed E. coli.  Continue IV ceftriaxone.  Follow-up blood culture sensitivity report. BP stable   Acute metabolic encephalopathy on underlying dementia: Improving. Daughter said patient was lethargic and could not even speak before admission.   Acute hypoxemic respiratory failure: Improved.  She is tolerating room air.  Discontinue IV  steroids.   Nausea and vomiting: Improved.  Antiemetics as needed. Suspected mild acute pancreatitis on CT abdomen: No abdominal pain.  Lipase was only 87.  Significance of this finding on CT is unclear.   COVID-19 infection: Supportive care as needed. [name], patient's daughter, said patient had COVID-19 infection about 2 weeks prior to admission.   Hypokalemia: Continue potassium repletion.  She was given IV magnesium sulfate for low normal magnesium. Hypophosphatemia: She was given IV potassium phosphate.  Monitor electrolytes.   Acute thrombocytopenia: May be related to sepsis.  Monitor CBC.   Probable acute on chronic HFpEF: She had IV Lasix on admission.  Hold oral Lasix for now.   Liver cirrhosis: Compensated.  Continue lactulose and nadolol   Comorbidities include hypertension, hypothyroidism, CKD stage IIIa, depression, anxiety    Diet Order             Diet NPO time specified Except for: Sips with Meds, Ice Chips  Diet effective now                            Consultants: None  Procedures: None    Medications:    aspirin EC  81 mg Oral Daily   enoxaparin (LOVENOX) injection  0.5 mg/kg Subcutaneous Q24H   lactulose  20 g Oral QODAY   levothyroxine  100 mcg Oral Q0600   [START ON 08/10/2023] lisinopril  5 mg Oral Daily   memantine  5 mg Oral BID   nadolol  20 mg Oral QPM   PARoxetine  40 mg Oral Daily   QUEtiapine  25 mg Oral QHS   Continuous Infusions:  cefTRIAXone (ROCEPHIN)  IV       Anti-infectives (From admission, onward)    Start     Dose/Rate Route Frequency Ordered Stop   08/09/23 2000  cefTRIAXone (ROCEPHIN) 2 g in sodium chloride 0.9 % 100 mL IVPB        2 g 200 mL/hr over 30 Minutes Intravenous Every 24 hours 08/08/23 2233     08/08/23 2000  cefTRIAXone (ROCEPHIN) 2 g in sodium chloride 0.9 % 100 mL IVPB        2 g 200 mL/hr over 30 Minutes Intravenous Once 08/08/23 1952 08/08/23 2109   08/08/23 2000  metroNIDAZOLE  (FLAGYL) IVPB 500 mg        500 mg 100 mL/hr over 60 Minutes Intravenous  Once 08/08/23 1956 08/08/23 2152              Family Communication/Anticipated D/C date and plan/Code Status   DVT prophylaxis:      Code Status: Full Code  Family Communication: Plan discussed with [name], daughter, over the phone.  I was unable to reach [place] by phone. Disposition Plan: Plan to discharge to SNF   Status is: Inpatient Remains inpatient appropriate because: Sepsis and bacteremia       Subjective:   Interval events noted.  She has no complaints.  She feels better.  Of note, she is confused and cannot provide an adequate history.  Objective:    Vitals:   08/09/23 0557 08/09/23 0947 08/09/23 1215 08/09/23 1300  BP:  (!) 110/55 (!) 116/47 (!) 118/50  Pulse:  63 60 62  Resp:  16 (!) 5 18  Temp: 98.1 F (36.7 C) 98 F (36.7 C)  98.2 F (36.8 C)  TempSrc: Oral Oral  Oral  SpO2:  100% 100% 98%  Weight:      Height:       No data found.  No intake or output data in the 24 hours ending 08/09/23 1334 Filed Weights   08/08/23 1739  Weight: 90.7 kg    Exam:  GEN: NAD SKIN: Warm and dry EYES: No pallor or icterus ENT: MMM CV: RRR PULM: CTA B ABD: soft, ND, NT, +BS CNS: AAO x 1, non focal EXT: No edema or tenderness        Data Reviewed:   I have personally reviewed following labs and imaging studies:  Labs: Labs show the following:   Basic Metabolic Panel: Recent Labs  Lab 08/08/23 1744 08/09/23 0554  NA 139 141  K 3.2* 3.3*  CL 103 108  CO2 19* 23  GLUCOSE 111* 147*  BUN 14 17  CREATININE 1.10* 1.10*  CALCIUM 8.7* 8.1*  MG 1.8  --   PHOS 1.3*  --    GFR Estimated Creatinine Clearance: 44.8 mL/min (A) (by C-G formula based on SCr of 1.1 mg/dL (H)). Liver Function Tests: Recent Labs  Lab 08/08/23 1744 08/09/23 0554  AST 40 36  ALT 18 16  ALKPHOS 78 35*  BILITOT 3.1* 1.9*  PROT 7.1 5.9*  ALBUMIN 3.8 3.0*   Recent Labs  Lab  08/08/23 1744  LIPASE 87*   Recent Labs  Lab 08/08/23 2234  AMMONIA 26   Coagulation profile No results for input(s): "INR", "PROTIME" in the last 168 hours.  CBC: Recent Labs  Lab 08/08/23 1744 08/09/23 0554  WBC 5.8 8.0  NEUTROABS 5.3  --   HGB 10.1* 8.4*  HCT 31.7* 26.9*  MCV 87.8 90.9  PLT 137* 93*   Cardiac Enzymes: No results for input(s): "CKTOTAL", "CKMB", "CKMBINDEX", "TROPONINI" in the last 168 hours. BNP (last 3 results) No results for input(s): "PROBNP" in the last 8760 hours. CBG: No results for input(s): "GLUCAP" in the last 168 hours. D-Dimer: No results for input(s): "DDIMER" in the last 72 hours. Hgb A1c: No results for input(s): "HGBA1C" in the last 72 hours. Lipid Profile: Recent Labs    08/08/23 1744  TRIG 90   Thyroid function studies: No results for input(s): "TSH", "T4TOTAL", "T3FREE", "THYROIDAB" in the last 72 hours.  Invalid input(s): "FREET3" Anemia work up: No results for input(s): "VITAMINB12", "FOLATE", "FERRITIN", "TIBC", "IRON", "RETICCTPCT" in the last 72 hours. Sepsis Labs: Recent Labs  Lab 08/08/23 1744 08/08/23 2025 08/09/23 0554  WBC 5.8  --  8.0  LATICACIDVEN 3.7* 3.0*  --     Microbiology Recent Results (from the past 240 hours)  Resp panel by RT-PCR (RSV, Flu A&B, Covid) Anterior Nasal Swab     Status: Abnormal   Collection Time: 08/08/23  5:44 PM   Specimen: Anterior Nasal Swab  Result Value Ref Range Status   SARS Coronavirus 2 by RT PCR POSITIVE (A) NEGATIVE Final    Comment: (NOTE) SARS-CoV-2 target nucleic acids are DETECTED.  The SARS-CoV-2 RNA is generally detectable in upper respiratory specimens during the acute phase of infection. Positive results are indicative of the presence of the identified virus, but do not rule out bacterial infection or co-infection with other pathogens not detected by the test. Clinical correlation with patient history and other diagnostic information is necessary to  determine patient infection status. The expected result is Negative.  Fact Sheet for Patients: BloggerCourse.com  Fact Sheet for Healthcare Providers: SeriousBroker.it  This test is not yet approved or cleared by the [place] FDA and  has been authorized for detection and/or diagnosis of SARS-CoV-2 by FDA under an Emergency Use Authorization (EUA).  This EUA will remain in effect (meaning this test can be used) for the duration of  the COVID-19 declaration under Section 564(b)(1) of the A ct, 21 U.S.C. section 360bbb-3(b)(1), unless the authorization is terminated or revoked sooner.     Influenza A by PCR NEGATIVE NEGATIVE Final   Influenza B by PCR NEGATIVE NEGATIVE Final    Comment: (NOTE) The Xpert Xpress SARS-CoV-2/FLU/RSV plus assay is intended as an aid in the diagnosis of influenza from Nasopharyngeal swab specimens and should not be used as a sole basis for treatment. Nasal washings and aspirates are unacceptable for Xpert Xpress SARS-CoV-2/FLU/RSV testing.  Fact Sheet for Patients: BloggerCourse.com  Fact Sheet for Healthcare Providers: SeriousBroker.it  This test is not yet approved or cleared by the [place] FDA and has been authorized for detection and/or diagnosis of SARS-CoV-2 by FDA under an Emergency Use Authorization (EUA). This EUA will remain in effect (meaning this test can be used) for the duration of the COVID-19 declaration under Section 564(b)(1) of the Act, 21 U.S.C. section 360bbb-3(b)(1), unless the authorization is terminated or revoked.     Resp Syncytial Virus by PCR NEGATIVE NEGATIVE Final    Comment: (NOTE) Fact Sheet for Patients: BloggerCourse.com  Fact Sheet for Healthcare Providers: SeriousBroker.it  This test is not yet approved or cleared by the [place] FDA and has  been authorized for detection and/or diagnosis of SARS-CoV-2 by FDA under an Emergency Use Authorization (EUA). This EUA will remain in effect (meaning this test can be  used) for the duration of the COVID-19 declaration under Section 564(b)(1) of the Act, 21 U.S.C. section 360bbb-3(b)(1), unless the authorization is terminated or revoked.  Performed at [facility], [address] [place], [place] [address]   Culture, blood (routine x 2)     Status: None (Preliminary result)   Collection Time: 08/08/23  6:03 PM   Specimen: BLOOD  Result Value Ref Range Status   Specimen Description BLOOD BLOOD RIGHT FOREARM  Final   Special Requests   Final    BOTTLES DRAWN AEROBIC AND ANAEROBIC Blood Culture results may not be optimal due to an inadequate volume of blood received in culture bottles   Culture  Setup Time   Final    Organism ID to follow GRAM NEGATIVE RODS IN BOTH AEROBIC AND ANAEROBIC BOTTLES CRITICAL RESULT CALLED TO, READ BACK BY AND VERIFIED WITH: RAQUEL RODRIGUEZ GUZMAN AT 0710 08/09/23.PMF Performed at [facility], [address] [place], [place] [address]    Culture GRAM NEGATIVE RODS  Final   Report Status PENDING  Incomplete  Blood Culture ID Panel (Reflexed)     Status: Abnormal   Collection Time: 08/08/23  6:03 PM  Result Value Ref Range Status   Enterococcus faecalis NOT DETECTED NOT DETECTED Final   Enterococcus Faecium NOT DETECTED NOT DETECTED Final   Listeria monocytogenes NOT DETECTED NOT DETECTED Final   Staphylococcus species NOT DETECTED NOT DETECTED Final   Staphylococcus aureus (BCID) NOT DETECTED NOT DETECTED Final   Staphylococcus epidermidis NOT DETECTED NOT DETECTED Final   Staphylococcus lugdunensis NOT DETECTED NOT DETECTED Final   Streptococcus species NOT DETECTED NOT DETECTED Final   Streptococcus agalactiae NOT DETECTED NOT DETECTED Final   Streptococcus pneumoniae NOT DETECTED NOT DETECTED Final   Streptococcus pyogenes NOT  DETECTED NOT DETECTED Final   A.calcoaceticus-baumannii NOT DETECTED NOT DETECTED Final   Bacteroides fragilis NOT DETECTED NOT DETECTED Final   Enterobacterales DETECTED (A) NOT DETECTED Final    Comment: Enterobacterales represent a large order of gram negative bacteria, not a single organism. CRITICAL RESULT CALLED TO, READ BACK BY AND VERIFIED WITH: RAQUEL RODRIGUEZ GUZMAN AT 0710 08/09/23.PMF    Enterobacter cloacae complex NOT DETECTED NOT DETECTED Final   Escherichia coli DETECTED (A) NOT DETECTED Final    Comment: CRITICAL RESULT CALLED TO, READ BACK BY AND VERIFIED WITH: RAQUEL RODRIGUEZ GUZMAN AT 0710 08/09/23.PMF    Klebsiella aerogenes NOT DETECTED NOT DETECTED Final   Klebsiella oxytoca NOT DETECTED NOT DETECTED Final   Klebsiella pneumoniae NOT DETECTED NOT DETECTED Final   Proteus species NOT DETECTED NOT DETECTED Final   Salmonella species NOT DETECTED NOT DETECTED Final   Serratia marcescens NOT DETECTED NOT DETECTED Final   Haemophilus influenzae NOT DETECTED NOT DETECTED Final   Neisseria meningitidis NOT DETECTED NOT DETECTED Final   Pseudomonas aeruginosa NOT DETECTED NOT DETECTED Final   Stenotrophomonas maltophilia NOT DETECTED NOT DETECTED Final   Candida albicans NOT DETECTED NOT DETECTED Final   Candida auris NOT DETECTED NOT DETECTED Final   Candida glabrata NOT DETECTED NOT DETECTED Final   Candida krusei NOT DETECTED NOT DETECTED Final   Candida parapsilosis NOT DETECTED NOT DETECTED Final   Candida tropicalis NOT DETECTED NOT DETECTED Final   Cryptococcus neoformans/gattii NOT DETECTED NOT DETECTED Final   CTX-M ESBL NOT DETECTED NOT DETECTED Final   Carbapenem resistance IMP NOT DETECTED NOT DETECTED Final   Carbapenem resistance KPC NOT DETECTED NOT DETECTED Final   Carbapenem resistance NDM NOT DETECTED NOT DETECTED  Final   Carbapenem resist OXA 48 LIKE NOT DETECTED NOT DETECTED Final   Carbapenem resistance VIM NOT DETECTED NOT DETECTED Final     Comment: Performed at [facility], [address] [place], [place] [address]  Culture, blood (routine x 2)     Status: None (Preliminary result)   Collection Time: 08/08/23  8:25 PM   Specimen: BLOOD  Result Value Ref Range Status   Specimen Description BLOOD BLOOD LEFT HAND  Final   Special Requests   Final    BOTTLES DRAWN AEROBIC AND ANAEROBIC Blood Culture adequate volume   Culture  Setup Time   Final    GRAM NEGATIVE RODS ANAEROBIC BOTTLE ONLY CRITICAL RESULT CALLED TO, READ BACK BY AND VERIFIED WITH: RAQUEL RODRIGUEZ GUZMAN AT 0710 08/09/23.PMF Performed at [facility], [address] [place], [place] [address]    Culture GRAM NEGATIVE RODS  Final   Report Status PENDING  Incomplete    Procedures and diagnostic studies:  CT ABDOMEN PELVIS W CONTRAST Result Date: 08/08/2023 CLINICAL DATA:  Altered mental status, UTI symptoms, vomiting EXAM: CT ABDOMEN AND PELVIS WITH CONTRAST TECHNIQUE: Multidetector CT imaging of the abdomen and pelvis was performed using the standard protocol following bolus administration of intravenous contrast. RADIATION DOSE REDUCTION: This exam was performed according to the departmental dose-optimization program which includes automated exposure control, adjustment of the mA and/or kV according to patient size and/or use of iterative reconstruction technique. CONTRAST:  OMNIPAQUE IOHEXOL 300 MG/ML  SOLN COMPARISON:  None Available. FINDINGS: Lower chest: Trace bilateral pleural effusions. Mild bilateral lower lobe atelectasis. Hepatobiliary: Nodular hepatic contour, suggesting cirrhosis. No focal hepatic lesion is seen. Status post cholecystectomy. No intrahepatic or extrahepatic ductal dilatation. Pancreas: Mild peripancreatic stranding, including along the pancreatic tail (series 2/image 34), raising the possibility of mild acute pancreatitis. No pancreatic mass, atrophy, or drainable fluid collection/pseudocyst. Spleen: Mildly  enlarged, measuring 15.2 cm in maximal craniocaudal dimension. Adrenals/Urinary Tract: Adrenal glands are within normal limits. Kidneys are within normal limits. Mild fullness of the bilateral renal collecting system without frank hydronephrosis. Thick-walled bladder, although underdistended. Stomach/Bowel: Stomach is notable for a small hiatal hernia. No evidence of bowel obstruction. Normal appendix (series 2/image 88). No colonic wall thickening or inflammatory changes. Moderate rectal stool burden, suggesting mild fecal impaction, without associated wall thickening to suggest stercoral colitis. Vascular/Lymphatic: No evidence of abdominal aortic aneurysm. Atherosclerotic calcifications of the abdominal aorta and branch vessels, although vessels remain patent. No suspicious abdominopelvic lymphadenopathy. Reproductive: Status post hysterectomy. No adnexal masses. Other: No abdominopelvic ascites. Musculoskeletal: Degenerative changes of the visualized thoracolumbar spine. Moderate superior endplate compression fracture deformity at L2, likely chronic. IMPRESSION: Suspected mild acute pancreatitis. Thick-walled bladder, although underdistended. Correlate for cystitis. Cirrhosis with mild splenomegaly. Suspected mild rectal fecal impaction, without stercoral colitis. Trace bilateral pleural effusions. Electronically Signed   By: Charline Bills M.D.   On: 08/08/2023 21:39   CT Head Wo Contrast Result Date: 08/08/2023 CLINICAL DATA:  Altered mental status. EXAM: CT HEAD WITHOUT CONTRAST TECHNIQUE: Contiguous axial images were obtained from the base of the skull through the vertex without intravenous contrast. RADIATION DOSE REDUCTION: This exam was performed according to the departmental dose-optimization program which includes automated exposure control, adjustment of the mA and/or kV according to patient size and/or use of iterative reconstruction technique. COMPARISON:  None Available. FINDINGS: Brain: There  is mild cerebral atrophy with widening of the extra-axial spaces and ventricular dilatation. There are areas of decreased attenuation within the  white matter tracts of the supratentorial brain, consistent with microvascular disease changes. Vascular: Moderate to marked severity bilateral cavernous carotid artery calcification is noted. Skull: Normal. Negative for fracture or focal lesion. Sinuses/Orbits: No acute finding. Other: None. IMPRESSION: 1. No acute intracranial abnormality. 2. Generalized cerebral atrophy with widening of the extra-axial spaces and ventricular dilatation. Electronically Signed   By: Aram Candela M.D.   On: 08/08/2023 19:07   DG Chest 1 View Result Date: 08/08/2023 CLINICAL DATA:  Altered mental status. EXAM: CHEST  1 VIEW COMPARISON:  April 30, 2021 FINDINGS: The cardiac silhouette is mildly enlarged and unchanged in size. Mild, diffuse, chronic appearing increased interstitial lung markings are seen. Mild, stable areas of linear scarring and/or atelectasis are seen within the mid right lung and left lung base. No pleural effusion or pneumothorax is identified. Multilevel degenerative changes are seen throughout the thoracic spine. IMPRESSION: Chronic appearing increased interstitial lung markings with mild, stable areas of linear scarring and/or atelectasis within the mid right lung and left lung base. Electronically Signed   By: Aram Candela M.D.   On: 08/08/2023 19:06               LOS: 1 day   Cleland Simkins  [facility]   Pager on [email or web address]. If 7PM-7AM, please contact night-coverage at [email or web address]     08/09/2023, 1:34 PM

## 2023-08-09 NOTE — Progress Notes (Signed)
PHARMACY - PHYSICIAN COMMUNICATION CRITICAL VALUE ALERT - BLOOD CULTURE IDENTIFICATION (BCID)  Anita Bennett is an 83 y.o. female who presented to [facility] on 08/08/2023 with a chief complaint of AMS  Assessment:  Bcx: 2 of 4 (diff sets) bottles (both anaerobic) with E.coli, no resistance detected.   Covid+    PER H&P:  , UA in clinic: cloudy appearance, 3+ leukocyte, bacteria> 50 and WBC > 50   Name of physician (or Provider) Contacted: B Ayiku  Current antibiotics: Ceftriaxone 2gm IV q24h  Changes to prescribed antibiotics recommended:  Patient is on recommended antibiotics - No changes needed  Results for orders placed or performed during the hospital encounter of 08/08/23  Blood Culture ID Panel (Reflexed) (Collected: 08/08/2023  6:03 PM)  Result Value Ref Range   Enterococcus faecalis NOT DETECTED NOT DETECTED   Enterococcus Faecium NOT DETECTED NOT DETECTED   Listeria monocytogenes NOT DETECTED NOT DETECTED   Staphylococcus species NOT DETECTED NOT DETECTED   Staphylococcus aureus (BCID) NOT DETECTED NOT DETECTED   Staphylococcus epidermidis NOT DETECTED NOT DETECTED   Staphylococcus lugdunensis NOT DETECTED NOT DETECTED   Streptococcus species NOT DETECTED NOT DETECTED   Streptococcus agalactiae NOT DETECTED NOT DETECTED   Streptococcus pneumoniae NOT DETECTED NOT DETECTED   Streptococcus pyogenes NOT DETECTED NOT DETECTED   A.calcoaceticus-baumannii NOT DETECTED NOT DETECTED   Bacteroides fragilis NOT DETECTED NOT DETECTED   Enterobacterales DETECTED (A) NOT DETECTED   Enterobacter cloacae complex NOT DETECTED NOT DETECTED   Escherichia coli DETECTED (A) NOT DETECTED   Klebsiella aerogenes NOT DETECTED NOT DETECTED   Klebsiella oxytoca NOT DETECTED NOT DETECTED   Klebsiella pneumoniae NOT DETECTED NOT DETECTED   Proteus species NOT DETECTED NOT DETECTED   Salmonella species NOT DETECTED NOT DETECTED   Serratia marcescens NOT DETECTED NOT DETECTED   Haemophilus  influenzae NOT DETECTED NOT DETECTED   Neisseria meningitidis NOT DETECTED NOT DETECTED   Pseudomonas aeruginosa NOT DETECTED NOT DETECTED   Stenotrophomonas maltophilia NOT DETECTED NOT DETECTED   Candida albicans NOT DETECTED NOT DETECTED   Candida auris NOT DETECTED NOT DETECTED   Candida glabrata NOT DETECTED NOT DETECTED   Candida krusei NOT DETECTED NOT DETECTED   Candida parapsilosis NOT DETECTED NOT DETECTED   Candida tropicalis NOT DETECTED NOT DETECTED   Cryptococcus neoformans/gattii NOT DETECTED NOT DETECTED   CTX-M ESBL NOT DETECTED NOT DETECTED   Carbapenem resistance IMP NOT DETECTED NOT DETECTED   Carbapenem resistance KPC NOT DETECTED NOT DETECTED   Carbapenem resistance NDM NOT DETECTED NOT DETECTED   Carbapenem resist OXA 48 LIKE NOT DETECTED NOT DETECTED   Carbapenem resistance VIM NOT DETECTED NOT DETECTED    Ruth Tully A 08/09/2023  8:45 AM

## 2023-08-09 NOTE — Progress Notes (Signed)
PHARMACY CONSULT NOTE - ELECTROLYTES  Pharmacy Consult for Electrolyte Monitoring and Replacement   Recent Labs: Height: 5\' 8"  (172.7 cm) Weight: 90.7 kg (200 lb) IBW/kg (Calculated) : 63.9 Estimated Creatinine Clearance: 44.8 mL/min (A) (by C-G formula based on SCr of 1.1 mg/dL (H)). Potassium (mmol/L)  Date Value  08/09/2023 3.3 (L)  08/29/2011 4.8   Magnesium (mg/dL)  Date Value  81/19/1478 1.8   Calcium  Date Value  08/09/2023 8.1 mg/dL (L)  29/56/2130 8.9   Albumin  Date Value  08/09/2023 3.0 g/dL (L)  86/57/8469 3.8   Phosphorus (mg/dL)  Date Value  62/95/2841 1.3 (L)   Sodium  Date Value  08/09/2023 141 mmol/L  11/29/2017 138  08/29/2011 139 mmol/L   Corrected Ca: 8.8 mg/dL  Assessment  Anita Bennett is a 83 y.o. female presenting with AMS, bacteremia. PMH significant for HTN, HLD, dCHF, hypothyroidism, dementia, depression with anxiety, CKD-3A, liver cirrhosis secondary to NAFLD, kidney stone, obesity . Pharmacy has been consulted to monitor and replace electrolytes.  Diet: NPO, sips w/ meds MIVF: none Pertinent medications: lasix x1 @ 0031 1/17, KCL 40 meq PO x 1 @2323  1/16,  KPHOS 15 mmol IV x 1 @0030  1/17  Goal of Therapy: Electrolytes WNL  Plan:  K 3.3   Will order KCL 40 meq PO x 1 Mag 1.8  (1/16@1744 -no Mag given per [facility])   Will order Magnesium sulfate 2 gm IV x1 Since KPhos drip just finished @ 0636 will not add Phos to lab drawn @ 0554 at this time Check BMP, Mg, Phos with AM labs  Thank you for allowing pharmacy to be a part of this patient's care.  Angelique Blonder, PharmD Clinical Pharmacist 08/09/2023 7:36 AM

## 2023-08-10 DIAGNOSIS — A419 Sepsis, unspecified organism: Secondary | ICD-10-CM

## 2023-08-10 DIAGNOSIS — R6520 Severe sepsis without septic shock: Secondary | ICD-10-CM

## 2023-08-10 LAB — CBC WITH DIFFERENTIAL/PLATELET
Abs Immature Granulocytes: 0.06 10*3/uL (ref 0.00–0.07)
Basophils Absolute: 0 10*3/uL (ref 0.0–0.1)
Basophils Relative: 0 %
Eosinophils Absolute: 0 10*3/uL (ref 0.0–0.5)
Eosinophils Relative: 0 %
HCT: 27.2 % — ABNORMAL LOW (ref 36.0–46.0)
Hemoglobin: 8.4 g/dL — ABNORMAL LOW (ref 12.0–15.0)
Immature Granulocytes: 1 %
Lymphocytes Relative: 13 %
Lymphs Abs: 0.9 10*3/uL (ref 0.7–4.0)
MCH: 28.1 pg (ref 26.0–34.0)
MCHC: 30.9 g/dL (ref 30.0–36.0)
MCV: 91 fL (ref 80.0–100.0)
Monocytes Absolute: 0.6 10*3/uL (ref 0.1–1.0)
Monocytes Relative: 8 %
Neutro Abs: 5.5 10*3/uL (ref 1.7–7.7)
Neutrophils Relative %: 78 %
Platelets: 105 10*3/uL — ABNORMAL LOW (ref 150–400)
RBC: 2.99 MIL/uL — ABNORMAL LOW (ref 3.87–5.11)
RDW: 14.6 % (ref 11.5–15.5)
WBC: 7 10*3/uL (ref 4.0–10.5)
nRBC: 0 % (ref 0.0–0.2)

## 2023-08-10 LAB — BASIC METABOLIC PANEL
Anion gap: 8 (ref 5–15)
BUN: 35 mg/dL — ABNORMAL HIGH (ref 8–23)
CO2: 25 mmol/L (ref 22–32)
Calcium: 8.2 mg/dL — ABNORMAL LOW (ref 8.9–10.3)
Chloride: 105 mmol/L (ref 98–111)
Creatinine, Ser: 1.22 mg/dL — ABNORMAL HIGH (ref 0.44–1.00)
GFR, Estimated: 44 mL/min — ABNORMAL LOW (ref >60)
Glucose, Bld: 99 mg/dL (ref 70–99)
Potassium: 3.3 mmol/L — ABNORMAL LOW (ref 3.5–5.1)
Sodium: 138 mmol/L (ref 135–145)

## 2023-08-10 LAB — PHOSPHORUS: Phosphorus: 3.4 mg/dL (ref 2.5–4.6)

## 2023-08-10 LAB — CBG MONITORING, ED
Glucose-Capillary: 98 mg/dL (ref 70–99)
Glucose-Capillary: 88 mg/dL (ref 70–99)
Glucose-Capillary: 78 mg/dL (ref 70–99)

## 2023-08-10 LAB — MAGNESIUM: Magnesium: 2.6 mg/dL — ABNORMAL HIGH (ref 1.7–2.4)

## 2023-08-10 LAB — GLUCOSE, CAPILLARY: Glucose-Capillary: 78 mg/dL (ref 70–99)

## 2023-08-10 MED ORDER — POTASSIUM CHLORIDE CRYS ER 20 MEQ PO TBCR
40.0000 meq | EXTENDED_RELEASE_TABLET | Freq: Once | ORAL | Status: AC
  Administered 2023-08-10: 40 meq via ORAL
  Filled 2023-08-10: qty 2

## 2023-08-10 MED ORDER — ORAL CARE MOUTH RINSE: 15.0000 mL | OROMUCOSAL | Status: DC | PRN

## 2023-08-10 MED ORDER — ENSURE ENLIVE PO LIQD
237.0000 mL | Freq: Two times a day (BID) | ORAL | Status: DC
  Administered 2023-08-11 – 2023-08-15 (×9): 237 mL via ORAL

## 2023-08-10 NOTE — Evaluation (Signed)
Occupational Therapy Evaluation Patient Details Name: Anita Bennett MRN: [id number] DOB: [birth year] Today's Date: 08/10/2023   History of Present Illness Anita Bennett is a 83 y.o. female past medical history significant for dementia, Anita Bennett, who presents to the emergency department for altered mental status.  Patient lives at nursing facility but normally takes care of herself on her own.  Patient is here with her daughter who is at bedside.  Yesterday was evaluated at her primary care physician office and we will called in antibiotics for possible urinary tract infection.  Today she had significantly worsening altered mental status with not opening her eyes or responding.  Episode of nausea and vomiting.  Decreased p.o. intake today.  No known significant falls or head trauma. + Covid 19.   Clinical Impression   Pt in bed upon OT arrival and daughter present for duration of session.  Daughter able to assist with pt's hx/PLOF.  Pt pleasant and cooperative.  Bed mobility slow and effortful, but pt with good participation in each step to reach EOB, requiring extra time.  Initial sit to stand transfer with mod A, but once up pt reported feeling good with standing.  Pt tolerated step pivot to bedside recliner with min A, and a second transfer sit to stand from recliner with min A to allow OT to place linens on chair.  Discussed AE needs with daughter; pt resides at [facility] with handicapped accessible bathroom; no AE needs identified for OT.  Pt and daughter confirm pt was able to manage basic self care without physical assist pta.  Pt currently requiring min A and extra time for transfers, and anticipate at least min-mod A for LB ADLs.  Daughter in agreement with recommendation for [facility] OT at [facility] upon d/c in order to maximize indep with ADLs and functional mobility.  Pt will benefit from continued OT in the acute setting for same.  Left pt up in recliner with food tray and daughter present.  Daughter  instructed to use call light for assist to transfer pt back to bed when ready.  Daughter agreed.      If plan is discharge home, recommend the following: A little help with walking and/or transfers;Assistance with cooking/housework;Supervision due to cognitive status;Assist for transportation;A lot of help with bathing/dressing/bathroom    Functional Status Assessment  Patient has had a recent decline in their functional status and demonstrates the ability to make significant improvements in function in a reasonable and predictable amount of time.  Equipment Recommendations  None recommended by OT    Recommendations for Other Services       Precautions / Restrictions Precautions Precautions: Fall Restrictions Weight Bearing Restrictions Per Provider Order: No      Mobility Bed Mobility Overal bed mobility: Needs Assistance Bed Mobility: Supine to Sit     Supine to sit: HOB elevated, Min assist     General bed mobility comments: increased time to slide legs to EOB, min A to assist trunk Patient Response: Cooperative  Transfers Overall transfer level: Needs assistance Equipment used: Rolling walker (2 wheels) Transfers: Sit to/from Stand, Bed to chair/wheelchair/BSC Sit to Stand: From elevated surface, Mod assist     Step pivot transfers: Min assist     General transfer comment: Initial sit to stand transfer from elevated bed mod A; 2nd sit to stand from recliner with min A.  Pt initially stiff and improved with standing at bedside.      Balance Overall balance assessment: Needs assistance Sitting-balance support:  No upper extremity supported, Feet supported Sitting balance-Leahy Scale: Fair     Standing balance support: Bilateral upper extremity supported, During functional activity, Reliant on assistive device for balance Standing balance-Leahy Scale: Fair                             ADL either performed or assessed with clinical judgement   ADL  Overall ADL's : Needs assistance/impaired Eating/Feeding: Set up;Sitting                                   Functional mobility during ADLs: Minimal assistance;Rolling walker (2 wheels) General ADL Comments: Anticipate min-mod A for LB ADLs d/t soreness in LEs and requiring min A in standing.     Vision Patient Visual Report: No change from baseline                         Pertinent Vitals/Pain Pain Assessment Pain Assessment: Faces Pain Score: 2  Pain Location: "a little soreness in my legs." Pain Descriptors / Indicators: Sore Pain Intervention(s): Monitored during session, Repositioned     Extremity/Trunk Assessment Upper Extremity Assessment Upper Extremity Assessment: Generalized weakness   Lower Extremity Assessment Lower Extremity Assessment: Generalized weakness       Communication Communication Communication: Hearing impairment Cueing Techniques: Verbal cues   Cognition Arousal: Alert Behavior During Therapy: WFL for tasks assessed/performed Overall Cognitive Status: Impaired/Different from baseline                                 General Comments: Not oriented to year; hx of dementia; follows 1 steps commands without difficulty as long as speaker is loud (pt is HOH and not wearing hearing aids)           Exercises Other Exercises Other Exercises: Educ on OT role, goals, poc   Shoulder Instructions      Home Living Family/patient expects to be discharged to:: Assisted living                                 Additional Comments: Resides at [facility].      Prior Functioning/Environment Prior Level of Function : Needs assist  Cognitive Assist : ADLs (cognitive);Mobility (cognitive)           Mobility Comments: ambulatory without AD ADLs Comments: Daughter confirms pt able to manage basic self care without physical assist        OT Problem List: Decreased strength;Decreased activity  tolerance;Impaired balance (sitting and/or standing);Decreased safety awareness      OT Treatment/Interventions: Self-care/ADL training;Therapeutic exercise;Patient/family education;Balance training;Energy conservation;Therapeutic activities;DME and/or AE instruction    OT Goals(Current goals can be found in the care plan section) Acute Rehab OT Goals Patient Stated Goal: Return to Home Place with therapy OT Goal Formulation: With patient/family Time For Goal Achievement: 08/24/23 Potential to Achieve Goals: Good ADL Goals Pt Will Perform Grooming: with set-up;with supervision;standing Pt Will Transfer to Toilet: with supervision;ambulating;grab bars Pt Will Perform Toileting - Clothing Manipulation and hygiene: with supervision;sitting/lateral leans  OT Frequency: Min 1X/week                  AM-PAC OT "6 Clicks" Daily Activity     Outcome Measure Help from  another person eating meals?: None Help from another person taking care of personal grooming?: A Little Help from another person toileting, which includes using toliet, bedpan, or urinal?: A Lot Help from another person bathing (including washing, rinsing, drying)?: A Lot Help from another person to put on and taking off regular upper body clothing?: A Little Help from another person to put on and taking off regular lower body clothing?: A Lot 6 Click Score: 16   End of Session Equipment Utilized During Treatment: Gait belt;Rolling walker (2 wheels)  Activity Tolerance: Patient tolerated treatment well;No increased pain Patient left: in chair;with call bell/phone within reach;with family/visitor present  OT Visit Diagnosis: Unsteadiness on feet (R26.81);Muscle weakness (generalized) (M62.81)                Time: 2952-8413 OT Time Calculation (min): 20 min Charges:  OT General Charges $OT Visit: 1 Visit OT Evaluation $OT Eval Low Complexity: 1 Low  Danelle Earthly, MS, OTR/L [place] [facility] 08/10/2023, 3:36 PM

## 2023-08-10 NOTE — Progress Notes (Signed)
PHARMACY CONSULT NOTE - ELECTROLYTES  Pharmacy Consult for Electrolyte Monitoring and Replacement   Recent Labs: Height: 5\' 8"  (172.7 cm) Weight: 90.7 kg (200 lb) IBW/kg (Calculated) : 63.9 Estimated Creatinine Clearance: 40.4 mL/min (A) (by C-G formula based on SCr of 1.22 mg/dL (H)). Potassium (mmol/L)  Date Value  08/10/2023 3.3 (L)  08/29/2011 4.8   Magnesium (mg/dL)  Date Value  16/04/9603 2.6 (H)   Calcium  Date Value  08/10/2023 8.2 mg/dL (L)  54/03/8118 8.9   Albumin  Date Value  08/09/2023 3.0 g/dL (L)  14/78/2956 3.8   Phosphorus (mg/dL)  Date Value  21/30/8657 3.4   Sodium  Date Value  08/10/2023 138 mmol/L  11/29/2017 138  08/29/2011 139 mmol/L   Corrected Ca: 8.8 mg/dL  Assessment  Anita Bennett is a 83 y.o. female presenting with AMS, bacteremia. PMH significant for HTN, HLD, dCHF, hypothyroidism, dementia, depression with anxiety, CKD-3A, liver cirrhosis secondary to NAFLD, kidney stone, obesity . Pharmacy has been consulted to monitor and replace electrolytes.  Diet: NPO, sips w/ meds MIVF: none Pertinent medications: lasix x1 @ 0031 1/17, KCL 40 meq PO x 1 @2323  1/16,  KPHOS 15 mmol IV x 1 @0030  1/17  Goal of Therapy: Electrolytes WNL  Plan:  K 3.3   MD ordered KCL 40 meq PO x 1 Check BMP, Phos with AM labs  Thank you for allowing pharmacy to be a part of this patient's care.  Angelique Blonder, PharmD Clinical Pharmacist 08/10/2023 9:49 AM

## 2023-08-10 NOTE — Progress Notes (Signed)
Approximately 1230-- Pt arrived to room 146A from ED. Upon arrival, VS obtained and full assessment completed by this RN. Isolation precautions initiated. All fall precautions in place.

## 2023-08-10 NOTE — ED Notes (Signed)
Ashley RN aware of assigned bed 

## 2023-08-10 NOTE — Progress Notes (Signed)
Progress Note    Anita Bennett  ZOX:[id number] DOB: 03/14/1941  DOA: 08/08/2023 PCP: Wilford Corner, PA-C      Brief Narrative:    Medical records reviewed and are as summarized below:  Anita Bennett is a 83 y.o. female with medical history significant for hypertension, hyperlipidemia, chronic HFpEF, hypothyroidism, dementia, depression, anxiety, CKD stage IIIa, liver cirrhosis secondary to NAFLD, kidney stones, obesity.  She presented from SNF to the hospital because of altered mental status.  There was also report of nausea and vomiting.  She was seen by her PCP the day prior to admission and was found to have UTI.  She had been prescribed antibiotics but she had not started it yet.  She was hypoxic with oxygen saturation of 86% on room air and she tested positive for COVID-19 infection.  Lactic acid was elevated at 3.7 on admission.  BNP was elevated at 1,199.6.   Assessment/Plan:   Principal Problem:   Severe sepsis (HCC) Active Problems:   E coli bacteremia   Acute pancreatitis   UTI (urinary tract infection)   Essential hypertension   Adult hypothyroidism   Liver cirrhosis secondary to NASH (nonalcoholic steatohepatitis) (HCC)   Chronic diastolic CHF (congestive heart failure) (HCC)   Hypokalemia   Hypophosphatemia   Acute metabolic encephalopathy   Chronic kidney disease, stage 3a (HCC)   COVID-19 virus infection   Anxiety   Obesity (BMI 35.0-39.9 without comorbidity)    Body mass index is 30.41 kg/m.  (Obesity)   Severe sepsis secondary to acute UTI and E. coli bacteremia: Blood culture showed E. coli.  Continue IV ceftriaxone.  Blood culture sensitivity report is pending. Blood pressure is okay   Acute metabolic encephalopathy on underlying dementia: Improved.  Patient appears to be at baseline. Daughter said patient was lethargic and could not even speak before admission.   Acute hypoxemic respiratory failure: Improved.     Nausea  and vomiting: Improved.  Antiemetics as needed. Suspected mild acute pancreatitis on CT abdomen: No abdominal pain.  Lipase was only 87.  Significance of this finding on CT is unclear.   COVID-19 infection: Supportive care as needed. [name], patient's daughter, said patient had COVID-19 infection about 2 weeks prior to admission.   Hypokalemia: Replete potassium and monitor levels. Hypophosphatemia: Improved   Acute thrombocytopenia: Platelet count is improving.  May be related to sepsis.     Probable acute on chronic HFpEF: She had IV Lasix on admission.  Hold oral Lasix and lisinopril for now.   Liver cirrhosis: Compensated.  Continue lactulose and nadolol   Comorbidities include hypertension, hypothyroidism, CKD stage IIIa, depression, anxiety    Diet Order             Diet NPO time specified Except for: Sips with Meds, Ice Chips  Diet effective now                            Consultants: None  Procedures: None    Medications:    aspirin EC  81 mg Oral Daily   enoxaparin (LOVENOX) injection  0.5 mg/kg Subcutaneous Q24H   lactulose  20 g Oral QODAY   levothyroxine  100 mcg Oral Q0600   memantine  5 mg Oral BID   nadolol  20 mg Oral QPM   PARoxetine  40 mg Oral Daily   QUEtiapine  25 mg Oral QHS   Continuous Infusions:  cefTRIAXone (ROCEPHIN)  IV Stopped (08/09/23 2305)     Anti-infectives (From admission, onward)    Start     Dose/Rate Route Frequency Ordered Stop   08/09/23 2000  cefTRIAXone (ROCEPHIN) 2 g in sodium chloride 0.9 % 100 mL IVPB        2 g 200 mL/hr over 30 Minutes Intravenous Every 24 hours 08/08/23 2233     08/08/23 2000  cefTRIAXone (ROCEPHIN) 2 g in sodium chloride 0.9 % 100 mL IVPB        2 g 200 mL/hr over 30 Minutes Intravenous Once 08/08/23 1952 08/08/23 2109   08/08/23 2000  metroNIDAZOLE (FLAGYL) IVPB 500 mg        500 mg 100 mL/hr over 60 Minutes Intravenous  Once 08/08/23 1956 08/08/23 2152               Family Communication/Anticipated D/C date and plan/Code Status   DVT prophylaxis:      Code Status: Full Code  Family Communication: None Disposition Plan: Plan to discharge to SNF   Status is: Inpatient Remains inpatient appropriate because: Sepsis and bacteremia       Subjective:   Interval events noted.  She has no complaints.  She feels much better today.  No abdominal pain, cough, shortness of breath or chest pain.  Objective:    Vitals:   08/10/23 0641 08/10/23 0800 08/10/23 0921 08/10/23 1000  BP: (!) 141/64 (!) 129/46  135/65  Pulse: (!) 59 62  (!) 55  Resp:  16  14  Temp:   97.9 F (36.6 C)   TempSrc:   Oral   SpO2: 98% 95%  94%  Weight:      Height:       No data found.   Intake/Output Summary (Last 24 hours) at 08/10/2023 1140 Last data filed at 08/09/2023 2305 Gross per 24 hour  Intake 110 ml  Output --  Net 110 ml   Filed Weights   08/08/23 1739  Weight: 90.7 kg    Exam:  GEN: NAD SKIN: Warm and dry EYES: No pallor or icterus ENT: MMM CV: RRR PULM: CTA B ABD: soft, ND, NT, +BS CNS: AAO x 2 (person and place), non focal EXT: No edema or tenderness        Data Reviewed:   I have personally reviewed following labs and imaging studies:  Labs: Labs show the following:   Basic Metabolic Panel: Recent Labs  Lab 08/08/23 1744 08/09/23 0554 08/10/23 0639  NA 139 141 138  K 3.2* 3.3* 3.3*  CL 103 108 105  CO2 19* 23 25  GLUCOSE 111* 147* 99  BUN 14 17 35*  CREATININE 1.10* 1.10* 1.22*  CALCIUM 8.7* 8.1* 8.2*  MG 1.8  --  2.6*  PHOS 1.3*  --  3.4   GFR Estimated Creatinine Clearance: 40.4 mL/min (A) (by C-G formula based on SCr of 1.22 mg/dL (H)). Liver Function Tests: Recent Labs  Lab 08/08/23 1744 08/09/23 0554  AST 40 36  ALT 18 16  ALKPHOS 78 35*  BILITOT 3.1* 1.9*  PROT 7.1 5.9*  ALBUMIN 3.8 3.0*   Recent Labs  Lab 08/08/23 1744  LIPASE 87*   Recent Labs  Lab 08/08/23 2234   AMMONIA 26   Coagulation profile No results for input(s): "INR", "PROTIME" in the last 168 hours.  CBC: Recent Labs  Lab 08/08/23 1744 08/09/23 0554 08/10/23 0639  WBC 5.8 8.0 7.0  NEUTROABS 5.3  --  5.5  HGB 10.1* 8.4* 8.4*  HCT 31.7* 26.9* 27.2*  MCV 87.8 90.9 91.0  PLT 137* 93* 105*   Cardiac Enzymes: No results for input(s): "CKTOTAL", "CKMB", "CKMBINDEX", "TROPONINI" in the last 168 hours. BNP (last 3 results) No results for input(s): "PROBNP" in the last 8760 hours. CBG: Recent Labs  Lab 08/10/23 0701 08/10/23 0744  GLUCAP 98 88   D-Dimer: No results for input(s): "DDIMER" in the last 72 hours. Hgb A1c: No results for input(s): "HGBA1C" in the last 72 hours. Lipid Profile: Recent Labs    08/08/23 1744  TRIG 90   Thyroid function studies: No results for input(s): "TSH", "T4TOTAL", "T3FREE", "THYROIDAB" in the last 72 hours.  Invalid input(s): "FREET3" Anemia work up: No results for input(s): "VITAMINB12", "FOLATE", "FERRITIN", "TIBC", "IRON", "RETICCTPCT" in the last 72 hours. Sepsis Labs: Recent Labs  Lab 08/08/23 1744 08/08/23 2025 08/09/23 0554 08/10/23 0639  WBC 5.8  --  8.0 7.0  LATICACIDVEN 3.7* 3.0*  --   --     Microbiology Recent Results (from the past 240 hours)  Resp panel by RT-PCR (RSV, Flu A&B, Covid) Anterior Nasal Swab     Status: Abnormal   Collection Time: 08/08/23  5:44 PM   Specimen: Anterior Nasal Swab  Result Value Ref Range Status   SARS Coronavirus 2 by RT PCR POSITIVE (A) NEGATIVE Final    Comment: (NOTE) SARS-CoV-2 target nucleic acids are DETECTED.  The SARS-CoV-2 RNA is generally detectable in upper respiratory specimens during the acute phase of infection. Positive results are indicative of the presence of the identified virus, but do not rule out bacterial infection or co-infection with other pathogens not detected by the test. Clinical correlation with patient history and other diagnostic information is  necessary to determine patient infection status. The expected result is Negative.  Fact Sheet for Patients: BloggerCourse.com  Fact Sheet for Healthcare Providers: SeriousBroker.it  This test is not yet approved or cleared by the [place] FDA and  has been authorized for detection and/or diagnosis of SARS-CoV-2 by FDA under an Emergency Use Authorization (EUA).  This EUA will remain in effect (meaning this test can be used) for the duration of  the COVID-19 declaration under Section 564(b)(1) of the A ct, 21 U.S.C. section 360bbb-3(b)(1), unless the authorization is terminated or revoked sooner.     Influenza A by PCR NEGATIVE NEGATIVE Final   Influenza B by PCR NEGATIVE NEGATIVE Final    Comment: (NOTE) The Xpert Xpress SARS-CoV-2/FLU/RSV plus assay is intended as an aid in the diagnosis of influenza from Nasopharyngeal swab specimens and should not be used as a sole basis for treatment. Nasal washings and aspirates are unacceptable for Xpert Xpress SARS-CoV-2/FLU/RSV testing.  Fact Sheet for Patients: BloggerCourse.com  Fact Sheet for Healthcare Providers: SeriousBroker.it  This test is not yet approved or cleared by the [place] FDA and has been authorized for detection and/or diagnosis of SARS-CoV-2 by FDA under an Emergency Use Authorization (EUA). This EUA will remain in effect (meaning this test can be used) for the duration of the COVID-19 declaration under Section 564(b)(1) of the Act, 21 U.S.C. section 360bbb-3(b)(1), unless the authorization is terminated or revoked.     Resp Syncytial Virus by PCR NEGATIVE NEGATIVE Final    Comment: (NOTE) Fact Sheet for Patients: BloggerCourse.com  Fact Sheet for Healthcare Providers: SeriousBroker.it  This test is not yet approved or cleared by the [place] FDA  and has been authorized for detection and/or diagnosis of SARS-CoV-2 by FDA under an Emergency Use  Authorization (EUA). This EUA will remain in effect (meaning this test can be used) for the duration of the COVID-19 declaration under Section 564(b)(1) of the Act, 21 U.S.C. section 360bbb-3(b)(1), unless the authorization is terminated or revoked.  Performed at [facility], [address] [place], [place] [address]   Culture, blood (routine x 2)     Status: Abnormal (Preliminary result)   Collection Time: 08/08/23  6:03 PM   Specimen: BLOOD  Result Value Ref Range Status   Specimen Description   Final    BLOOD BLOOD RIGHT FOREARM Performed at [facility], [address] [place], [place] [address]    Special Requests   Final    BOTTLES DRAWN AEROBIC AND ANAEROBIC Blood Culture results may not be optimal due to an inadequate volume of blood received in culture bottles Performed at [facility], [address], [place], [place] [address]    Culture  Setup Time   Final    GRAM NEGATIVE RODS IN BOTH AEROBIC AND ANAEROBIC BOTTLES CRITICAL RESULT CALLED TO, READ BACK BY AND VERIFIED WITH: RAQUEL RODRIGUEZ GUZMAN AT 0710 08/09/23.PMF    Culture (A)  Final    ESCHERICHIA COLI SUSCEPTIBILITIES TO FOLLOW Performed at [facility], [address], [place], [place] [address]    Report Status PENDING  Incomplete  Blood Culture ID Panel (Reflexed)     Status: Abnormal   Collection Time: 08/08/23  6:03 PM  Result Value Ref Range Status   Enterococcus faecalis NOT DETECTED NOT DETECTED Final   Enterococcus Faecium NOT DETECTED NOT DETECTED Final   Listeria monocytogenes NOT DETECTED NOT DETECTED Final   Staphylococcus species NOT DETECTED NOT DETECTED Final   Staphylococcus aureus (BCID) NOT DETECTED NOT DETECTED Final   Staphylococcus epidermidis NOT DETECTED NOT DETECTED Final   Staphylococcus lugdunensis NOT DETECTED NOT DETECTED Final    Streptococcus species NOT DETECTED NOT DETECTED Final   Streptococcus agalactiae NOT DETECTED NOT DETECTED Final   Streptococcus pneumoniae NOT DETECTED NOT DETECTED Final   Streptococcus pyogenes NOT DETECTED NOT DETECTED Final   A.calcoaceticus-baumannii NOT DETECTED NOT DETECTED Final   Bacteroides fragilis NOT DETECTED NOT DETECTED Final   Enterobacterales DETECTED (A) NOT DETECTED Final    Comment: Enterobacterales represent a large order of gram negative bacteria, not a single organism. CRITICAL RESULT CALLED TO, READ BACK BY AND VERIFIED WITH: RAQUEL RODRIGUEZ GUZMAN AT 0710 08/09/23.PMF    Enterobacter cloacae complex NOT DETECTED NOT DETECTED Final   Escherichia coli DETECTED (A) NOT DETECTED Final    Comment: CRITICAL RESULT CALLED TO, READ BACK BY AND VERIFIED WITH: RAQUEL RODRIGUEZ GUZMAN AT 0710 08/09/23.PMF    Klebsiella aerogenes NOT DETECTED NOT DETECTED Final   Klebsiella oxytoca NOT DETECTED NOT DETECTED Final   Klebsiella pneumoniae NOT DETECTED NOT DETECTED Final   Proteus species NOT DETECTED NOT DETECTED Final   Salmonella species NOT DETECTED NOT DETECTED Final   Serratia marcescens NOT DETECTED NOT DETECTED Final   Haemophilus influenzae NOT DETECTED NOT DETECTED Final   Neisseria meningitidis NOT DETECTED NOT DETECTED Final   Pseudomonas aeruginosa NOT DETECTED NOT DETECTED Final   Stenotrophomonas maltophilia NOT DETECTED NOT DETECTED Final   Candida albicans NOT DETECTED NOT DETECTED Final   Candida auris NOT DETECTED NOT DETECTED Final   Candida glabrata NOT DETECTED NOT DETECTED Final   Candida krusei NOT DETECTED NOT DETECTED Final   Candida parapsilosis NOT DETECTED NOT DETECTED Final   Candida tropicalis NOT DETECTED NOT DETECTED Final  Cryptococcus neoformans/gattii NOT DETECTED NOT DETECTED Final   CTX-M ESBL NOT DETECTED NOT DETECTED Final   Carbapenem resistance IMP NOT DETECTED NOT DETECTED Final   Carbapenem resistance KPC NOT DETECTED NOT  DETECTED Final   Carbapenem resistance NDM NOT DETECTED NOT DETECTED Final   Carbapenem resist OXA 48 LIKE NOT DETECTED NOT DETECTED Final   Carbapenem resistance VIM NOT DETECTED NOT DETECTED Final    Comment: Performed at [facility], [address] [place], [place] [address]  Culture, blood (routine x 2)     Status: None (Preliminary result)   Collection Time: 08/08/23  8:25 PM   Specimen: BLOOD  Result Value Ref Range Status   Specimen Description   Final    BLOOD BLOOD LEFT HAND Performed at [facility], [address], [place], [place] [address]    Special Requests   Final    BOTTLES DRAWN AEROBIC AND ANAEROBIC Blood Culture adequate volume Performed at [facility], [address] [place], [place] [address]    Culture  Setup Time   Final    GRAM NEGATIVE RODS IN BOTH AEROBIC AND ANAEROBIC BOTTLES CRITICAL RESULT CALLED TO, READ BACK BY AND VERIFIED WITH: RAQUEL RODRIGUEZ GUZMAN AT 0710 08/09/23.PMF    Culture   Final    GRAM NEGATIVE RODS IDENTIFICATION TO FOLLOW Performed at [facility], [address] [place], [place] [address]    Report Status PENDING  Incomplete    Procedures and diagnostic studies:  CT ABDOMEN PELVIS W CONTRAST Result Date: 08/08/2023 CLINICAL DATA:  Altered mental status, UTI symptoms, vomiting EXAM: CT ABDOMEN AND PELVIS WITH CONTRAST TECHNIQUE: Multidetector CT imaging of the abdomen and pelvis was performed using the standard protocol following bolus administration of intravenous contrast. RADIATION DOSE REDUCTION: This exam was performed according to the departmental dose-optimization program which includes automated exposure control, adjustment of the mA and/or kV according to patient size and/or use of iterative reconstruction technique. CONTRAST:  OMNIPAQUE IOHEXOL 300 MG/ML  SOLN COMPARISON:  None Available. FINDINGS: Lower chest: Trace bilateral pleural effusions. Mild bilateral lower lobe  atelectasis. Hepatobiliary: Nodular hepatic contour, suggesting cirrhosis. No focal hepatic lesion is seen. Status post cholecystectomy. No intrahepatic or extrahepatic ductal dilatation. Pancreas: Mild peripancreatic stranding, including along the pancreatic tail (series 2/image 34), raising the possibility of mild acute pancreatitis. No pancreatic mass, atrophy, or drainable fluid collection/pseudocyst. Spleen: Mildly enlarged, measuring 15.2 cm in maximal craniocaudal dimension. Adrenals/Urinary Tract: Adrenal glands are within normal limits. Kidneys are within normal limits. Mild fullness of the bilateral renal collecting system without frank hydronephrosis. Thick-walled bladder, although underdistended. Stomach/Bowel: Stomach is notable for a small hiatal hernia. No evidence of bowel obstruction. Normal appendix (series 2/image 88). No colonic wall thickening or inflammatory changes. Moderate rectal stool burden, suggesting mild fecal impaction, without associated wall thickening to suggest stercoral colitis. Vascular/Lymphatic: No evidence of abdominal aortic aneurysm. Atherosclerotic calcifications of the abdominal aorta and branch vessels, although vessels remain patent. No suspicious abdominopelvic lymphadenopathy. Reproductive: Status post hysterectomy. No adnexal masses. Other: No abdominopelvic ascites. Musculoskeletal: Degenerative changes of the visualized thoracolumbar spine. Moderate superior endplate compression fracture deformity at L2, likely chronic. IMPRESSION: Suspected mild acute pancreatitis. Thick-walled bladder, although underdistended. Correlate for cystitis. Cirrhosis with mild splenomegaly. Suspected mild rectal fecal impaction, without stercoral colitis. Trace bilateral pleural effusions. Electronically Signed   By: Charline Bills M.D.   On: 08/08/2023 21:39   CT Head Wo Contrast Result Date: 08/08/2023 CLINICAL DATA:  Altered mental  status. EXAM: CT HEAD WITHOUT CONTRAST  TECHNIQUE: Contiguous axial images were obtained from the base of the skull through the vertex without intravenous contrast. RADIATION DOSE REDUCTION: This exam was performed according to the departmental dose-optimization program which includes automated exposure control, adjustment of the mA and/or kV according to patient size and/or use of iterative reconstruction technique. COMPARISON:  None Available. FINDINGS: Brain: There is mild cerebral atrophy with widening of the extra-axial spaces and ventricular dilatation. There are areas of decreased attenuation within the white matter tracts of the supratentorial brain, consistent with microvascular disease changes. Vascular: Moderate to marked severity bilateral cavernous carotid artery calcification is noted. Skull: Normal. Negative for fracture or focal lesion. Sinuses/Orbits: No acute finding. Other: None. IMPRESSION: 1. No acute intracranial abnormality. 2. Generalized cerebral atrophy with widening of the extra-axial spaces and ventricular dilatation. Electronically Signed   By: Aram Candela M.D.   On: 08/08/2023 19:07   DG Chest 1 View Result Date: 08/08/2023 CLINICAL DATA:  Altered mental status. EXAM: CHEST  1 VIEW COMPARISON:  April 30, 2021 FINDINGS: The cardiac silhouette is mildly enlarged and unchanged in size. Mild, diffuse, chronic appearing increased interstitial lung markings are seen. Mild, stable areas of linear scarring and/or atelectasis are seen within the mid right lung and left lung base. No pleural effusion or pneumothorax is identified. Multilevel degenerative changes are seen throughout the thoracic spine. IMPRESSION: Chronic appearing increased interstitial lung markings with mild, stable areas of linear scarring and/or atelectasis within the mid right lung and left lung base. Electronically Signed   By: Aram Candela M.D.   On: 08/08/2023 19:06               LOS: 2 days   Krystiana Fornes  [facility]    Pager on [email or web address]. If 7PM-7AM, please contact night-coverage at [email or web address]     08/10/2023, 11:40 AM

## 2023-08-11 DIAGNOSIS — A419 Sepsis, unspecified organism: Secondary | ICD-10-CM | POA: Diagnosis not present

## 2023-08-11 DIAGNOSIS — R6520 Severe sepsis without septic shock: Secondary | ICD-10-CM | POA: Diagnosis not present

## 2023-08-11 LAB — BASIC METABOLIC PANEL
Anion gap: 9 (ref 5–15)
BUN: 35 mg/dL — ABNORMAL HIGH (ref 8–23)
CO2: 25 mmol/L (ref 22–32)
Calcium: 8.4 mg/dL — ABNORMAL LOW (ref 8.9–10.3)
Chloride: 108 mmol/L (ref 98–111)
Creatinine, Ser: 1.17 mg/dL — ABNORMAL HIGH (ref 0.44–1.00)
GFR, Estimated: 46 mL/min — ABNORMAL LOW (ref >60)
Glucose, Bld: 85 mg/dL (ref 70–99)
Potassium: 4 mmol/L (ref 3.5–5.1)
Sodium: 142 mmol/L (ref 135–145)

## 2023-08-11 LAB — CBC
HCT: 28.5 % — ABNORMAL LOW (ref 36.0–46.0)
Hemoglobin: 9.1 g/dL — ABNORMAL LOW (ref 12.0–15.0)
MCH: 28.6 pg (ref 26.0–34.0)
MCHC: 31.9 g/dL (ref 30.0–36.0)
MCV: 89.6 fL (ref 80.0–100.0)
Platelets: 117 10*3/uL — ABNORMAL LOW (ref 150–400)
RBC: 3.18 MIL/uL — ABNORMAL LOW (ref 3.87–5.11)
RDW: 14.6 % (ref 11.5–15.5)
WBC: 4.9 10*3/uL (ref 4.0–10.5)
nRBC: 0 % (ref 0.0–0.2)

## 2023-08-11 LAB — CULTURE, BLOOD (ROUTINE X 2): Special Requests: ADEQUATE

## 2023-08-11 LAB — PHOSPHORUS: Phosphorus: 2.6 mg/dL (ref 2.5–4.6)

## 2023-08-11 LAB — MAGNESIUM: Magnesium: 2.3 mg/dL (ref 1.7–2.4)

## 2023-08-11 MED ORDER — LISINOPRIL 5 MG PO TABS
5.0000 mg | ORAL_TABLET | Freq: Every day | ORAL | Status: DC
Start: 2023-08-11 — End: 2023-08-15
  Administered 2023-08-11 – 2023-08-15 (×5): 5 mg via ORAL
  Filled 2023-08-11 (×5): qty 1

## 2023-08-11 MED ORDER — FUROSEMIDE 40 MG PO TABS
40.0000 mg | ORAL_TABLET | Freq: Every day | ORAL | Status: DC
  Administered 2023-08-11 – 2023-08-15 (×5): 40 mg via ORAL
  Filled 2023-08-11 (×5): qty 1

## 2023-08-11 NOTE — Progress Notes (Signed)
PHARMACY CONSULT NOTE - ELECTROLYTES  Pharmacy Consult for Electrolyte Monitoring and Replacement   Recent Labs: Height: 5\' 7"  (170.2 cm) Weight: 92.4 kg (203 lb 11.3 oz) IBW/kg (Calculated) : 61.6 Estimated Creatinine Clearance: 41.8 mL/min (A) (by C-G formula based on SCr of 1.17 mg/dL (H)). Potassium (mmol/L)  Date Value  08/11/2023 4.0  08/29/2011 4.8   Magnesium (mg/dL)  Date Value  16/04/9603 2.3   Calcium  Date Value  08/11/2023 8.4 mg/dL (L)  54/03/8118 8.9   Albumin  Date Value  08/09/2023 3.0 g/dL (L)  14/78/2956 3.8   Phosphorus (mg/dL)  Date Value  21/30/8657 2.6   Sodium  Date Value  08/11/2023 142 mmol/L  11/29/2017 138  08/29/2011 139 mmol/L   Corrected Ca: 8.8 mg/dL  Assessment  Anita Bennett is a 83 y.o. female presenting with AMS, bacteremia. PMH significant for HTN, HLD, dCHF, hypothyroidism, dementia, depression with anxiety, CKD-3A, liver cirrhosis secondary to NAFLD, kidney stone, obesity . Pharmacy has been consulted to monitor and replace electrolytes.  Diet: heart  MIVF: none Pertinent medications: lasix 40 po daily, lactulose QOD  Goal of Therapy: Electrolytes WNL  Plan:  No electrolyte replacement at this time Check BMP, Phos with AM labs  Thank you for allowing pharmacy to be a part of this patient's care.  Angelique Blonder, PharmD Clinical Pharmacist 08/11/2023 11:27 AM

## 2023-08-11 NOTE — Progress Notes (Signed)
Progress Note    Anita Bennett  [name]:[id number] DOB: October 19, 1940  DOA: 08/08/2023 PCP: Wilford Corner, PA-C      Brief Narrative:    Medical records reviewed and are as summarized below:  Anita Bennett is a 83 y.o. female with medical history significant for hypertension, hyperlipidemia, chronic HFpEF, hypothyroidism, dementia, depression, anxiety, CKD stage IIIa, liver cirrhosis secondary to NAFLD, kidney stones, obesity.  She presented from SNF to the hospital because of altered mental status.  There was also report of nausea and vomiting.  She was seen by her PCP the day prior to admission and was found to have UTI.  She had been prescribed antibiotics but she had not started it yet.  She was hypoxic with oxygen saturation of 86% on room air and she tested positive for COVID-19 infection.  Lactic acid was elevated at 3.7 on admission.  BNP was elevated at 1,199.6.   Assessment/Plan:   Principal Problem:   Severe sepsis (HCC) Active Problems:   E coli bacteremia   Acute pancreatitis   UTI (urinary tract infection)   Essential hypertension   Adult hypothyroidism   Liver cirrhosis secondary to NASH (nonalcoholic steatohepatitis) (HCC)   Chronic diastolic CHF (congestive heart failure) (HCC)   Hypokalemia   Hypophosphatemia   Acute metabolic encephalopathy   Chronic kidney disease, stage 3a (HCC)   COVID-19 virus infection   Anxiety   Obesity (BMI 35.0-39.9 without comorbidity)    Body mass index is 31.9 kg/m.  (Obesity)   Severe sepsis secondary to acute UTI and E. coli bacteremia: Blood culture showed E. coli.  Continue IV ceftriaxone for 1 more day and switch to oral Levaquin tomorrow.  E. coli is sensitive to quinolones.     Acute metabolic encephalopathy on underlying dementia: Improved.  Patient appears to be at baseline. Daughter said patient was lethargic and could not even speak before admission.   Acute hypoxemic respiratory failure:  Improved.     Nausea and vomiting: Improved.  Antiemetics as needed. Suspected mild acute pancreatitis on CT abdomen: No abdominal pain.  Lipase was only 87.  Significance of this finding on CT is unclear.   COVID-19 infection: Supportive care as needed. [name], patient's daughter, said patient had COVID-19 infection about 2 weeks prior to admission.   Hypokalemia, hypophosphatemia: Improved    Acute thrombocytopenia: Platelet count is improving.  May be related to sepsis.     Probable acute on chronic HFpEF: She had IV Lasix on admission.  Hypertension: BP is elevated.  Restart oral Lasix and lisinopril   Liver cirrhosis: Compensated.  Continue lactulose and nadolol   Comorbidities include hypertension, hypothyroidism, CKD stage IIIa, depression, anxiety    Diet Order             Diet Heart Fluid consistency: Thin  Diet effective now                            Consultants: None  Procedures: None    Medications:    aspirin EC  81 mg Oral Daily   enoxaparin (LOVENOX) injection  0.5 mg/kg Subcutaneous Q24H   feeding supplement  237 mL Oral BID BM   furosemide  40 mg Oral Daily   lactulose  20 g Oral QODAY   levothyroxine  100 mcg Oral Q0600   lisinopril  5 mg Oral Daily   memantine  5 mg Oral BID   nadolol  20  mg Oral QPM   PARoxetine  40 mg Oral Daily   QUEtiapine  25 mg Oral QHS   Continuous Infusions:  cefTRIAXone (ROCEPHIN)  IV Stopped (08/10/23 2138)     Anti-infectives (From admission, onward)    Start     Dose/Rate Route Frequency Ordered Stop   08/09/23 2000  cefTRIAXone (ROCEPHIN) 2 g in sodium chloride 0.9 % 100 mL IVPB        2 g 200 mL/hr over 30 Minutes Intravenous Every 24 hours 08/08/23 2233     08/08/23 2000  cefTRIAXone (ROCEPHIN) 2 g in sodium chloride 0.9 % 100 mL IVPB        2 g 200 mL/hr over 30 Minutes Intravenous Once 08/08/23 1952 08/08/23 2109   08/08/23 2000  metroNIDAZOLE (FLAGYL) IVPB 500 mg        500 mg 100  mL/hr over 60 Minutes Intravenous  Once 08/08/23 1956 08/08/23 2152              Family Communication/Anticipated D/C date and plan/Code Status   DVT prophylaxis:      Code Status: Full Code  Family Communication: None Disposition Plan: Plan to discharge to SNF   Status is: Inpatient Remains inpatient appropriate because: Sepsis and bacteremia       Subjective:   Interval events noted.  She feels better today.  No cough, shortness of breath or chest pain.  No abdominal pain no vomiting.  Objective:    Vitals:   08/10/23 1601 08/11/23 0024 08/11/23 0931 08/11/23 1144  BP: (!) 151/61 (!) 154/55 (!) 190/80 (!) 189/72  Pulse: 61 (!) 51 (!) 53   Resp: 16 17 16    Temp: 98 F (36.7 C) 98.3 F (36.8 C) 98.1 F (36.7 C)   TempSrc: Oral Oral Oral   SpO2: 97% 96% 98%   Weight:      Height:       No data found.   Intake/Output Summary (Last 24 hours) at 08/11/2023 1157 Last data filed at 08/11/2023 0551 Gross per 24 hour  Intake 318.43 ml  Output 700 ml  Net -381.57 ml   Filed Weights   08/08/23 1739 08/10/23 1300  Weight: 90.7 kg 92.4 kg    Exam:  GEN: NAD, sitting up in the chair SKIN: Warm and dry EYES: No pallor or icterus ENT: MMM CV: RRR PULM: CTA B ABD: soft, ND, NT, +BS CNS: AAO x 2 (person and place), non focal EXT: No edema or tenderness       Data Reviewed:   I have personally reviewed following labs and imaging studies:  Labs: Labs show the following:   Basic Metabolic Panel: Recent Labs  Lab 08/08/23 1744 08/09/23 0554 08/10/23 0639 08/11/23 0333  NA 139 141 138 142  K 3.2* 3.3* 3.3* 4.0  CL 103 108 105 108  CO2 19* 23 25 25   GLUCOSE 111* 147* 99 85  BUN 14 17 35* 35*  CREATININE 1.10* 1.10* 1.22* 1.17*  CALCIUM 8.7* 8.1* 8.2* 8.4*  MG 1.8  --  2.6* 2.3  PHOS 1.3*  --  3.4 2.6   GFR Estimated Creatinine Clearance: 41.8 mL/min (A) (by C-G formula based on SCr of 1.17 mg/dL (H)). Liver Function Tests: Recent  Labs  Lab 08/08/23 1744 08/09/23 0554  AST 40 36  ALT 18 16  ALKPHOS 78 35*  BILITOT 3.1* 1.9*  PROT 7.1 5.9*  ALBUMIN 3.8 3.0*   Recent Labs  Lab 08/08/23 1744  LIPASE 87*  Recent Labs  Lab 08/08/23 2234  AMMONIA 26   Coagulation profile No results for input(s): "INR", "PROTIME" in the last 168 hours.  CBC: Recent Labs  Lab 08/08/23 1744 08/09/23 0554 08/10/23 0639 08/11/23 0333  WBC 5.8 8.0 7.0 4.9  NEUTROABS 5.3  --  5.5  --   HGB 10.1* 8.4* 8.4* 9.1*  HCT 31.7* 26.9* 27.2* 28.5*  MCV 87.8 90.9 91.0 89.6  PLT 137* 93* 105* 117*   Cardiac Enzymes: No results for input(s): "CKTOTAL", "CKMB", "CKMBINDEX", "TROPONINI" in the last 168 hours. BNP (last 3 results) No results for input(s): "PROBNP" in the last 8760 hours. CBG: Recent Labs  Lab 08/10/23 0701 08/10/23 0744 08/10/23 1155 08/10/23 1308  GLUCAP 98 88 78 78   D-Dimer: No results for input(s): "DDIMER" in the last 72 hours. Hgb A1c: No results for input(s): "HGBA1C" in the last 72 hours. Lipid Profile: Recent Labs    08/08/23 1744  TRIG 90   Thyroid function studies: No results for input(s): "TSH", "T4TOTAL", "T3FREE", "THYROIDAB" in the last 72 hours.  Invalid input(s): "FREET3" Anemia work up: No results for input(s): "VITAMINB12", "FOLATE", "FERRITIN", "TIBC", "IRON", "RETICCTPCT" in the last 72 hours. Sepsis Labs: Recent Labs  Lab 08/08/23 1744 08/08/23 2025 08/09/23 0554 08/10/23 0639 08/11/23 0333  WBC 5.8  --  8.0 7.0 4.9  LATICACIDVEN 3.7* 3.0*  --   --   --     Microbiology Recent Results (from the past 240 hours)  Resp panel by RT-PCR (RSV, Flu A&B, Covid) Anterior Nasal Swab     Status: Abnormal   Collection Time: 08/08/23  5:44 PM   Specimen: Anterior Nasal Swab  Result Value Ref Range Status   SARS Coronavirus 2 by RT PCR POSITIVE (A) NEGATIVE Final    Comment: (NOTE) SARS-CoV-2 target nucleic acids are DETECTED.  The SARS-CoV-2 RNA is generally detectable in  upper respiratory specimens during the acute phase of infection. Positive results are indicative of the presence of the identified virus, but do not rule out bacterial infection or co-infection with other pathogens not detected by the test. Clinical correlation with patient history and other diagnostic information is necessary to determine patient infection status. The expected result is Negative.  Fact Sheet for Patients: BloggerCourse.com  Fact Sheet for Healthcare Providers: SeriousBroker.it  This test is not yet approved or cleared by the [place] FDA and  has been authorized for detection and/or diagnosis of SARS-CoV-2 by FDA under an Emergency Use Authorization (EUA).  This EUA will remain in effect (meaning this test can be used) for the duration of  the COVID-19 declaration under Section 564(b)(1) of the A ct, 21 U.S.C. section 360bbb-3(b)(1), unless the authorization is terminated or revoked sooner.     Influenza A by PCR NEGATIVE NEGATIVE Final   Influenza B by PCR NEGATIVE NEGATIVE Final    Comment: (NOTE) The Xpert Xpress SARS-CoV-2/FLU/RSV plus assay is intended as an aid in the diagnosis of influenza from Nasopharyngeal swab specimens and should not be used as a sole basis for treatment. Nasal washings and aspirates are unacceptable for Xpert Xpress SARS-CoV-2/FLU/RSV testing.  Fact Sheet for Patients: BloggerCourse.com  Fact Sheet for Healthcare Providers: SeriousBroker.it  This test is not yet approved or cleared by the [place] FDA and has been authorized for detection and/or diagnosis of SARS-CoV-2 by FDA under an Emergency Use Authorization (EUA). This EUA will remain in effect (meaning this test can be used) for the duration of the COVID-19 declaration under  Section 564(b)(1) of the Act, 21 U.S.C. section 360bbb-3(b)(1), unless the authorization is  terminated or revoked.     Resp Syncytial Virus by PCR NEGATIVE NEGATIVE Final    Comment: (NOTE) Fact Sheet for Patients: BloggerCourse.com  Fact Sheet for Healthcare Providers: SeriousBroker.it  This test is not yet approved or cleared by the [place] FDA and has been authorized for detection and/or diagnosis of SARS-CoV-2 by FDA under an Emergency Use Authorization (EUA). This EUA will remain in effect (meaning this test can be used) for the duration of the COVID-19 declaration under Section 564(b)(1) of the Act, 21 U.S.C. section 360bbb-3(b)(1), unless the authorization is terminated or revoked.  Performed at [facility], [address] [place], [place] [address]   Culture, blood (routine x 2)     Status: Abnormal (Preliminary result)   Collection Time: 08/08/23  6:03 PM   Specimen: BLOOD  Result Value Ref Range Status   Specimen Description   Final    BLOOD BLOOD RIGHT FOREARM Performed at [facility], [address], [place], [place] [address]    Special Requests   Final    BOTTLES DRAWN AEROBIC AND ANAEROBIC Blood Culture results may not be optimal due to an inadequate volume of blood received in culture bottles Performed at [facility], [address] [place], [place] [address]    Culture  Setup Time   Final    GRAM NEGATIVE RODS IN BOTH AEROBIC AND ANAEROBIC BOTTLES CRITICAL RESULT CALLED TO, READ BACK BY AND VERIFIED WITH: RAQUEL RODRIGUEZ GUZMAN AT 0710 08/09/23.PMF Performed at [facility], [address], [place], [place] [address]    Culture ESCHERICHIA COLI (A)  Final   Report Status PENDING  Incomplete   Organism ID, Bacteria ESCHERICHIA COLI  Final   Organism ID, Bacteria ESCHERICHIA COLI  Final      Susceptibility   Escherichia coli - KIRBY BAUER*    CEFAZOLIN INTERMEDIATE Intermediate    Escherichia coli - MIC*    AMPICILLIN >=32 RESISTANT Resistant      CEFEPIME <=0.12 SENSITIVE Sensitive     CEFTAZIDIME <=1 SENSITIVE Sensitive     CEFTRIAXONE <=0.25 SENSITIVE Sensitive     CIPROFLOXACIN <=0.25 SENSITIVE Sensitive     GENTAMICIN <=1 SENSITIVE Sensitive     IMIPENEM <=0.25 SENSITIVE Sensitive     TRIMETH/SULFA >=320 RESISTANT Resistant     AMPICILLIN/SULBACTAM 16 INTERMEDIATE Intermediate     PIP/TAZO <=4 SENSITIVE Sensitive ug/mL    * ESCHERICHIA COLI    ESCHERICHIA COLI  Blood Culture ID Panel (Reflexed)     Status: Abnormal   Collection Time: 08/08/23  6:03 PM  Result Value Ref Range Status   Enterococcus faecalis NOT DETECTED NOT DETECTED Final   Enterococcus Faecium NOT DETECTED NOT DETECTED Final   Listeria monocytogenes NOT DETECTED NOT DETECTED Final   Staphylococcus species NOT DETECTED NOT DETECTED Final   Staphylococcus aureus (BCID) NOT DETECTED NOT DETECTED Final   Staphylococcus epidermidis NOT DETECTED NOT DETECTED Final   Staphylococcus lugdunensis NOT DETECTED NOT DETECTED Final   Streptococcus species NOT DETECTED NOT DETECTED Final   Streptococcus agalactiae NOT DETECTED NOT DETECTED Final   Streptococcus pneumoniae NOT DETECTED NOT DETECTED Final   Streptococcus pyogenes NOT DETECTED NOT DETECTED Final   A.calcoaceticus-baumannii NOT DETECTED NOT DETECTED Final   Bacteroides fragilis NOT DETECTED NOT DETECTED Final   Enterobacterales DETECTED (A) NOT DETECTED Final    Comment: Enterobacterales represent a large order of gram negative bacteria, not  a single organism. CRITICAL RESULT CALLED TO, READ BACK BY AND VERIFIED WITH: RAQUEL RODRIGUEZ GUZMAN AT 0710 08/09/23.PMF    Enterobacter cloacae complex NOT DETECTED NOT DETECTED Final   Escherichia coli DETECTED (A) NOT DETECTED Final    Comment: CRITICAL RESULT CALLED TO, READ BACK BY AND VERIFIED WITH: RAQUEL RODRIGUEZ GUZMAN AT 0710 08/09/23.PMF    Klebsiella aerogenes NOT DETECTED NOT DETECTED Final   Klebsiella oxytoca NOT DETECTED NOT DETECTED Final    Klebsiella pneumoniae NOT DETECTED NOT DETECTED Final   Proteus species NOT DETECTED NOT DETECTED Final   Salmonella species NOT DETECTED NOT DETECTED Final   Serratia marcescens NOT DETECTED NOT DETECTED Final   Haemophilus influenzae NOT DETECTED NOT DETECTED Final   Neisseria meningitidis NOT DETECTED NOT DETECTED Final   Pseudomonas aeruginosa NOT DETECTED NOT DETECTED Final   Stenotrophomonas maltophilia NOT DETECTED NOT DETECTED Final   Candida albicans NOT DETECTED NOT DETECTED Final   Candida auris NOT DETECTED NOT DETECTED Final   Candida glabrata NOT DETECTED NOT DETECTED Final   Candida krusei NOT DETECTED NOT DETECTED Final   Candida parapsilosis NOT DETECTED NOT DETECTED Final   Candida tropicalis NOT DETECTED NOT DETECTED Final   Cryptococcus neoformans/gattii NOT DETECTED NOT DETECTED Final   CTX-M ESBL NOT DETECTED NOT DETECTED Final   Carbapenem resistance IMP NOT DETECTED NOT DETECTED Final   Carbapenem resistance KPC NOT DETECTED NOT DETECTED Final   Carbapenem resistance NDM NOT DETECTED NOT DETECTED Final   Carbapenem resist OXA 48 LIKE NOT DETECTED NOT DETECTED Final   Carbapenem resistance VIM NOT DETECTED NOT DETECTED Final    Comment: Performed at [facility], [address] [place], [place] [address]  Culture, blood (routine x 2)     Status: Abnormal (Preliminary result)   Collection Time: 08/08/23  8:25 PM   Specimen: BLOOD  Result Value Ref Range Status   Specimen Description   Final    BLOOD BLOOD LEFT HAND Performed at [facility], [address] [place], [place] [address]    Special Requests   Final    BOTTLES DRAWN AEROBIC AND ANAEROBIC Blood Culture adequate volume Performed at [facility], [address] [place], [place] [address]    Culture  Setup Time   Final    GRAM NEGATIVE RODS IN BOTH AEROBIC AND ANAEROBIC BOTTLES CRITICAL RESULT CALLED TO, READ BACK BY AND VERIFIED WITH: RAQUEL RODRIGUEZ GUZMAN AT 0710  08/09/23.PMF    Culture (A)  Final    ESCHERICHIA COLI SUSCEPTIBILITIES PERFORMED ON PREVIOUS CULTURE WITHIN THE LAST 5 DAYS. Performed at [facility], [address], [place], [place] [address]    Report Status PENDING  Incomplete    Procedures and diagnostic studies:  No results found.              LOS: 3 days   Ahlam Piscitelli  [facility]   Pager on [email or web address]. If 7PM-7AM, please contact night-coverage at [email or web address]     08/11/2023, 11:57 AM

## 2023-08-11 NOTE — Plan of Care (Signed)
Patient alert and oriented to self and situation. Forgetful in regards to place and time, easily redirected. Remains on Droplet Isolation precautions. No acute distress noted. Will continue to monitor.   Problem: Education: Goal: Knowledge of risk factors and measures for prevention of condition will improve Outcome: Progressing   Problem: Coping: Goal: Psychosocial and spiritual needs will be supported Outcome: Progressing   Problem: Respiratory: Goal: Will maintain a patent airway Outcome: Progressing   Problem: Clinical Measurements: Goal: Ability to maintain clinical measurements within normal limits will improve Outcome: Progressing

## 2023-08-11 NOTE — Evaluation (Signed)
Physical Therapy Evaluation Patient Details Name: Anita Bennett MRN: [id number] DOB: September 08, 1940 Today's Date: 08/11/2023  History of Present Illness  Anita Bennett is a 83 y.o. female past medical history significant for dementia, [name], who presents to the emergency department for altered mental status.  Patient lives at nursing facility but normally takes care of herself on her own.  Patient is here with her daughter who is at bedside.  Yesterday was evaluated at her primary care physician office and we will called in antibiotics for possible urinary tract infection.  Today she had significantly worsening altered mental status with not opening her eyes or responding.  Episode of nausea and vomiting.  Decreased p.o. intake today.  No known significant falls or head trauma. + Covid 19.   Clinical Impression  Patient received in room, up in recliner. She is pleasant and agrees to PT assessment. Patient is HOH. She requires increased time and effort plus mod A to stand from recliner. Patient reporting LE soreness. She ambulated into bathroom and back to recliner with RW and cga ( chair following for safety). Patient reports weakness in L LE during mobility, but discomfort seemed to improve with more mobility. Patient is limited by fatigue/weakness. She will continue to benefit from skilled PT to improve strength, endurance and safety with mobility.         If plan is discharge home, recommend the following: A little help with walking and/or transfers;A little help with bathing/dressing/bathroom;Assist for transportation   Can travel by private vehicle   No    Equipment Recommendations None recommended by PT  Recommendations for Other Services       Functional Status Assessment Patient has had a recent decline in their functional status and demonstrates the ability to make significant improvements in function in a reasonable and predictable amount of time.     Precautions / Restrictions  Precautions Precautions: Fall Precaution Comments: Airborne iso Restrictions Weight Bearing Restrictions Per Provider Order: No      Mobility  Bed Mobility               General bed mobility comments: NT, patient received in recliner and remained in recliner at end of session    Transfers Overall transfer level: Needs assistance Equipment used: Rolling walker (2 wheels) Transfers: Sit to/from Stand Sit to Stand: Mod assist           General transfer comment: Mod assist needed to stand from low recliner with increased time and effort. Min A to stand from elevated toilet. Cues needed for hand placement.    Ambulation/Gait Ambulation/Gait assistance: Contact guard assist Gait Distance (Feet): 25 Feet Assistive device: Rolling walker (2 wheels) Gait Pattern/deviations: Step-through pattern, Decreased step length - right, Decreased step length - left, Decreased stride length, Trunk flexed Gait velocity: decr     General Gait Details: patient requires cues for maintaining safe distance to RW ( tends to push it out too far). Cues for safety. Chair follow for safety due to LE weakness  Stairs            Wheelchair Mobility     Tilt Bed    Modified Rankin (Stroke Patients Only)       Balance Overall balance assessment: Needs assistance Sitting-balance support: Feet supported Sitting balance-Leahy Scale: Good     Standing balance support: Bilateral upper extremity supported, During functional activity, Reliant on assistive device for balance Standing balance-Leahy Scale: Fair Standing balance comment: no LOB, but weak  Pertinent Vitals/Pain Pain Assessment Pain Assessment: Faces Faces Pain Scale: Hurts a little bit Pain Location: "a little soreness in my legs." Pain Descriptors / Indicators: Discomfort, Sore Pain Intervention(s): Monitored during session, Repositioned    Home Living Family/patient expects to  be discharged to:: Assisted living                 Home Equipment: Rolling Walker (2 wheels) Additional Comments: Resides at [facility].    Prior Function Prior Level of Function : Needs assist  Cognitive Assist : ADLs (cognitive);Mobility (cognitive)           Mobility Comments: ambulatory without AD-per OT, but patient told me she uses Walker at baseline ADLs Comments: Daughter confirms pt able to manage basic self care without physical assist     Extremity/Trunk Assessment   Upper Extremity Assessment Upper Extremity Assessment: Defer to OT evaluation    Lower Extremity Assessment Lower Extremity Assessment: Generalized weakness;LLE deficits/detail LLE Deficits / Details: reports L LE is weak LLE Coordination: decreased gross motor    Cervical / Trunk Assessment Cervical / Trunk Assessment: Normal  Communication   Communication Communication: Hearing impairment Cueing Techniques: Verbal cues  Cognition Arousal: Alert Behavior During Therapy: WFL for tasks assessed/performed Overall Cognitive Status: History of cognitive impairments - at baseline                                 General Comments: Not oriented to year; hx of dementia; follows 1 steps commands without difficulty as long as speaker is loud (pt is HOH and not wearing hearing aids)        General Comments      Exercises     Assessment/Plan    PT Assessment Patient needs continued PT services  PT Problem List Decreased strength;Decreased activity tolerance;Decreased balance;Decreased mobility;Decreased coordination;Decreased cognition;Decreased knowledge of use of DME;Decreased safety awareness;Pain       PT Treatment Interventions DME instruction;Gait training;Functional mobility training;Therapeutic activities;Therapeutic exercise;Balance training;Neuromuscular re-education;Cognitive remediation;Patient/family education    PT Goals (Current goals can be found in the Care Plan  section)  Acute Rehab PT Goals Patient Stated Goal: improve strength. "I hate feeling like this" PT Goal Formulation: With patient Time For Goal Achievement: 08/23/23 Potential to Achieve Goals: Good    Frequency Min 1X/week     Co-evaluation               AM-PAC PT "6 Clicks" Mobility  Outcome Measure Help needed turning from your back to your side while in a flat bed without using bedrails?: A Lot Help needed moving from lying on your back to sitting on the side of a flat bed without using bedrails?: A Lot Help needed moving to and from a bed to a chair (including a wheelchair)?: A Little Help needed standing up from a chair using your arms (e.g., wheelchair or bedside chair)?: A Lot Help needed to walk in hospital room?: A Little Help needed climbing 3-5 steps with a railing? : Total 6 Click Score: 13    End of Session Equipment Utilized During Treatment: Gait belt Activity Tolerance: Patient limited by fatigue Patient left: in chair;with call bell/phone within reach;with chair alarm set Nurse Communication: Mobility status PT Visit Diagnosis: Other abnormalities of gait and mobility (R26.89);Muscle weakness (generalized) (M62.81);Difficulty in walking, not elsewhere classified (R26.2)    Time: 2841-3244 PT Time Calculation (min) (ACUTE ONLY): 23 min   Charges:   PT Evaluation $  PT Eval Moderate Complexity: 1 Mod PT Treatments $Gait Training: 8-22 mins PT General Charges $$ ACUTE PT VISIT: 1 Visit         Rafay Dahan, PT, GCS 08/11/23,11:54 AM

## 2023-08-12 DIAGNOSIS — A419 Sepsis, unspecified organism: Secondary | ICD-10-CM | POA: Diagnosis not present

## 2023-08-12 DIAGNOSIS — R6520 Severe sepsis without septic shock: Secondary | ICD-10-CM | POA: Diagnosis not present

## 2023-08-12 LAB — MAGNESIUM: Magnesium: 2.3 mg/dL (ref 1.7–2.4)

## 2023-08-12 LAB — RENAL FUNCTION PANEL
Albumin: 3 g/dL — ABNORMAL LOW (ref 3.5–5.0)
Anion gap: 9 (ref 5–15)
BUN: 25 mg/dL — ABNORMAL HIGH (ref 8–23)
CO2: 25 mmol/L (ref 22–32)
Calcium: 8.4 mg/dL — ABNORMAL LOW (ref 8.9–10.3)
Chloride: 106 mmol/L (ref 98–111)
Creatinine, Ser: 1.02 mg/dL — ABNORMAL HIGH (ref 0.44–1.00)
GFR, Estimated: 54 mL/min — ABNORMAL LOW (ref >60)
Glucose, Bld: 92 mg/dL (ref 70–99)
Phosphorus: 3.4 mg/dL (ref 2.5–4.6)
Potassium: 3.6 mmol/L (ref 3.5–5.1)
Sodium: 140 mmol/L (ref 135–145)

## 2023-08-12 MED ORDER — LEVOFLOXACIN 750 MG PO TABS: 750.0000 mg | ORAL_TABLET | ORAL | 0 refills | Status: AC

## 2023-08-12 MED ORDER — LEVOFLOXACIN 250 MG PO TABS
750.0000 mg | ORAL_TABLET | ORAL | Status: DC
  Administered 2023-08-12 – 2023-08-14 (×2): 750 mg via ORAL
  Filled 2023-08-12 (×2): qty 3

## 2023-08-12 MED ORDER — ACETAMINOPHEN 500 MG PO TABS: 500.0000 mg | ORAL_TABLET | Freq: Four times a day (QID) | ORAL | Status: DC | PRN

## 2023-08-12 MED ORDER — LACTULOSE 20 GM/30ML PO SOLN: 20.0000 g | Freq: Every day | ORAL | Status: DC

## 2023-08-12 NOTE — Plan of Care (Signed)

## 2023-08-12 NOTE — [place]2 (Signed)
[place] MEDICAID FL2 LEVEL OF CARE FORM     IDENTIFICATION  Patient Name: Anita Bennett Birthdate: October 25, 1940 Sex: female Admission Date (Current Location): 08/08/2023  [facility] and [place] Number:  Chiropodist and Address:  [facility], [address] [place], [place] [address]      Provider Number: [id number]  Attending Physician Name and Address:  Lurene Shadow, MD  Relative Name and Phone Number:  Jim Desanctis  Daughter  Emergency Contact  [phone]    Current Level of Care: Hospital Recommended Level of Care: Skilled Nursing Facility Prior Approval Number:    Date Approved/Denied:   PASRR Number: [id number]  Discharge Plan: SNF    Current Diagnoses: Patient Active Problem List   Diagnosis Date Noted   Acute metabolic encephalopathy 08/09/2023   Chronic diastolic CHF (congestive heart failure) (HCC) 08/09/2023   Severe sepsis (HCC) 08/09/2023   E coli bacteremia 08/09/2023   COVID-19 virus infection 08/08/2023   Acute pancreatitis 08/08/2023   UTI (urinary tract infection) 08/08/2023   Hypokalemia 08/08/2023   Chronic kidney disease, stage 3a (HCC) 08/08/2023   Hypophosphatemia 08/08/2023   Acquired hallux rigidus 08/01/2018   Enthesopathy of right hip 04/03/2018   Closed transverse fracture of patella with malunion, left 01/21/2018   Posterior tibial tendinitis of left leg 01/21/2018   S/P left unicompartmental knee replacement 12/19/2017   H/O iron deficiency anemia 05/29/2017   Impaired glucose tolerance 08/14/2016   Acute cystitis without hematuria 07/04/2016   Incomplete emptying of bladder 02/02/2016   Urinary hesitancy 02/02/2016   Increased frequency of urination 01/31/2016   Aortic heart murmur on examination 12/06/2015   Urinary incontinence, mixed 12/06/2015   Gait instability 12/06/2015   Memory loss 05/03/2015   Allergic rhinitis 05/02/2015   HLD (hyperlipidemia) 05/02/2015   Essential hypertension  05/02/2015   Adult hypothyroidism 05/02/2015   Malaise and fatigue 05/02/2015   Mild mitral regurgitation by prior echocardiogram 05/02/2015   Obesity (BMI 35.0-39.9 without comorbidity) 05/02/2015   Osteoarthritis, multiple sites 05/02/2015   Anxiety 01/27/2015   Liver cirrhosis secondary to NASH (nonalcoholic steatohepatitis) (HCC) 01/12/2015   Hammer toe 11/18/2014   Hallux rigidus of right foot 11/18/2014   Hallux rigidus of left foot 11/18/2014   Chronic right shoulder pain 10/26/2014   Peripheral nerve disease 10/22/2012    Orientation RESPIRATION BLADDER Height & Weight     Self, Time, Situation, Place  Normal Incontinent Weight: 91.1 kg Height:  5\' 7"  (170.2 cm)  BEHAVIORAL SYMPTOMS/MOOD NEUROLOGICAL BOWEL NUTRITION STATUS      Continent Diet (see dc summary)  AMBULATORY STATUS COMMUNICATION OF NEEDS Skin   Extensive Assist Verbally Normal                       Personal Care Assistance Level of Assistance  Bathing, Feeding, Dressing Bathing Assistance: Limited assistance Feeding assistance: Limited assistance Dressing Assistance: Maximum assistance     Functional Limitations Info  Sight, Hearing, Speech Sight Info: Adequate Hearing Info: Impaired Speech Info: Adequate    SPECIAL CARE FACTORS FREQUENCY  PT (By licensed PT), OT (By licensed OT)     PT Frequency: 5 times per week OT Frequency: 4 rimes per weeek            Contractures Contractures Info: Not present    Additional Factors Info  Code Status, Allergies Code Status Info: full code Allergies Info: Epinephrine, Promethazine Hcl, Statins  Current Medications (08/12/2023):  This is the current hospital active medication list Current Facility-Administered Medications  Medication Dose Route Frequency Provider Last Rate Last Admin   acetaminophen (TYLENOL) tablet 650 mg  650 mg Oral Q6H PRN Lorretta Harp, MD       albuterol (PROVENTIL) (2.5 MG/3ML) 0.083% nebulizer solution 2.5 mg   2.5 mg Nebulization Q4H PRN Lorretta Harp, MD       ALPRAZolam Prudy Feeler) tablet 0.5 mg  0.5 mg Oral Daily PRN Lorretta Harp, MD   0.5 mg at 08/09/23 2243   aspirin EC tablet 81 mg  81 mg Oral Daily Lorretta Harp, MD   81 mg at 08/12/23 1043   dextromethorphan-guaiFENesin (MUCINEX DM) 30-600 MG per 12 hr tablet 1 tablet  1 tablet Oral BID PRN Lorretta Harp, MD   1 tablet at 08/09/23 2234   diphenhydrAMINE (BENADRYL) injection 12.5 mg  12.5 mg Intravenous Q8H PRN Lorretta Harp, MD       enoxaparin (LOVENOX) injection 45 mg  0.5 mg/kg Subcutaneous Q24H Lorretta Harp, MD   45 mg at 08/11/23 2157   feeding supplement (ENSURE ENLIVE / ENSURE PLUS) liquid 237 mL  237 mL Oral BID BM Lurene Shadow, MD   237 mL at 08/12/23 1043   furosemide (LASIX) tablet 40 mg  40 mg Oral Daily Lurene Shadow, MD   40 mg at 08/12/23 1043   hydrALAZINE (APRESOLINE) injection 5 mg  5 mg Intravenous Q2H PRN Lorretta Harp, MD   5 mg at 08/11/23 1625   lactulose (CHRONULAC) 10 GM/15ML solution 20 g  20 g Oral Raeanne Gathers, MD   20 g at 08/11/23 0950   levofloxacin (LEVAQUIN) tablet 750 mg  750 mg Oral Q48H Lurene Shadow, MD       levothyroxine (SYNTHROID) tablet 100 mcg  100 mcg Oral Q0600 Lorretta Harp, MD   100 mcg at 08/12/23 0544   lisinopril (ZESTRIL) tablet 5 mg  5 mg Oral Daily Lurene Shadow, MD   5 mg at 08/12/23 1043   memantine (NAMENDA) tablet 5 mg  5 mg Oral BID Lorretta Harp, MD   5 mg at 08/12/23 1043   morphine (PF) 2 MG/ML injection 2 mg  2 mg Intravenous Q4H PRN Lorretta Harp, MD       nadolol (CORGARD) tablet 20 mg  20 mg Oral QPM Lorretta Harp, MD   20 mg at 08/11/23 1815   Oral care mouth rinse  15 mL Mouth Rinse PRN Lurene Shadow, MD       oxyCODONE (Oxy IR/ROXICODONE) immediate release tablet 5 mg  5 mg Oral Q6H PRN Lorretta Harp, MD       PARoxetine (PAXIL) tablet 40 mg  40 mg Oral Daily Lorretta Harp, MD   40 mg at 08/12/23 1043   polyethylene glycol (MIRALAX / GLYCOLAX) packet 17 g  17 g Oral Daily PRN Lorretta Harp, MD       QUEtiapine  (SEROQUEL) tablet 25 mg  25 mg Oral QHS Lorretta Harp, MD   25 mg at 08/11/23 2157     Discharge Medications: Please see discharge summary for a list of discharge medications.  Relevant Imaging Results:  Relevant Lab Results:   Additional Information SS# [id number]  Marlowe Sax, RN

## 2023-08-12 NOTE — Discharge Summary (Signed)
Physician Discharge Summary   Patient: Anita Bennett MRN: [id number] DOB: 06/28/1941  Admit date:     08/08/2023  Discharge date: 08/12/23  Discharge Physician: Lurene Shadow   PCP: Wilford Corner, PA-C   Recommendations at discharge:   Follow-up with PCP in 1 week  Discharge Diagnoses: Principal Problem:   Severe sepsis (HCC) Active Problems:   E coli bacteremia   Acute pancreatitis   UTI (urinary tract infection)   Essential hypertension   Adult hypothyroidism   Liver cirrhosis secondary to NASH (nonalcoholic steatohepatitis) (HCC)   Chronic diastolic CHF (congestive heart failure) (HCC)   Hypokalemia   Hypophosphatemia   Acute metabolic encephalopathy   Chronic kidney disease, stage 3a (HCC)   COVID-19 virus infection   Anxiety   Obesity (BMI 35.0-39.9 without comorbidity)  Resolved Problems:   * No resolved hospital problems. *  Hospital Course:  Anita Bennett is a 83 y.o. female with medical history significant for hypertension, hyperlipidemia, chronic HFpEF, hypothyroidism, dementia, depression, anxiety, CKD stage IIIa, liver cirrhosis secondary to NAFLD, kidney stones, obesity.  She presented from SNF to the hospital because of altered mental status.  There was also report of nausea and vomiting.   She was seen by her PCP the day prior to admission and was found to have UTI.  She had been prescribed antibiotics but she had not started it yet.   She was hypoxic with oxygen saturation of 86% on room air and she tested positive for COVID-19 infection.  Lactic acid was elevated at 3.7 on admission.  BNP was elevated at 1,199.6.   Assessment and Plan:   Severe sepsis secondary to acute UTI and E. coli bacteremia: Blood culture showed E. coli.  She was initially treated with IV ceftriaxone.  She will be discharged on oral Levaquin to complete treatment on 08/17/2023.       Acute metabolic encephalopathy on underlying dementia: Improved.  Patient appears to  be at baseline. Daughter said patient was lethargic and could not even speak before admission.     Acute hypoxemic respiratory failure: Improved.       Nausea and vomiting: Improved.   Suspected mild acute pancreatitis on CT abdomen: No abdominal pain.  Lipase was only 87.  Significance of this finding on CT is unclear.     COVID-19 infection: Supportive care as needed. [name], patient's daughter, said patient had COVID-19 infection about 2 weeks prior to admission.     Hypokalemia, hypophosphatemia: Improved       Acute thrombocytopenia: Platelet count is improving.  May be related to sepsis.  Outpatient follow-up with PCP to monitor platelet count     Probable acute on chronic HFpEF: She had IV Lasix on admission.  Hypertension: BP is better.  Continue Lasix and lisinopril. 2D echo in August 2024 showed normal LV function with EF greater than 55%, moderate LVH, moderate mitral stenosis, mild aortic stenosis, mild AR, mild MR, mild TR.   Liver cirrhosis: Compensated.  Continue lactulose and nadolol     Comorbidities include hypertension, hypothyroidism, CKD stage IIIa, depression, anxiety    Her condition has improved and she is deemed stable for discharge to home today.  Discharge plan was discussed with Vernona Rieger, daughter, over the phone.      Consultants: None Procedures performed: None Disposition: Assisted living Diet recommendation:  Discharge Diet Orders (From admission, onward)     Start     Ordered   08/12/23 0000  Diet - low sodium heart healthy  08/12/23 1048           Cardiac diet DISCHARGE MEDICATION: Allergies as of 08/12/2023       Reactions   Epinephrine    Heart race   Promethazine Hcl    hyperactivity   Statins         Medication List     STOP taking these medications    ALPRAZolam 0.5 MG tablet Commonly known as: Xanax   cyanocobalamin 1000 MCG tablet Commonly known as: VITAMIN B12   diclofenac sodium 1 % Gel Commonly  known as: VOLTAREN   metoprolol succinate 25 MG 24 hr tablet Commonly known as: TOPROL-XL   MULTIPLE VITAMINS PO   Myrbetriq 50 MG Tb24 tablet Generic drug: mirabegron ER   nitrofurantoin (macrocrystal-monohydrate) 100 MG capsule Commonly known as: MACROBID   rifaximin 550 MG Tabs tablet Commonly known as: XIFAXAN       TAKE these medications    acetaminophen 500 MG tablet Commonly known as: TYLENOL Take 1 tablet (500 mg total) by mouth every 6 (six) hours as needed. What changed: how much to take   aspirin EC 81 MG tablet Take 1 tablet by mouth daily.   furosemide 20 MG tablet Commonly known as: LASIX Take 40 mg by mouth in the morning.   Lactulose 20 GM/30ML Soln Take 30 mLs (20 g total) by mouth daily. Take one dose every other day What changed:  how much to take how to take this when to take this   levofloxacin 750 MG tablet Commonly known as: LEVAQUIN Take 1 tablet (750 mg total) by mouth every other day for 2 doses. Start taking on: August 14, 2023   levothyroxine 100 MCG tablet Commonly known as: SYNTHROID Take 1 tablet (100 mcg total) by mouth daily.   lisinopril 5 MG tablet Commonly known as: ZESTRIL Take 5 mg by mouth daily.   memantine 5 MG tablet Commonly known as: NAMENDA Take 5 mg by mouth 2 (two) times daily.   nadolol 20 MG tablet Commonly known as: CORGARD Take 20 mg by mouth every evening.   oxyCODONE 5 MG immediate release tablet Commonly known as: Oxy IR/ROXICODONE oxycodone 5 mg tablet   PARoxetine 40 MG tablet Commonly known as: PAXIL Take 40 mg by mouth daily. What changed: Another medication with the same name was removed. Continue taking this medication, and follow the directions you see here.   polyethylene glycol 17 g packet Commonly known as: MIRALAX / GLYCOLAX Take 17 g by mouth daily as needed for moderate constipation. Self admin may have in room   QUEtiapine 25 MG tablet Commonly known as: SEROQUEL Take 25 mg  by mouth at bedtime.   Vitamin D-1000 Max St 25 MCG (1000 UT) tablet Generic drug: Cholecalciferol Take 1,000 Units by mouth daily.        Discharge Exam: Filed Weights   08/08/23 1739 08/10/23 1300 08/12/23 0500  Weight: 90.7 kg 92.4 kg 91.1 kg   GEN: NAD SKIN: Warm and dry EYES: No pallor or icterus ENT: MMM CV: RRR PULM: CTA B ABD: soft, ND, NT, +BS CNS: AAO x 2 (person and place), non focal EXT: No edema or tenderness   Condition at discharge: good  The results of significant diagnostics from this hospitalization (including imaging, microbiology, ancillary and laboratory) are listed below for reference.   Imaging Studies: CT ABDOMEN PELVIS W CONTRAST Result Date: 08/08/2023 CLINICAL DATA:  Altered mental status, UTI symptoms, vomiting EXAM: CT ABDOMEN AND PELVIS WITH CONTRAST TECHNIQUE:  Multidetector CT imaging of the abdomen and pelvis was performed using the standard protocol following bolus administration of intravenous contrast. RADIATION DOSE REDUCTION: This exam was performed according to the departmental dose-optimization program which includes automated exposure control, adjustment of the mA and/or kV according to patient size and/or use of iterative reconstruction technique. CONTRAST:  OMNIPAQUE IOHEXOL 300 MG/ML  SOLN COMPARISON:  None Available. FINDINGS: Lower chest: Trace bilateral pleural effusions. Mild bilateral lower lobe atelectasis. Hepatobiliary: Nodular hepatic contour, suggesting cirrhosis. No focal hepatic lesion is seen. Status post cholecystectomy. No intrahepatic or extrahepatic ductal dilatation. Pancreas: Mild peripancreatic stranding, including along the pancreatic tail (series 2/image 34), raising the possibility of mild acute pancreatitis. No pancreatic mass, atrophy, or drainable fluid collection/pseudocyst. Spleen: Mildly enlarged, measuring 15.2 cm in maximal craniocaudal dimension. Adrenals/Urinary Tract: Adrenal glands are within normal  limits. Kidneys are within normal limits. Mild fullness of the bilateral renal collecting system without frank hydronephrosis. Thick-walled bladder, although underdistended. Stomach/Bowel: Stomach is notable for a small hiatal hernia. No evidence of bowel obstruction. Normal appendix (series 2/image 88). No colonic wall thickening or inflammatory changes. Moderate rectal stool burden, suggesting mild fecal impaction, without associated wall thickening to suggest stercoral colitis. Vascular/Lymphatic: No evidence of abdominal aortic aneurysm. Atherosclerotic calcifications of the abdominal aorta and branch vessels, although vessels remain patent. No suspicious abdominopelvic lymphadenopathy. Reproductive: Status post hysterectomy. No adnexal masses. Other: No abdominopelvic ascites. Musculoskeletal: Degenerative changes of the visualized thoracolumbar spine. Moderate superior endplate compression fracture deformity at L2, likely chronic. IMPRESSION: Suspected mild acute pancreatitis. Thick-walled bladder, although underdistended. Correlate for cystitis. Cirrhosis with mild splenomegaly. Suspected mild rectal fecal impaction, without stercoral colitis. Trace bilateral pleural effusions. Electronically Signed   By: Charline Bills M.D.   On: 08/08/2023 21:39   CT Head Wo Contrast Result Date: 08/08/2023 CLINICAL DATA:  Altered mental status. EXAM: CT HEAD WITHOUT CONTRAST TECHNIQUE: Contiguous axial images were obtained from the base of the skull through the vertex without intravenous contrast. RADIATION DOSE REDUCTION: This exam was performed according to the departmental dose-optimization program which includes automated exposure control, adjustment of the mA and/or kV according to patient size and/or use of iterative reconstruction technique. COMPARISON:  None Available. FINDINGS: Brain: There is mild cerebral atrophy with widening of the extra-axial spaces and ventricular dilatation. There are areas of  decreased attenuation within the white matter tracts of the supratentorial brain, consistent with microvascular disease changes. Vascular: Moderate to marked severity bilateral cavernous carotid artery calcification is noted. Skull: Normal. Negative for fracture or focal lesion. Sinuses/Orbits: No acute finding. Other: None. IMPRESSION: 1. No acute intracranial abnormality. 2. Generalized cerebral atrophy with widening of the extra-axial spaces and ventricular dilatation. Electronically Signed   By: Aram Candela M.D.   On: 08/08/2023 19:07   DG Chest 1 View Result Date: 08/08/2023 CLINICAL DATA:  Altered mental status. EXAM: CHEST  1 VIEW COMPARISON:  April 30, 2021 FINDINGS: The cardiac silhouette is mildly enlarged and unchanged in size. Mild, diffuse, chronic appearing increased interstitial lung markings are seen. Mild, stable areas of linear scarring and/or atelectasis are seen within the mid right lung and left lung base. No pleural effusion or pneumothorax is identified. Multilevel degenerative changes are seen throughout the thoracic spine. IMPRESSION: Chronic appearing increased interstitial lung markings with mild, stable areas of linear scarring and/or atelectasis within the mid right lung and left lung base. Electronically Signed   By: Aram Candela M.D.   On: 08/08/2023 19:06    Microbiology: Results  for orders placed or performed during the hospital encounter of 08/08/23  Resp panel by RT-PCR (RSV, Flu A&B, Covid) Anterior Nasal Swab     Status: Abnormal   Collection Time: 08/08/23  5:44 PM   Specimen: Anterior Nasal Swab  Result Value Ref Range Status   SARS Coronavirus 2 by RT PCR POSITIVE (A) NEGATIVE Final    Comment: (NOTE) SARS-CoV-2 target nucleic acids are DETECTED.  The SARS-CoV-2 RNA is generally detectable in upper respiratory specimens during the acute phase of infection. Positive results are indicative of the presence of the identified virus, but do not  rule out bacterial infection or co-infection with other pathogens not detected by the test. Clinical correlation with patient history and other diagnostic information is necessary to determine patient infection status. The expected result is Negative.  Fact Sheet for Patients: BloggerCourse.com  Fact Sheet for Healthcare Providers: SeriousBroker.it  This test is not yet approved or cleared by the [place] FDA and  has been authorized for detection and/or diagnosis of SARS-CoV-2 by FDA under an Emergency Use Authorization (EUA).  This EUA will remain in effect (meaning this test can be used) for the duration of  the COVID-19 declaration under Section 564(b)(1) of the A ct, 21 U.S.C. section 360bbb-3(b)(1), unless the authorization is terminated or revoked sooner.     Influenza A by PCR NEGATIVE NEGATIVE Final   Influenza B by PCR NEGATIVE NEGATIVE Final    Comment: (NOTE) The Xpert Xpress SARS-CoV-2/FLU/RSV plus assay is intended as an aid in the diagnosis of influenza from Nasopharyngeal swab specimens and should not be used as a sole basis for treatment. Nasal washings and aspirates are unacceptable for Xpert Xpress SARS-CoV-2/FLU/RSV testing.  Fact Sheet for Patients: BloggerCourse.com  Fact Sheet for Healthcare Providers: SeriousBroker.it  This test is not yet approved or cleared by the [place] FDA and has been authorized for detection and/or diagnosis of SARS-CoV-2 by FDA under an Emergency Use Authorization (EUA). This EUA will remain in effect (meaning this test can be used) for the duration of the COVID-19 declaration under Section 564(b)(1) of the Act, 21 U.S.C. section 360bbb-3(b)(1), unless the authorization is terminated or revoked.     Resp Syncytial Virus by PCR NEGATIVE NEGATIVE Final    Comment: (NOTE) Fact Sheet for  Patients: BloggerCourse.com  Fact Sheet for Healthcare Providers: SeriousBroker.it  This test is not yet approved or cleared by the [place] FDA and has been authorized for detection and/or diagnosis of SARS-CoV-2 by FDA under an Emergency Use Authorization (EUA). This EUA will remain in effect (meaning this test can be used) for the duration of the COVID-19 declaration under Section 564(b)(1) of the Act, 21 U.S.C. section 360bbb-3(b)(1), unless the authorization is terminated or revoked.  Performed at [facility], [address] [place], [place] [address]   Culture, blood (routine x 2)     Status: Abnormal   Collection Time: 08/08/23  6:03 PM   Specimen: BLOOD  Result Value Ref Range Status   Specimen Description   Final    BLOOD BLOOD RIGHT FOREARM Performed at [facility], [address] [place], [place] [address]    Special Requests   Final    BOTTLES DRAWN AEROBIC AND ANAEROBIC Blood Culture results may not be optimal due to an inadequate volume of blood received in culture bottles Performed at [facility], [address], [place], [place] [address]    Culture  Setup Time   Final  GRAM NEGATIVE RODS IN BOTH AEROBIC AND ANAEROBIC BOTTLES CRITICAL RESULT CALLED TO, READ BACK BY AND VERIFIED WITH: RAQUEL RODRIGUEZ GUZMAN AT 0710 08/09/23.PMF Performed at [facility], [address], [place], [place] [address]    Culture ESCHERICHIA COLI (A)  Final   Report Status 08/11/2023 FINAL  Final   Organism ID, Bacteria ESCHERICHIA COLI  Final   Organism ID, Bacteria ESCHERICHIA COLI  Final      Susceptibility   Escherichia coli - KIRBY BAUER*    CEFAZOLIN INTERMEDIATE Intermediate    Escherichia coli - MIC*    AMPICILLIN >=32 RESISTANT Resistant     CEFEPIME <=0.12 SENSITIVE Sensitive     CEFTAZIDIME <=1 SENSITIVE Sensitive     CEFTRIAXONE <=0.25 SENSITIVE Sensitive      CIPROFLOXACIN <=0.25 SENSITIVE Sensitive     GENTAMICIN <=1 SENSITIVE Sensitive     IMIPENEM <=0.25 SENSITIVE Sensitive     TRIMETH/SULFA >=320 RESISTANT Resistant     AMPICILLIN/SULBACTAM 16 INTERMEDIATE Intermediate     PIP/TAZO <=4 SENSITIVE Sensitive ug/mL    * ESCHERICHIA COLI    ESCHERICHIA COLI  Blood Culture ID Panel (Reflexed)     Status: Abnormal   Collection Time: 08/08/23  6:03 PM  Result Value Ref Range Status   Enterococcus faecalis NOT DETECTED NOT DETECTED Final   Enterococcus Faecium NOT DETECTED NOT DETECTED Final   Listeria monocytogenes NOT DETECTED NOT DETECTED Final   Staphylococcus species NOT DETECTED NOT DETECTED Final   Staphylococcus aureus (BCID) NOT DETECTED NOT DETECTED Final   Staphylococcus epidermidis NOT DETECTED NOT DETECTED Final   Staphylococcus lugdunensis NOT DETECTED NOT DETECTED Final   Streptococcus species NOT DETECTED NOT DETECTED Final   Streptococcus agalactiae NOT DETECTED NOT DETECTED Final   Streptococcus pneumoniae NOT DETECTED NOT DETECTED Final   Streptococcus pyogenes NOT DETECTED NOT DETECTED Final   A.calcoaceticus-baumannii NOT DETECTED NOT DETECTED Final   Bacteroides fragilis NOT DETECTED NOT DETECTED Final   Enterobacterales DETECTED (A) NOT DETECTED Final    Comment: Enterobacterales represent a large order of gram negative bacteria, not a single organism. CRITICAL RESULT CALLED TO, READ BACK BY AND VERIFIED WITH: RAQUEL RODRIGUEZ GUZMAN AT 0710 08/09/23.PMF    Enterobacter cloacae complex NOT DETECTED NOT DETECTED Final   Escherichia coli DETECTED (A) NOT DETECTED Final    Comment: CRITICAL RESULT CALLED TO, READ BACK BY AND VERIFIED WITH: RAQUEL RODRIGUEZ GUZMAN AT 0710 08/09/23.PMF    Klebsiella aerogenes NOT DETECTED NOT DETECTED Final   Klebsiella oxytoca NOT DETECTED NOT DETECTED Final   Klebsiella pneumoniae NOT DETECTED NOT DETECTED Final   Proteus species NOT DETECTED NOT DETECTED Final   Salmonella species NOT  DETECTED NOT DETECTED Final   Serratia marcescens NOT DETECTED NOT DETECTED Final   Haemophilus influenzae NOT DETECTED NOT DETECTED Final   Neisseria meningitidis NOT DETECTED NOT DETECTED Final   Pseudomonas aeruginosa NOT DETECTED NOT DETECTED Final   Stenotrophomonas maltophilia NOT DETECTED NOT DETECTED Final   Candida albicans NOT DETECTED NOT DETECTED Final   Candida auris NOT DETECTED NOT DETECTED Final   Candida glabrata NOT DETECTED NOT DETECTED Final   Candida krusei NOT DETECTED NOT DETECTED Final   Candida parapsilosis NOT DETECTED NOT DETECTED Final   Candida tropicalis NOT DETECTED NOT DETECTED Final   Cryptococcus neoformans/gattii NOT DETECTED NOT DETECTED Final   CTX-M ESBL NOT DETECTED NOT DETECTED Final   Carbapenem resistance IMP NOT DETECTED NOT DETECTED Final   Carbapenem resistance KPC NOT DETECTED NOT DETECTED Final  Carbapenem resistance NDM NOT DETECTED NOT DETECTED Final   Carbapenem resist OXA 48 LIKE NOT DETECTED NOT DETECTED Final   Carbapenem resistance VIM NOT DETECTED NOT DETECTED Final    Comment: Performed at [facility], [address] [place], [place] [address]  Culture, blood (routine x 2)     Status: Abnormal   Collection Time: 08/08/23  8:25 PM   Specimen: BLOOD  Result Value Ref Range Status   Specimen Description BLOOD BLOOD LEFT HAND  Final   Special Requests   Final    BOTTLES DRAWN AEROBIC AND ANAEROBIC Blood Culture adequate volume   Culture  Setup Time   Final    GRAM NEGATIVE RODS IN BOTH AEROBIC AND ANAEROBIC BOTTLES CRITICAL RESULT CALLED TO, READ BACK BY AND VERIFIED WITH: RAQUEL RODRIGUEZ GUZMAN AT 0710 08/09/23.PMF    Culture (A)  Final    ESCHERICHIA COLI SUSCEPTIBILITIES PERFORMED ON PREVIOUS CULTURE WITHIN THE LAST 5 DAYS.    Report Status 08/11/2023 FINAL  Final    Labs: CBC: Recent Labs  Lab 08/08/23 1744 08/09/23 0554 08/10/23 0639 08/11/23 0333  WBC 5.8 8.0 7.0 4.9  NEUTROABS 5.3  --  5.5  --    HGB 10.1* 8.4* 8.4* 9.1*  HCT 31.7* 26.9* 27.2* 28.5*  MCV 87.8 90.9 91.0 89.6  PLT 137* 93* 105* 117*   Basic Metabolic Panel: Recent Labs  Lab 08/08/23 1744 08/09/23 0554 08/10/23 0639 08/11/23 0333 08/12/23 0425  NA 139 141 138 142 140  K 3.2* 3.3* 3.3* 4.0 3.6  CL 103 108 105 108 106  CO2 19* 23 25 25 25   GLUCOSE 111* 147* 99 85 92  BUN 14 17 35* 35* 25*  CREATININE 1.10* 1.10* 1.22* 1.17* 1.02*  CALCIUM 8.7* 8.1* 8.2* 8.4* 8.4*  MG 1.8  --  2.6* 2.3 2.3  PHOS 1.3*  --  3.4 2.6 3.4   Liver Function Tests: Recent Labs  Lab 08/08/23 1744 08/09/23 0554 08/12/23 0425  AST 40 36  --   ALT 18 16  --   ALKPHOS 78 35*  --   BILITOT 3.1* 1.9*  --   PROT 7.1 5.9*  --   ALBUMIN 3.8 3.0* 3.0*   CBG: Recent Labs  Lab 08/10/23 0701 08/10/23 0744 08/10/23 1155 08/10/23 1308  GLUCAP 98 88 78 78    Discharge time spent: greater than 30 minutes.  Signed: Lurene Shadow, MD [facility] 08/12/2023

## 2023-08-12 NOTE — TOC Progression Note (Signed)
Transition of Care [facility]) - Progression Note    Patient Details  Name: Anita Bennett MRN: [id number] Date of Birth: 11-13-1940  [facility]) CM/SW Contact  Marlowe Sax, RN Phone Number: 08/12/2023, 3:38 PM  Clinical Narrative:    Re[facility] out [facility] to [facility] and spoke with [name], she stated that the patient will have to be assessed before they will agree to take her back, they will not send anyone to the hospital to assess her until sometime tomorrow   Expected Discharge Plan: Skilled Nursing Facility Barriers to Discharge: SNF Pending bed offer  Expected Discharge Plan and Services   Discharge Planning Services: CM Consult   Living arrangements for the past 2 months: Assisted Living Facility Expected Discharge Date: 08/12/23                 DME Agency: NA       HH Arranged: NA           Social Determinants of Health (SDOH) Interventions SDOH Screenings   Food Insecurity: No Food Insecurity (08/10/2023)  Housing: Low Risk  (08/10/2023)  Transportation Needs: No Transportation Needs (08/10/2023)  Utilities: Not At Risk (08/10/2023)  Social Connections: Moderately Integrated (08/10/2023)  Tobacco Use: Low Risk  (08/08/2023)    Readmission Risk Interventions     No data to display

## 2023-08-12 NOTE — Care Management Important Message (Signed)
Important Message  Patient Details  Name: Anita Bennett MRN: [id number] Date of Birth: 09-14-1940   Important Message Given:  Yes - Medicare IM     Cristela Blue, CMA 08/12/2023, 10:27 AM

## 2023-08-12 NOTE — Progress Notes (Signed)
.  PHARMACY NOTE:  ANTIMICROBIAL RENAL DOSAGE ADJUSTMENT  Current antimicrobial regimen includes a mismatch between antimicrobial dosage and estimated renal function.  As per policy approved by the Pharmacy & Therapeutics and Medical Executive Committees, the antimicrobial dosage will be adjusted accordingly.  Current antimicrobial dosage:  Levofloxacin 500 mg PO daily  Indication: Escherichia coli bacteremia  Renal Function:  Estimated Creatinine Clearance: 47.6 mL/min (A) (by C-G formula based on SCr of 1.02 mg/dL (H)). []      On intermittent HD, scheduled: []      On CRRT    Antimicrobial dosage has been changed to:  Levofloxacin 750 mg PO q48H  Additional comments:   Thank you for allowing pharmacy to be a part of this patient's care.  Will M. Dareen Piano, PharmD Clinical Pharmacist 08/12/2023 7:55 AM

## 2023-08-12 NOTE — TOC Progression Note (Signed)
Transition of Care [facility]) - Progression Note    Patient Details  Name: Anita Bennett MRN: [id number] Date of Birth: 1941/01/14  [facility]) CM/SW Contact  Marlowe Sax, RN Phone Number: 08/12/2023, 12:03 PM  Clinical Narrative:     The patient lives in ALF at [facility], I explained to her that the recommendation is to go to STR, she said she prefers to [facility] to [facility], I explained that [facility] requires for her to go to STR before returning to [facility] , she stated agreement I called and left a general VM for her daughter Vernona Rieger, awaiting a bed offer  Expected Discharge Plan: Skilled Nursing Facility Barriers to Discharge: SNF Pending bed offer  Expected Discharge Plan and Services   Discharge Planning Services: CM Consult   Living arrangements for the past 2 months: Assisted Living Facility Expected Discharge Date: 08/12/23                 DME Agency: NA       HH Arranged: NA           Social Determinants of Health (SDOH) Interventions SDOH Screenings   Food Insecurity: No Food Insecurity (08/10/2023)  Housing: Low Risk  (08/10/2023)  Transportation Needs: No Transportation Needs (08/10/2023)  Utilities: Not At Risk (08/10/2023)  Social Connections: Moderately Integrated (08/10/2023)  Tobacco Use: Low Risk  (08/08/2023)    Readmission Risk Interventions     No data to display

## 2023-08-12 NOTE — TOC Progression Note (Signed)
Transition of Care [facility]) - Progression Note    Patient Details  Name: Anita Bennett MRN: [id number] Date of Birth: 11-22-1940  Transition of Care [facility]) CM/SW Contact  Marlowe Sax, RN Phone Number: 08/12/2023, 3:14 PM  Clinical Narrative:     [facility], left another VM asking for a call back, the patient is wanting to return to [facility]  Expected Discharge Plan: Skilled Nursing Facility Barriers to Discharge: SNF Pending bed offer  Expected Discharge Plan and Services   Discharge Planning Services: CM Consult   Living arrangements for the past 2 months: Assisted Living Facility Expected Discharge Date: 08/12/23                 DME Agency: NA       HH Arranged: NA           Social Determinants of Health (SDOH) Interventions SDOH Screenings   Food Insecurity: No Food Insecurity (08/10/2023)  Housing: Low Risk  (08/10/2023)  Transportation Needs: No Transportation Needs (08/10/2023)  Utilities: Not At Risk (08/10/2023)  Social Connections: Moderately Integrated (08/10/2023)  Tobacco Use: Low Risk  (08/08/2023)    Readmission Risk Interventions     No data to display

## 2023-08-12 NOTE — [place] FL2 (Signed)
[place] MEDICAID FL2 LEVEL OF CARE FORM     IDENTIFICATION  Patient Name: Anita Bennett Birthdate: 1941/05/10 Sex: female Admission Date (Current Location): 08/08/2023  [facility] and [place] Number:  Chiropodist and Address:  [facility], [address], [place], [place] [address]      Provider Number: [id number]  Attending Physician Name and Address:  Lurene Shadow, MD  Relative Name and Phone Number:  Jim Desanctis  Daughter  Emergency Contact  [phone]    Current Level of Care: Hospital Recommended Level of Care: Assisted Living Facility Prior Approval Number:    Date Approved/Denied:   PASRR Number: [id number]  Discharge Plan: SNF    Current Diagnoses: Patient Active Problem List   Diagnosis Date Noted   Acute metabolic encephalopathy 08/09/2023   Chronic diastolic CHF (congestive heart failure) (HCC) 08/09/2023   Severe sepsis (HCC) 08/09/2023   E coli bacteremia 08/09/2023   COVID-19 virus infection 08/08/2023   Acute pancreatitis 08/08/2023   UTI (urinary tract infection) 08/08/2023   Hypokalemia 08/08/2023   Chronic kidney disease, stage 3a (HCC) 08/08/2023   Hypophosphatemia 08/08/2023   Acquired hallux rigidus 08/01/2018   Enthesopathy of right hip 04/03/2018   Closed transverse fracture of patella with malunion, left 01/21/2018   Posterior tibial tendinitis of left leg 01/21/2018   S/P left unicompartmental knee replacement 12/19/2017   H/O iron deficiency anemia 05/29/2017   Impaired glucose tolerance 08/14/2016   Acute cystitis without hematuria 07/04/2016   Incomplete emptying of bladder 02/02/2016   Urinary hesitancy 02/02/2016   Increased frequency of urination 01/31/2016   Aortic heart murmur on examination 12/06/2015   Urinary incontinence, mixed 12/06/2015   Gait instability 12/06/2015   Memory loss 05/03/2015   Allergic rhinitis 05/02/2015   HLD (hyperlipidemia) 05/02/2015   Essential hypertension  05/02/2015   Adult hypothyroidism 05/02/2015   Malaise and fatigue 05/02/2015   Mild mitral regurgitation by prior echocardiogram 05/02/2015   Obesity (BMI 35.0-39.9 without comorbidity) 05/02/2015   Osteoarthritis, multiple sites 05/02/2015   Anxiety 01/27/2015   Liver cirrhosis secondary to NASH (nonalcoholic steatohepatitis) (HCC) 01/12/2015   Hammer toe 11/18/2014   Hallux rigidus of right foot 11/18/2014   Hallux rigidus of left foot 11/18/2014   Chronic right shoulder pain 10/26/2014   Peripheral nerve disease 10/22/2012    Orientation RESPIRATION BLADDER Height & Weight     Self, Time, Situation, Place  Normal Incontinent Weight: 91.1 kg Height:  5\' 7"  (170.2 cm)  BEHAVIORAL SYMPTOMS/MOOD NEUROLOGICAL BOWEL NUTRITION STATUS      Continent Diet (see dc summary)  AMBULATORY STATUS COMMUNICATION OF NEEDS Skin   Extensive Assist Verbally Normal                       Personal Care Assistance Level of Assistance  Bathing, Feeding, Dressing Bathing Assistance: Limited assistance Feeding assistance: Limited assistance Dressing Assistance: Maximum assistance     Functional Limitations Info  Sight, Hearing, Speech Sight Info: Adequate Hearing Info: Impaired Speech Info: Adequate    SPECIAL CARE FACTORS FREQUENCY  PT (By licensed PT), OT (By licensed OT)     PT Frequency: 2 times per week OT Frequency: 2 times per week            Contractures Contractures Info: Not present    Additional Factors Info  Code Status, Allergies Code Status Info: full code Allergies Info: Epinephrine, Promethazine Hcl, Statins  Current Medications (08/12/2023):  This is the current hospital active medication list Current Facility-Administered Medications  Medication Dose Route Frequency Provider Last Rate Last Admin   acetaminophen (TYLENOL) tablet 650 mg  650 mg Oral Q6H PRN Lorretta Harp, MD       albuterol (PROVENTIL) (2.5 MG/3ML) 0.083% nebulizer solution 2.5 mg   2.5 mg Nebulization Q4H PRN Lorretta Harp, MD       ALPRAZolam Prudy Feeler) tablet 0.5 mg  0.5 mg Oral Daily PRN Lorretta Harp, MD   0.5 mg at 08/09/23 2243   aspirin EC tablet 81 mg  81 mg Oral Daily Lorretta Harp, MD   81 mg at 08/12/23 1043   dextromethorphan-guaiFENesin (MUCINEX DM) 30-600 MG per 12 hr tablet 1 tablet  1 tablet Oral BID PRN Lorretta Harp, MD   1 tablet at 08/09/23 2234   diphenhydrAMINE (BENADRYL) injection 12.5 mg  12.5 mg Intravenous Q8H PRN Lorretta Harp, MD       enoxaparin (LOVENOX) injection 45 mg  0.5 mg/kg Subcutaneous Q24H Lorretta Harp, MD   45 mg at 08/11/23 2157   feeding supplement (ENSURE ENLIVE / ENSURE PLUS) liquid 237 mL  237 mL Oral BID BM Lurene Shadow, MD   237 mL at 08/12/23 1043   furosemide (LASIX) tablet 40 mg  40 mg Oral Daily Lurene Shadow, MD   40 mg at 08/12/23 1043   hydrALAZINE (APRESOLINE) injection 5 mg  5 mg Intravenous Q2H PRN Lorretta Harp, MD   5 mg at 08/11/23 1625   lactulose (CHRONULAC) 10 GM/15ML solution 20 g  20 g Oral Raeanne Gathers, MD   20 g at 08/11/23 0950   levofloxacin (LEVAQUIN) tablet 750 mg  750 mg Oral Q48H Lurene Shadow, MD       levothyroxine (SYNTHROID) tablet 100 mcg  100 mcg Oral Q0600 Lorretta Harp, MD   100 mcg at 08/12/23 0544   lisinopril (ZESTRIL) tablet 5 mg  5 mg Oral Daily Lurene Shadow, MD   5 mg at 08/12/23 1043   memantine (NAMENDA) tablet 5 mg  5 mg Oral BID Lorretta Harp, MD   5 mg at 08/12/23 1043   morphine (PF) 2 MG/ML injection 2 mg  2 mg Intravenous Q4H PRN Lorretta Harp, MD       nadolol (CORGARD) tablet 20 mg  20 mg Oral QPM Lorretta Harp, MD   20 mg at 08/11/23 1815   Oral care mouth rinse  15 mL Mouth Rinse PRN Lurene Shadow, MD       oxyCODONE (Oxy IR/ROXICODONE) immediate release tablet 5 mg  5 mg Oral Q6H PRN Lorretta Harp, MD       PARoxetine (PAXIL) tablet 40 mg  40 mg Oral Daily Lorretta Harp, MD   40 mg at 08/12/23 1043   polyethylene glycol (MIRALAX / GLYCOLAX) packet 17 g  17 g Oral Daily PRN Lorretta Harp, MD       QUEtiapine  (SEROQUEL) tablet 25 mg  25 mg Oral QHS Lorretta Harp, MD   25 mg at 08/11/23 2157     Discharge Medications: Please see discharge summary for a list of discharge medications.  Relevant Imaging Results:  Relevant Lab Results:   Additional Information SS# [id number]  Marlowe Sax, RN

## 2023-08-12 NOTE — Progress Notes (Signed)
PHARMACY CONSULT NOTE - ELECTROLYTES  Pharmacy Consult for Electrolyte Monitoring and Replacement   Recent Labs: Height: 5\' 7"  (170.2 cm) Weight: 91.1 kg (200 lb 13.4 oz) IBW/kg (Calculated) : 61.6 Estimated Creatinine Clearance: 47.6 mL/min (A) (by C-G formula based on SCr of 1.02 mg/dL (H)). Potassium (mmol/L)  Date Value  08/12/2023 3.6  08/29/2011 4.8   Magnesium (mg/dL)  Date Value  69/62/9528 2.3   Calcium  Date Value  08/12/2023 8.4 mg/dL (L)  41/32/4401 8.9   Albumin  Date Value  08/12/2023 3.0 g/dL (L)  02/72/5366 3.8   Phosphorus (mg/dL)  Date Value  44/09/4740 3.4   Sodium  Date Value  08/12/2023 140 mmol/L  11/29/2017 138  08/29/2011 139 mmol/L   Corrected Ca: 9.2 mg/dL  Assessment  Anita Bennett is a 83 y.o. female presenting with AMS, bacteremia. PMH significant for HTN, HLD, dCHF, hypothyroidism, dementia, depression with anxiety, CKD-3A, liver cirrhosis secondary to NAFLD, kidney stone, obesity . Pharmacy has been consulted to monitor and replace electrolytes.  Diet: heart  MIVF: none Pertinent medications: lasix 40 po daily, lactulose QOD  Goal of Therapy: Electrolytes WNL  Plan:  No electrolyte replacement at this time Check BMP, Phos with AM labs  Thank you for allowing pharmacy to be a part of this patient's care.  Bettey Costa, PharmD Clinical Pharmacist 08/12/2023 7:23 AM

## 2023-08-13 DIAGNOSIS — A419 Sepsis, unspecified organism: Secondary | ICD-10-CM | POA: Diagnosis not present

## 2023-08-13 DIAGNOSIS — R6520 Severe sepsis without septic shock: Secondary | ICD-10-CM | POA: Diagnosis not present

## 2023-08-13 LAB — BASIC METABOLIC PANEL
Anion gap: 9 (ref 5–15)
BUN: 21 mg/dL (ref 8–23)
CO2: 26 mmol/L (ref 22–32)
Calcium: 8.4 mg/dL — ABNORMAL LOW (ref 8.9–10.3)
Chloride: 103 mmol/L (ref 98–111)
Creatinine, Ser: 1.1 mg/dL — ABNORMAL HIGH (ref 0.44–1.00)
GFR, Estimated: 50 mL/min — ABNORMAL LOW (ref >60)
Glucose, Bld: 99 mg/dL (ref 70–99)
Potassium: 3.6 mmol/L (ref 3.5–5.1)
Sodium: 138 mmol/L (ref 135–145)

## 2023-08-13 LAB — MAGNESIUM: Magnesium: 2.3 mg/dL (ref 1.7–2.4)

## 2023-08-13 LAB — PHOSPHORUS: Phosphorus: 3.6 mg/dL (ref 2.5–4.6)

## 2023-08-13 NOTE — Plan of Care (Signed)
  Problem: Education: Goal: Knowledge of risk factors and measures for prevention of condition will improve Outcome: Progressing   Problem: Education: Goal: Knowledge of General Education information will improve Description: Including pain rating scale, medication(s)/side effects and non-pharmacologic comfort measures Outcome: Progressing   Problem: Activity: Goal: Risk for activity intolerance will decrease Outcome: Progressing   Problem: Nutrition: Goal: Adequate nutrition will be maintained Outcome: Progressing   Problem: Safety: Goal: Ability to remain free from injury will improve Outcome: Progressing

## 2023-08-13 NOTE — TOC Progression Note (Signed)
Transition of Care [facility]) - Progression Note    Patient Details  Name: Anita Bennett MRN: [id number] Date of Birth: 1940-11-30  [facility]) CM/SW Contact  Marlowe Sax, RN Phone Number: 08/13/2023, 12:55 PM  Clinical Narrative:     Sent fl2 and clinical to [facility], talked with the daughter and she is going to speak with the nurseabout coming to assess the patient to return to [facility], PT and OT see her daily there  Expected Discharge Plan: Skilled Nursing Facility Barriers to Discharge: SNF Pending bed offer  Expected Discharge Plan and Services   Discharge Planning Services: CM Consult   Living arrangements for the past 2 months: Assisted Living Facility Expected Discharge Date: 08/12/23                 DME Agency: NA       HH Arranged: NA           Social Determinants of Health (SDOH) Interventions SDOH Screenings   Food Insecurity: No Food Insecurity (08/10/2023)  Housing: Low Risk  (08/10/2023)  Transportation Needs: No Transportation Needs (08/10/2023)  Utilities: Not At Risk (08/10/2023)  Social Connections: Moderately Integrated (08/10/2023)  Tobacco Use: Low Risk  (08/08/2023)    Readmission Risk Interventions     No data to display

## 2023-08-13 NOTE — TOC Progression Note (Signed)
Transition of Care [facility]) - Progression Note    Patient Details  Name: Anita Bennett MRN: [id number] Date of Birth: December 30, 1940  Transition of Care [facility]) CM/SW Contact  Marlowe Sax, RN Phone Number: 08/13/2023, 3:07 PM  Clinical Narrative:    [facility] nurse called and said they will come in the morning to assess the patent to determine if they could accept her back   Expected Discharge Plan: Skilled Nursing Facility Barriers to Discharge: SNF Pending bed offer  Expected Discharge Plan and Services   Discharge Planning Services: CM Consult   Living arrangements for the past 2 months: Assisted Living Facility Expected Discharge Date: 08/12/23                 DME Agency: NA       HH Arranged: NA           Social Determinants of Health (SDOH) Interventions SDOH Screenings   Food Insecurity: No Food Insecurity (08/10/2023)  Housing: Low Risk  (08/10/2023)  Transportation Needs: No Transportation Needs (08/10/2023)  Utilities: Not At Risk (08/10/2023)  Social Connections: Moderately Integrated (08/10/2023)  Tobacco Use: Low Risk  (08/08/2023)    Readmission Risk Interventions     No data to display

## 2023-08-13 NOTE — Plan of Care (Signed)

## 2023-08-13 NOTE — Progress Notes (Signed)
Progress Note    Anita Bennett  MVH:[id number] DOB: [birth year]  DOA: 08/08/2023 PCP: Wilford Corner, PA-C      Brief Narrative:    Medical records reviewed and are as summarized below:  Anita Bennett is a 83 y.o. female with medical history significant for hypertension, hyperlipidemia, chronic HFpEF, hypothyroidism, dementia, depression, anxiety, CKD stage IIIa, liver cirrhosis secondary to NAFLD, kidney stones, obesity.  She presented from SNF to the hospital because of altered mental status.  There was also report of nausea and vomiting.  She was seen by her PCP the day prior to admission and was found to have UTI.  She had been prescribed antibiotics but she had not started it yet.  She was hypoxic with oxygen saturation of 86% on room air and she tested positive for COVID-19 infection.  Lactic acid was elevated at 3.7 on admission.  BNP was elevated at 1,199.6.   Assessment/Plan:   Principal Problem:   Severe sepsis (HCC) Active Problems:   E coli bacteremia   Acute pancreatitis   UTI (urinary tract infection)   Essential hypertension   Adult hypothyroidism   Liver cirrhosis secondary to NASH (nonalcoholic steatohepatitis) (HCC)   Chronic diastolic CHF (congestive heart failure) (HCC)   Hypokalemia   Hypophosphatemia   Acute metabolic encephalopathy   Chronic kidney disease, stage 3a (HCC)   COVID-19 virus infection   Anxiety   Obesity (BMI 35.0-39.9 without comorbidity)    Body mass index is 31.46 kg/m.  (Obesity)   Severe sepsis secondary to acute UTI and E. coli bacteremia: Blood culture showed E. coli.  She was initially treated with IV ceftriaxone.  Continue Levaquin through 08/17/2023     Acute metabolic encephalopathy on underlying dementia: Improved.  Patient appears to be at baseline. Daughter said patient was lethargic and could not even speak before admission.     Acute hypoxemic respiratory failure: Improved.       Nausea and  vomiting: Improved.   Suspected mild acute pancreatitis on CT abdomen: No abdominal pain.  Lipase was only 87.  Significance of this finding on CT is unclear.     COVID-19 infection: Supportive care as needed. [name], patient's daughter, said patient had COVID-19 infection about 2 weeks prior to admission.     Hypokalemia, hypophosphatemia: Improved       Acute thrombocytopenia: Platelet count is improving.  May be related to sepsis.  Outpatient follow-up with PCP to monitor platelet count     Probable acute on chronic HFpEF: She had IV Lasix on admission.  Hypertension: BP is better.  Continue Lasix and lisinopril. 2D echo in August 2024 showed normal LV function with EF greater than 55%, moderate LVH, moderate mitral stenosis, mild aortic stenosis, mild AR, mild MR, mild TR.     Liver cirrhosis: Compensated.  Continue lactulose and nadolol     Comorbidities include hypertension, hypothyroidism, CKD stage IIIa, depression, anxiety     Medically stable for discharge but awaiting placement.  ALF representative wants to assess patient to make sure she is eligible to go back to ALF.  Follow-up with transition of care team to assist with disposition    Diet Order             Diet 2 gram sodium Fluid consistency: Thin  Diet effective now           Diet - low sodium heart healthy  Consultants: None  Procedures: None    Medications:    aspirin EC  81 mg Oral Daily   enoxaparin (LOVENOX) injection  0.5 mg/kg Subcutaneous Q24H   feeding supplement  237 mL Oral BID BM   furosemide  40 mg Oral Daily   lactulose  20 g Oral QODAY   levofloxacin  750 mg Oral Q48H   levothyroxine  100 mcg Oral Q0600   lisinopril  5 mg Oral Daily   memantine  5 mg Oral BID   nadolol  20 mg Oral QPM   PARoxetine  40 mg Oral Daily   QUEtiapine  25 mg Oral QHS   Continuous Infusions:     Anti-infectives (From admission, onward)    Start      Dose/Rate Route Frequency Ordered Stop   08/14/23 0000  levofloxacin (LEVAQUIN) 750 MG tablet        750 mg Oral Every 48 hours 08/12/23 1048 08/17/23 2359   08/12/23 2200  levofloxacin (LEVAQUIN) tablet 750 mg        750 mg Oral Every 48 hours 08/12/23 0732 08/18/23 2159   08/09/23 2000  cefTRIAXone (ROCEPHIN) 2 g in sodium chloride 0.9 % 100 mL IVPB  Status:  Discontinued        2 g 200 mL/hr over 30 Minutes Intravenous Every 24 hours 08/08/23 2233 08/12/23 0732   08/08/23 2000  cefTRIAXone (ROCEPHIN) 2 g in sodium chloride 0.9 % 100 mL IVPB        2 g 200 mL/hr over 30 Minutes Intravenous Once 08/08/23 1952 08/08/23 2109   08/08/23 2000  metroNIDAZOLE (FLAGYL) IVPB 500 mg        500 mg 100 mL/hr over 60 Minutes Intravenous  Once 08/08/23 1956 08/08/23 2152              Family Communication/Anticipated D/C date and plan/Code Status   DVT prophylaxis:      Code Status: Full Code  Family Communication: None Disposition Plan: Plan to discharge to SNF versus ALF   Status is: Inpatient Remains inpatient appropriate because: Sepsis and bacteremia       Subjective:   No complaints.  She said she wants to go home.  She feels better.  Objective:    Vitals:   08/13/23 0336 08/13/23 0436 08/13/23 0832 08/13/23 1500  BP:  (!) 152/74 (!) 172/80 (!) 157/66  Pulse:  62 (!) 59 61  Resp:  18 16 18   Temp:  98.7 F (37.1 C) (!) 97.5 F (36.4 C) 99.1 F (37.3 C)  TempSrc:  Oral  Oral  SpO2:  98% 100% 99%  Weight: 91.1 kg     Height:       No data found.   Intake/Output Summary (Last 24 hours) at 08/13/2023 1638 Last data filed at 08/13/2023 1637 Gross per 24 hour  Intake --  Output 1000 ml  Net -1000 ml   Filed Weights   08/10/23 1300 08/12/23 0500 08/13/23 0336  Weight: 92.4 kg 91.1 kg 91.1 kg    Exam:  GEN: NAD, sitting up in the chair SKIN: Warm and dry EYES: No pallor or icterus ENT: MMM CV: RRR PULM: CTA B ABD: soft, ND, NT, +BS CNS: AAO x 2  (person and place), non focal EXT: No edema or tenderness        Data Reviewed:   I have personally reviewed following labs and imaging studies:  Labs: Labs show the following:   Basic Metabolic Panel: Recent Labs  Lab 08/08/23 1744 08/09/23 0554 08/10/23 0639 08/11/23 0333 08/12/23 0425 08/13/23 0514  NA 139 141 138 142 140 138  K 3.2* 3.3* 3.3* 4.0 3.6 3.6  CL 103 108 105 108 106 103  CO2 19* 23 25 25 25 26   GLUCOSE 111* 147* 99 85 92 99  BUN 14 17 35* 35* 25* 21  CREATININE 1.10* 1.10* 1.22* 1.17* 1.02* 1.10*  CALCIUM 8.7* 8.1* 8.2* 8.4* 8.4* 8.4*  MG 1.8  --  2.6* 2.3 2.3 2.3  PHOS 1.3*  --  3.4 2.6 3.4 3.6   GFR Estimated Creatinine Clearance: 44.1 mL/min (A) (by C-G formula based on SCr of 1.1 mg/dL (H)). Liver Function Tests: Recent Labs  Lab 08/08/23 1744 08/09/23 0554 08/12/23 0425  AST 40 36  --   ALT 18 16  --   ALKPHOS 78 35*  --   BILITOT 3.1* 1.9*  --   PROT 7.1 5.9*  --   ALBUMIN 3.8 3.0* 3.0*   Recent Labs  Lab 08/08/23 1744  LIPASE 87*   Recent Labs  Lab 08/08/23 2234  AMMONIA 26   Coagulation profile No results for input(s): "INR", "PROTIME" in the last 168 hours.  CBC: Recent Labs  Lab 08/08/23 1744 08/09/23 0554 08/10/23 0639 08/11/23 0333  WBC 5.8 8.0 7.0 4.9  NEUTROABS 5.3  --  5.5  --   HGB 10.1* 8.4* 8.4* 9.1*  HCT 31.7* 26.9* 27.2* 28.5*  MCV 87.8 90.9 91.0 89.6  PLT 137* 93* 105* 117*   Cardiac Enzymes: No results for input(s): "CKTOTAL", "CKMB", "CKMBINDEX", "TROPONINI" in the last 168 hours. BNP (last 3 results) No results for input(s): "PROBNP" in the last 8760 hours. CBG: Recent Labs  Lab 08/10/23 0701 08/10/23 0744 08/10/23 1155 08/10/23 1308  GLUCAP 98 88 78 78   D-Dimer: No results for input(s): "DDIMER" in the last 72 hours. Hgb A1c: No results for input(s): "HGBA1C" in the last 72 hours. Lipid Profile: No results for input(s): "CHOL", "HDL", "LDLCALC", "TRIG", "CHOLHDL", "LDLDIRECT" in  the last 72 hours.  Thyroid function studies: No results for input(s): "TSH", "T4TOTAL", "T3FREE", "THYROIDAB" in the last 72 hours.  Invalid input(s): "FREET3" Anemia work up: No results for input(s): "VITAMINB12", "FOLATE", "FERRITIN", "TIBC", "IRON", "RETICCTPCT" in the last 72 hours. Sepsis Labs: Recent Labs  Lab 08/08/23 1744 08/08/23 2025 08/09/23 0554 08/10/23 0639 08/11/23 0333  WBC 5.8  --  8.0 7.0 4.9  LATICACIDVEN 3.7* 3.0*  --   --   --     Microbiology Recent Results (from the past 240 hours)  Resp panel by RT-PCR (RSV, Flu A&B, Covid) Anterior Nasal Swab     Status: Abnormal   Collection Time: 08/08/23  5:44 PM   Specimen: Anterior Nasal Swab  Result Value Ref Range Status   SARS Coronavirus 2 by RT PCR POSITIVE (A) NEGATIVE Final    Comment: (NOTE) SARS-CoV-2 target nucleic acids are DETECTED.  The SARS-CoV-2 RNA is generally detectable in upper respiratory specimens during the acute phase of infection. Positive results are indicative of the presence of the identified virus, but do not rule out bacterial infection or co-infection with other pathogens not detected by the test. Clinical correlation with patient history and other diagnostic information is necessary to determine patient infection status. The expected result is Negative.  Fact Sheet for Patients: BloggerCourse.com  Fact Sheet for Healthcare Providers: SeriousBroker.it  This test is not yet approved or cleared by the [place] and  has been authorized  for detection and/or diagnosis of SARS-CoV-2 by FDA under an Emergency Use Authorization (EUA).  This EUA will remain in effect (meaning this test can be used) for the duration of  the COVID-19 declaration under Section 564(b)(1) of the A ct, 21 U.S.C. section 360bbb-3(b)(1), unless the authorization is terminated or revoked sooner.     Influenza A by PCR NEGATIVE NEGATIVE Final    Influenza B by PCR NEGATIVE NEGATIVE Final    Comment: (NOTE) The Xpert Xpress SARS-CoV-2/FLU/RSV plus assay is intended as an aid in the diagnosis of influenza from Nasopharyngeal swab specimens and should not be used as a sole basis for treatment. Nasal washings and aspirates are unacceptable for Xpert Xpress SARS-CoV-2/FLU/RSV testing.  Fact Sheet for Patients: BloggerCourse.com  Fact Sheet for Healthcare Providers: SeriousBroker.it  This test is not yet approved or cleared by the [place] FDA and has been authorized for detection and/or diagnosis of SARS-CoV-2 by FDA under an Emergency Use Authorization (EUA). This EUA will remain in effect (meaning this test can be used) for the duration of the COVID-19 declaration under Section 564(b)(1) of the Act, 21 U.S.C. section 360bbb-3(b)(1), unless the authorization is terminated or revoked.     Resp Syncytial Virus by PCR NEGATIVE NEGATIVE Final    Comment: (NOTE) Fact Sheet for Patients: BloggerCourse.com  Fact Sheet for Healthcare Providers: SeriousBroker.it  This test is not yet approved or cleared by the [place] FDA and has been authorized for detection and/or diagnosis of SARS-CoV-2 by FDA under an Emergency Use Authorization (EUA). This EUA will remain in effect (meaning this test can be used) for the duration of the COVID-19 declaration under Section 564(b)(1) of the Act, 21 U.S.C. section 360bbb-3(b)(1), unless the authorization is terminated or revoked.  Performed at [facility] [place] [facility], [address] [place], [place] [address]   Culture, blood (routine x 2)     Status: Abnormal   Collection Time: 08/08/23  6:03 PM   Specimen: BLOOD  Result Value Ref Range Status   Specimen Description   Final    BLOOD BLOOD RIGHT FOREARM Performed at [facility], [address] [place], [place]  [address]    Special Requests   Final    BOTTLES DRAWN AEROBIC AND ANAEROBIC Blood Culture results may not be optimal due to an inadequate volume of blood received in culture bottles Performed at [facility], [address], [place], [place] [address]    Culture  Setup Time   Final    GRAM NEGATIVE RODS IN BOTH AEROBIC AND ANAEROBIC BOTTLES CRITICAL RESULT CALLED TO, READ BACK BY AND VERIFIED WITH: RAQUEL RODRIGUEZ GUZMAN AT 0710 08/09/23.PMF Performed at [facility], [address] [place], [place] [address]    Culture ESCHERICHIA COLI (A)  Final   Report Status 08/11/2023 FINAL  Final   Organism ID, Bacteria ESCHERICHIA COLI  Final   Organism ID, Bacteria ESCHERICHIA COLI  Final      Susceptibility   Escherichia coli - KIRBY BAUER*    CEFAZOLIN INTERMEDIATE Intermediate    Escherichia coli - MIC*    AMPICILLIN >=32 RESISTANT Resistant     CEFEPIME <=0.12 SENSITIVE Sensitive     CEFTAZIDIME <=1 SENSITIVE Sensitive     CEFTRIAXONE <=0.25 SENSITIVE Sensitive     CIPROFLOXACIN <=0.25 SENSITIVE Sensitive     GENTAMICIN <=1 SENSITIVE Sensitive     IMIPENEM <=0.25 SENSITIVE Sensitive     TRIMETH/SULFA >=320 RESISTANT Resistant     AMPICILLIN/SULBACTAM 16 INTERMEDIATE Intermediate  PIP/TAZO <=4 SENSITIVE Sensitive ug/mL    * ESCHERICHIA COLI    ESCHERICHIA COLI  Blood Culture ID Panel (Reflexed)     Status: Abnormal   Collection Time: 08/08/23  6:03 PM  Result Value Ref Range Status   Enterococcus faecalis NOT DETECTED NOT DETECTED Final   Enterococcus Faecium NOT DETECTED NOT DETECTED Final   Listeria monocytogenes NOT DETECTED NOT DETECTED Final   Staphylococcus species NOT DETECTED NOT DETECTED Final   Staphylococcus aureus (BCID) NOT DETECTED NOT DETECTED Final   Staphylococcus epidermidis NOT DETECTED NOT DETECTED Final   Staphylococcus lugdunensis NOT DETECTED NOT DETECTED Final   Streptococcus species NOT DETECTED NOT DETECTED Final   Streptococcus  agalactiae NOT DETECTED NOT DETECTED Final   Streptococcus pneumoniae NOT DETECTED NOT DETECTED Final   Streptococcus pyogenes NOT DETECTED NOT DETECTED Final   A.calcoaceticus-baumannii NOT DETECTED NOT DETECTED Final   Bacteroides fragilis NOT DETECTED NOT DETECTED Final   Enterobacterales DETECTED (A) NOT DETECTED Final    Comment: Enterobacterales represent a large order of gram negative bacteria, not a single organism. CRITICAL RESULT CALLED TO, READ BACK BY AND VERIFIED WITH: RAQUEL RODRIGUEZ GUZMAN AT 0710 08/09/23.PMF    Enterobacter cloacae complex NOT DETECTED NOT DETECTED Final   Escherichia coli DETECTED (A) NOT DETECTED Final    Comment: CRITICAL RESULT CALLED TO, READ BACK BY AND VERIFIED WITH: RAQUEL RODRIGUEZ GUZMAN AT 0710 08/09/23.PMF    Klebsiella aerogenes NOT DETECTED NOT DETECTED Final   Klebsiella oxytoca NOT DETECTED NOT DETECTED Final   Klebsiella pneumoniae NOT DETECTED NOT DETECTED Final   Proteus species NOT DETECTED NOT DETECTED Final   Salmonella species NOT DETECTED NOT DETECTED Final   Serratia marcescens NOT DETECTED NOT DETECTED Final   Haemophilus influenzae NOT DETECTED NOT DETECTED Final   Neisseria meningitidis NOT DETECTED NOT DETECTED Final   Pseudomonas aeruginosa NOT DETECTED NOT DETECTED Final   Stenotrophomonas maltophilia NOT DETECTED NOT DETECTED Final   Candida albicans NOT DETECTED NOT DETECTED Final   Candida auris NOT DETECTED NOT DETECTED Final   Candida glabrata NOT DETECTED NOT DETECTED Final   Candida krusei NOT DETECTED NOT DETECTED Final   Candida parapsilosis NOT DETECTED NOT DETECTED Final   Candida tropicalis NOT DETECTED NOT DETECTED Final   Cryptococcus neoformans/gattii NOT DETECTED NOT DETECTED Final   CTX-M ESBL NOT DETECTED NOT DETECTED Final   Carbapenem resistance IMP NOT DETECTED NOT DETECTED Final   Carbapenem resistance KPC NOT DETECTED NOT DETECTED Final   Carbapenem resistance NDM NOT DETECTED NOT DETECTED Final    Carbapenem resist OXA 48 LIKE NOT DETECTED NOT DETECTED Final   Carbapenem resistance VIM NOT DETECTED NOT DETECTED Final    Comment: Performed at [facility], [address] [place], [place] [address]  Culture, blood (routine x 2)     Status: Abnormal   Collection Time: 08/08/23  8:25 PM   Specimen: BLOOD  Result Value Ref Range Status   Specimen Description BLOOD BLOOD LEFT HAND  Final   Special Requests   Final    BOTTLES DRAWN AEROBIC AND ANAEROBIC Blood Culture adequate volume   Culture  Setup Time   Final    GRAM NEGATIVE RODS IN BOTH AEROBIC AND ANAEROBIC BOTTLES CRITICAL RESULT CALLED TO, READ BACK BY AND VERIFIED WITH: RAQUEL RODRIGUEZ GUZMAN AT 0710 08/09/23.PMF    Culture (A)  Final    ESCHERICHIA COLI SUSCEPTIBILITIES PERFORMED ON PREVIOUS CULTURE WITHIN THE LAST 5 DAYS.    Report Status 08/11/2023 FINAL  Final  Procedures and diagnostic studies:  No results found.              LOS: 5 days   Leily Capek  [facility]   Pager on [email or web address]. If 7PM-7AM, please contact night-coverage at [email or web address]     08/13/2023, 4:38 PM

## 2023-08-13 NOTE — Progress Notes (Addendum)
PT Cancellation Note  Patient Details Name: Anita Bennett MRN: [id number] DOB: 12/13/1940   Cancelled Treatment:     PT attempt. RN staff in room assisting pt back to bed from recliner. Author will return later this date and continue to follow per current POC.   1325: Author return as requested however pt's lunch arrived. Pt had barely touched breakfast tray at bedside. Author assisted with lunch tray set up and pt states she will try to eat. Encouraged pt to attempt OOB but she remains unwilling." Can you come back tomorrow morning?" Author educated pt on importance of intake and nutrition in addition to needing to get up and move. " I promise I will tomorrow."    Rushie Chestnut 08/13/2023, 12:44 PM

## 2023-08-14 DIAGNOSIS — B9620 Unspecified Escherichia coli [E. coli] as the cause of diseases classified elsewhere: Secondary | ICD-10-CM

## 2023-08-14 DIAGNOSIS — N3000 Acute cystitis without hematuria: Secondary | ICD-10-CM | POA: Diagnosis not present

## 2023-08-14 DIAGNOSIS — R7881 Bacteremia: Secondary | ICD-10-CM | POA: Diagnosis not present

## 2023-08-14 DIAGNOSIS — K859 Acute pancreatitis without necrosis or infection, unspecified: Secondary | ICD-10-CM | POA: Diagnosis not present

## 2023-08-14 DIAGNOSIS — F419 Anxiety disorder, unspecified: Secondary | ICD-10-CM

## 2023-08-14 DIAGNOSIS — I5032 Chronic diastolic (congestive) heart failure: Secondary | ICD-10-CM

## 2023-08-14 DIAGNOSIS — E039 Hypothyroidism, unspecified: Secondary | ICD-10-CM | POA: Diagnosis not present

## 2023-08-14 MED ORDER — LACTULOSE 10 GM/15ML PO SOLN
30.0000 g | Freq: Two times a day (BID) | ORAL | Status: DC
Start: 2023-08-14 — End: 2023-08-15
  Administered 2023-08-14: 30 g via ORAL
  Filled 2023-08-14 (×2): qty 60

## 2023-08-14 NOTE — Plan of Care (Signed)
  Problem: Education: Goal: Knowledge of risk factors and measures for prevention of condition will improve Outcome: Progressing   Problem: Coping: Goal: Psychosocial and spiritual needs will be supported Outcome: Progressing   Problem: Respiratory: Goal: Will maintain a patent airway Outcome: Progressing   

## 2023-08-14 NOTE — Progress Notes (Signed)
Physical Therapy Treatment Patient Details Name: Anita Bennett MRN: [id number] DOB: Jul 04, 1941 Today's Date: 08/14/2023   History of Present Illness Anita Bennett is a 83 y.o. female past medical history significant for dementia, [name], who presents to the emergency department for altered mental status.  Patient lives at nursing facility but normally takes care of herself on her own.  Patient is here with her daughter who is at bedside.  Yesterday was evaluated at her primary care physician office and we will called in antibiotics for possible urinary tract infection.  Today she had significantly worsening altered mental status with not opening her eyes or responding.  Episode of nausea and vomiting.  Decreased p.o. intake today.  No known significant falls or head trauma. + Covid 19.    PT Comments  Pt was long sitting in bed upon arrival. Pleasantly confused throughout session but is able to follow commands well. Pt was able to exit R side of bed, stand to RW, and take ~3 steps from EOB to recliner. Pt is very slow moving but able to perform. Recommend +2 assistance for safety to stand form recliner and return to bed. Chair alarm in place, and call bell in reach. DC recs remain appropriate.     If plan is discharge home, recommend the following: A little help with walking and/or transfers;A little help with bathing/dressing/bathroom;Assist for transportation     Equipment Recommendations  Other (comment) (Defer to next level of care)       Precautions / Restrictions Precautions Precautions: Fall Precaution Comments: Airborne iso Restrictions Weight Bearing Restrictions Per Provider Order: No     Mobility  Bed Mobility Overal bed mobility: Needs Assistance Bed Mobility: Supine to Sit  Supine to sit: HOB elevated, Min assist   Transfers Overall transfer level: Needs assistance Equipment used: Rolling walker (2 wheels) Transfers: Sit to/from Stand Sit to Stand: Min assist, Mod  assist  General transfer comment: min assist to stand from elevated surfaces. mod A from lowest bed height + recliner surface    Ambulation/Gait Ambulation/Gait assistance: Min assist Gait Distance (Feet): 3 Feet Assistive device: Rolling walker (2 wheels) Gait Pattern/deviations: Step-to pattern Gait velocity: slow  General Gait Details: pt was able to take ~ 3 steps form EOB to recliner with min assist of one    Balance Overall balance assessment: Needs assistance Sitting-balance support: Feet supported Sitting balance-Leahy Scale: Good     Standing balance support: Bilateral upper extremity supported, During functional activity, Reliant on assistive device for balance Standing balance-Leahy Scale: Poor Standing balance comment: pt is high fall risk       Cognition Arousal: Alert Behavior During Therapy: WFL for tasks assessed/performed Overall Cognitive Status: History of cognitive impairments - at baseline    General Comments: hx of dementia; follows 1 steps commands without difficulty               Pertinent Vitals/Pain Pain Assessment Pain Assessment: No/denies pain Pain Score: 0-No pain     PT Goals (current goals can now be found in the care plan section) Acute Rehab PT Goals Patient Stated Goal: none stated Progress towards PT goals: Progressing toward goals    Frequency    Min 1X/week       AM-PAC PT "6 Clicks" Mobility   Outcome Measure  Help needed turning from your back to your side while in a flat bed without using bedrails?: A Little Help needed moving from lying on your back to sitting on the side  of a flat bed without using bedrails?: A Little Help needed moving to and from a bed to a chair (including a wheelchair)?: A Lot Help needed standing up from a chair using your arms (e.g., wheelchair or bedside chair)?: A Lot Help needed to walk in hospital room?: A Little Help needed climbing 3-5 steps with a railing? : Total 6 Click Score:  14    End of Session   Activity Tolerance: Patient tolerated treatment well Patient left: in chair;with call bell/phone within reach;with chair alarm set Nurse Communication: Mobility status PT Visit Diagnosis: Other abnormalities of gait and mobility (R26.89);Muscle weakness (generalized) (M62.81);Difficulty in walking, not elsewhere classified (R26.2)     Time: 9528-4132 PT Time Calculation (min) (ACUTE ONLY): 23 min  Charges:    $Therapeutic Activity: 23-37 mins PT General Charges $$ ACUTE PT VISIT: 1 Visit                     Jetta Lout PTA 08/14/23, 8:39 AM

## 2023-08-14 NOTE — Progress Notes (Signed)
Progress Note    Anita Bennett  UYQ:[id number] DOB: 1941/07/11  DOA: 08/08/2023 PCP: Wilford Corner, PA-C      Brief Narrative:    Medical records reviewed and are as summarized below:  Anita Bennett is a 83 y.o. female with medical history significant for hypertension, hyperlipidemia, chronic HFpEF, hypothyroidism, dementia, depression, anxiety, CKD stage IIIa, liver cirrhosis secondary to NAFLD, kidney stones, obesity.  She presented from SNF to the hospital because of altered mental status.  There was also report of nausea and vomiting.  She was seen by her PCP the day prior to admission and was found to have UTI.  She had been prescribed antibiotics but she had not started it yet.  She was hypoxic with oxygen saturation of 86% on room air and she tested positive for COVID-19 infection.  Lactic acid was elevated at 3.7 on admission.  BNP was elevated at 1,199.6.  1/22: Medically stable, awaiting evaluation by her ALF facility nursing staff so she can go back.   Assessment/Plan:   Principal Problem:   Severe sepsis (HCC) Active Problems:   E coli bacteremia   Acute pancreatitis   UTI (urinary tract infection)   Essential hypertension   Adult hypothyroidism   Liver cirrhosis secondary to NASH (nonalcoholic steatohepatitis) (HCC)   Chronic diastolic CHF (congestive heart failure) (HCC)   Hypokalemia   Hypophosphatemia   Acute metabolic encephalopathy   Chronic kidney disease, stage 3a (HCC)   COVID-19 virus infection   Anxiety   Obesity (BMI 35.0-39.9 without comorbidity)    Body mass index is 31.32 kg/m.  (Obesity)   Severe sepsis secondary to acute UTI and E. coli bacteremia: Blood culture showed E. coli.  She was initially treated with IV ceftriaxone.  Continue Levaquin through 08/17/2023     Acute metabolic encephalopathy on underlying dementia: Improved.  Patient appears to be at baseline. Daughter said patient was lethargic and could not even  speak before admission.     Acute hypoxemic respiratory failure: Improved.       Nausea and vomiting: Improved.   Suspected mild acute pancreatitis on CT abdomen: No abdominal pain.  Lipase was only 87.  Significance of this finding on CT is unclear.     COVID-19 infection: Supportive care as needed. [name], patient's daughter, said patient had COVID-19 infection about 2 weeks prior to admission.     Hypokalemia, hypophosphatemia: Improved       Acute thrombocytopenia: Platelet count is improving.  May be related to sepsis.  Outpatient follow-up with PCP to monitor platelet count     Acute on chronic HFpEF: Improved and now at baseline She had IV Lasix on admission.  Hypertension: BP is better.  Continue Lasix and lisinopril. 2D echo in August 2024 showed normal LV function with EF greater than 55%, moderate LVH, moderate mitral stenosis, mild aortic stenosis, mild AR, mild MR, mild TR.     Liver cirrhosis: Compensated.  Continue lactulose and nadolol     Comorbidities include hypertension, hypothyroidism, CKD stage IIIa, depression, anxiety     Medically stable for discharge but awaiting placement.  ALF representative wants to assess patient to make sure she is eligible to go back to ALF.  Follow-up with transition of care team to assist with disposition    Diet Order             Diet 2 gram sodium Fluid consistency: Thin  Diet effective now  Diet - low sodium heart healthy                   Consultants: None  Procedures: None    Medications:    aspirin EC  81 mg Oral Daily   enoxaparin (LOVENOX) injection  0.5 mg/kg Subcutaneous Q24H   feeding supplement  237 mL Oral BID BM   furosemide  40 mg Oral Daily   lactulose  30 g Oral BID   levofloxacin  750 mg Oral Q48H   levothyroxine  100 mcg Oral Q0600   lisinopril  5 mg Oral Daily   memantine  5 mg Oral BID   nadolol  20 mg Oral QPM   PARoxetine  40 mg Oral Daily   QUEtiapine  25 mg Oral QHS    Continuous Infusions:     Anti-infectives (From admission, onward)    Start     Dose/Rate Route Frequency Ordered Stop   08/14/23 0000  levofloxacin (LEVAQUIN) 750 MG tablet        750 mg Oral Every 48 hours 08/12/23 1048 08/17/23 2359   08/12/23 2200  levofloxacin (LEVAQUIN) tablet 750 mg        750 mg Oral Every 48 hours 08/12/23 0732 08/18/23 2159   08/09/23 2000  cefTRIAXone (ROCEPHIN) 2 g in sodium chloride 0.9 % 100 mL IVPB  Status:  Discontinued        2 g 200 mL/hr over 30 Minutes Intravenous Every 24 hours 08/08/23 2233 08/12/23 0732   08/08/23 2000  cefTRIAXone (ROCEPHIN) 2 g in sodium chloride 0.9 % 100 mL IVPB        2 g 200 mL/hr over 30 Minutes Intravenous Once 08/08/23 1952 08/08/23 2109   08/08/23 2000  metroNIDAZOLE (FLAGYL) IVPB 500 mg        500 mg 100 mL/hr over 60 Minutes Intravenous  Once 08/08/23 1956 08/08/23 2152       Family Communication/Anticipated D/C date and plan/Code Status   DVT prophylaxis:      Code Status: Full Code  Family Communication:  Disposition Plan: Plan to discharge to SNF versus ALF   Status is: Inpatient Remains inpatient appropriate because: Sepsis and bacteremia   Subjective:   Patient was sitting comfortably in chair when seen today.  She wants to go back to her home.  No new concern  Objective:    Vitals:   08/13/23 1500 08/13/23 2348 08/14/23 0407 08/14/23 0909  BP: (!) 157/66 (!) 129/58  127/76  Pulse: 61 (!) 56  (!) 53  Resp: 18 16  16   Temp: 99.1 F (37.3 C) 99 F (37.2 C)  98.7 F (37.1 C)  TempSrc: Oral Oral  Oral  SpO2: 99% 97%  98%  Weight:   90.7 kg   Height:       No data found.   Intake/Output Summary (Last 24 hours) at 08/14/2023 1536 Last data filed at 08/14/2023 0936 Gross per 24 hour  Intake --  Output 1300 ml  Net -1300 ml   Filed Weights   08/12/23 0500 08/13/23 0336 08/14/23 0407  Weight: 91.1 kg 91.1 kg 90.7 kg    Exam:  General.  Overweight elderly lady, in no acute  distress. Pulmonary.  Lungs clear bilaterally, normal respiratory effort. CV.  Regular rate and rhythm, no JVD, rub or murmur. Abdomen.  Soft, nontender, nondistended, BS positive. CNS.  Alert and oriented .  No focal neurologic deficit. Extremities.  Trace LE edema, no cyanosis, pulses intact and  symmetrical.   Data Reviewed:   I have personally reviewed following labs and imaging studies:  Labs: Labs show the following:   Basic Metabolic Panel: Recent Labs  Lab 08/08/23 1744 08/09/23 0554 08/10/23 0639 08/11/23 0333 08/12/23 0425 08/13/23 0514  NA 139 141 138 142 140 138  K 3.2* 3.3* 3.3* 4.0 3.6 3.6  CL 103 108 105 108 106 103  CO2 19* 23 25 25 25 26   GLUCOSE 111* 147* 99 85 92 99  BUN 14 17 35* 35* 25* 21  CREATININE 1.10* 1.10* 1.22* 1.17* 1.02* 1.10*  CALCIUM 8.7* 8.1* 8.2* 8.4* 8.4* 8.4*  MG 1.8  --  2.6* 2.3 2.3 2.3  PHOS 1.3*  --  3.4 2.6 3.4 3.6   GFR Estimated Creatinine Clearance: 44 mL/min (A) (by C-G formula based on SCr of 1.1 mg/dL (H)). Liver Function Tests: Recent Labs  Lab 08/08/23 1744 08/09/23 0554 08/12/23 0425  AST 40 36  --   ALT 18 16  --   ALKPHOS 78 35*  --   BILITOT 3.1* 1.9*  --   PROT 7.1 5.9*  --   ALBUMIN 3.8 3.0* 3.0*   Recent Labs  Lab 08/08/23 1744  LIPASE 87*   Recent Labs  Lab 08/08/23 2234  AMMONIA 26   Coagulation profile No results for input(s): "INR", "PROTIME" in the last 168 hours.  CBC: Recent Labs  Lab 08/08/23 1744 08/09/23 0554 08/10/23 0639 08/11/23 0333  WBC 5.8 8.0 7.0 4.9  NEUTROABS 5.3  --  5.5  --   HGB 10.1* 8.4* 8.4* 9.1*  HCT 31.7* 26.9* 27.2* 28.5*  MCV 87.8 90.9 91.0 89.6  PLT 137* 93* 105* 117*   Cardiac Enzymes: No results for input(s): "CKTOTAL", "CKMB", "CKMBINDEX", "TROPONINI" in the last 168 hours. BNP (last 3 results) No results for input(s): "PROBNP" in the last 8760 hours. CBG: Recent Labs  Lab 08/10/23 0701 08/10/23 0744 08/10/23 1155 08/10/23 1308  GLUCAP 98 88  78 78   D-Dimer: No results for input(s): "DDIMER" in the last 72 hours. Hgb A1c: No results for input(s): "HGBA1C" in the last 72 hours. Lipid Profile: No results for input(s): "CHOL", "HDL", "LDLCALC", "TRIG", "CHOLHDL", "LDLDIRECT" in the last 72 hours.  Thyroid function studies: No results for input(s): "TSH", "T4TOTAL", "T3FREE", "THYROIDAB" in the last 72 hours.  Invalid input(s): "FREET3" Anemia work up: No results for input(s): "VITAMINB12", "FOLATE", "FERRITIN", "TIBC", "IRON", "RETICCTPCT" in the last 72 hours. Sepsis Labs: Recent Labs  Lab 08/08/23 1744 08/08/23 2025 08/09/23 0554 08/10/23 0639 08/11/23 0333  WBC 5.8  --  8.0 7.0 4.9  LATICACIDVEN 3.7* 3.0*  --   --   --     Microbiology Recent Results (from the past 240 hours)  Resp panel by RT-PCR (RSV, Flu A&B, Covid) Anterior Nasal Swab     Status: Abnormal   Collection Time: 08/08/23  5:44 PM   Specimen: Anterior Nasal Swab  Result Value Ref Range Status   SARS Coronavirus 2 by RT PCR POSITIVE (A) NEGATIVE Final    Comment: (NOTE) SARS-CoV-2 target nucleic acids are DETECTED.  The SARS-CoV-2 RNA is generally detectable in upper respiratory specimens during the acute phase of infection. Positive results are indicative of the presence of the identified virus, but do not rule out bacterial infection or co-infection with other pathogens not detected by the test. Clinical correlation with patient history and other diagnostic information is necessary to determine patient infection status. The expected result is Negative.  Fact  Sheet for Patients: BloggerCourse.com  Fact Sheet for Healthcare Providers: SeriousBroker.it  This test is not yet approved or cleared by the [place] FDA and  has been authorized for detection and/or diagnosis of SARS-CoV-2 by FDA under an Emergency Use Authorization (EUA).  This EUA will remain in effect (meaning this test  can be used) for the duration of  the COVID-19 declaration under Section 564(b)(1) of the A ct, 21 U.S.C. section 360bbb-3(b)(1), unless the authorization is terminated or revoked sooner.     Influenza A by PCR NEGATIVE NEGATIVE Final   Influenza B by PCR NEGATIVE NEGATIVE Final    Comment: (NOTE) The Xpert Xpress SARS-CoV-2/FLU/RSV plus assay is intended as an aid in the diagnosis of influenza from Nasopharyngeal swab specimens and should not be used as a sole basis for treatment. Nasal washings and aspirates are unacceptable for Xpert Xpress SARS-CoV-2/FLU/RSV testing.  Fact Sheet for Patients: BloggerCourse.com  Fact Sheet for Healthcare Providers: SeriousBroker.it  This test is not yet approved or cleared by the [place] FDA and has been authorized for detection and/or diagnosis of SARS-CoV-2 by FDA under an Emergency Use Authorization (EUA). This EUA will remain in effect (meaning this test can be used) for the duration of the COVID-19 declaration under Section 564(b)(1) of the Act, 21 U.S.C. section 360bbb-3(b)(1), unless the authorization is terminated or revoked.     Resp Syncytial Virus by PCR NEGATIVE NEGATIVE Final    Comment: (NOTE) Fact Sheet for Patients: BloggerCourse.com  Fact Sheet for Healthcare Providers: SeriousBroker.it  This test is not yet approved or cleared by the [place] FDA and has been authorized for detection and/or diagnosis of SARS-CoV-2 by FDA under an Emergency Use Authorization (EUA). This EUA will remain in effect (meaning this test can be used) for the duration of the COVID-19 declaration under Section 564(b)(1) of the Act, 21 U.S.C. section 360bbb-3(b)(1), unless the authorization is terminated or revoked.  Performed at [facility], [address] [place], [place] [address]   Culture, blood (routine x 2)      Status: Abnormal   Collection Time: 08/08/23  6:03 PM   Specimen: BLOOD  Result Value Ref Range Status   Specimen Description   Final    BLOOD BLOOD RIGHT FOREARM Performed at [facility], [address], [place], [place] [address]    Special Requests   Final    BOTTLES DRAWN AEROBIC AND ANAEROBIC Blood Culture results may not be optimal due to an inadequate volume of blood received in culture bottles Performed at [facility], [address], [place], [place] [address]    Culture  Setup Time   Final    GRAM NEGATIVE RODS IN BOTH AEROBIC AND ANAEROBIC BOTTLES CRITICAL RESULT CALLED TO, READ BACK BY AND VERIFIED WITH: RAQUEL RODRIGUEZ GUZMAN AT 0710 08/09/23.PMF Performed at [facility], [address] [place], [place] [address]    Culture ESCHERICHIA COLI (A)  Final   Report Status 08/11/2023 FINAL  Final   Organism ID, Bacteria ESCHERICHIA COLI  Final   Organism ID, Bacteria ESCHERICHIA COLI  Final      Susceptibility   Escherichia coli - KIRBY BAUER*    CEFAZOLIN INTERMEDIATE Intermediate    Escherichia coli - MIC*    AMPICILLIN >=32 RESISTANT Resistant     CEFEPIME <=0.12 SENSITIVE Sensitive     CEFTAZIDIME <=1 SENSITIVE Sensitive     CEFTRIAXONE <=0.25 SENSITIVE Sensitive     CIPROFLOXACIN <=0.25 SENSITIVE Sensitive  GENTAMICIN <=1 SENSITIVE Sensitive     IMIPENEM <=0.25 SENSITIVE Sensitive     TRIMETH/SULFA >=320 RESISTANT Resistant     AMPICILLIN/SULBACTAM 16 INTERMEDIATE Intermediate     PIP/TAZO <=4 SENSITIVE Sensitive ug/mL    * ESCHERICHIA COLI    ESCHERICHIA COLI  Blood Culture ID Panel (Reflexed)     Status: Abnormal   Collection Time: 08/08/23  6:03 PM  Result Value Ref Range Status   Enterococcus faecalis NOT DETECTED NOT DETECTED Final   Enterococcus Faecium NOT DETECTED NOT DETECTED Final   Listeria monocytogenes NOT DETECTED NOT DETECTED Final   Staphylococcus species NOT DETECTED NOT DETECTED Final   Staphylococcus aureus  (BCID) NOT DETECTED NOT DETECTED Final   Staphylococcus epidermidis NOT DETECTED NOT DETECTED Final   Staphylococcus lugdunensis NOT DETECTED NOT DETECTED Final   Streptococcus species NOT DETECTED NOT DETECTED Final   Streptococcus agalactiae NOT DETECTED NOT DETECTED Final   Streptococcus pneumoniae NOT DETECTED NOT DETECTED Final   Streptococcus pyogenes NOT DETECTED NOT DETECTED Final   A.calcoaceticus-baumannii NOT DETECTED NOT DETECTED Final   Bacteroides fragilis NOT DETECTED NOT DETECTED Final   Enterobacterales DETECTED (A) NOT DETECTED Final    Comment: Enterobacterales represent a large order of gram negative bacteria, not a single organism. CRITICAL RESULT CALLED TO, READ BACK BY AND VERIFIED WITH: RAQUEL RODRIGUEZ GUZMAN AT 0710 08/09/23.PMF    Enterobacter cloacae complex NOT DETECTED NOT DETECTED Final   Escherichia coli DETECTED (A) NOT DETECTED Final    Comment: CRITICAL RESULT CALLED TO, READ BACK BY AND VERIFIED WITH: RAQUEL RODRIGUEZ GUZMAN AT 0710 08/09/23.PMF    Klebsiella aerogenes NOT DETECTED NOT DETECTED Final   Klebsiella oxytoca NOT DETECTED NOT DETECTED Final   Klebsiella pneumoniae NOT DETECTED NOT DETECTED Final   Proteus species NOT DETECTED NOT DETECTED Final   Salmonella species NOT DETECTED NOT DETECTED Final   Serratia marcescens NOT DETECTED NOT DETECTED Final   Haemophilus influenzae NOT DETECTED NOT DETECTED Final   Neisseria meningitidis NOT DETECTED NOT DETECTED Final   Pseudomonas aeruginosa NOT DETECTED NOT DETECTED Final   Stenotrophomonas maltophilia NOT DETECTED NOT DETECTED Final   Candida albicans NOT DETECTED NOT DETECTED Final   Candida auris NOT DETECTED NOT DETECTED Final   Candida glabrata NOT DETECTED NOT DETECTED Final   Candida krusei NOT DETECTED NOT DETECTED Final   Candida parapsilosis NOT DETECTED NOT DETECTED Final   Candida tropicalis NOT DETECTED NOT DETECTED Final   Cryptococcus neoformans/gattii NOT DETECTED NOT  DETECTED Final   CTX-M ESBL NOT DETECTED NOT DETECTED Final   Carbapenem resistance IMP NOT DETECTED NOT DETECTED Final   Carbapenem resistance KPC NOT DETECTED NOT DETECTED Final   Carbapenem resistance NDM NOT DETECTED NOT DETECTED Final   Carbapenem resist OXA 48 LIKE NOT DETECTED NOT DETECTED Final   Carbapenem resistance VIM NOT DETECTED NOT DETECTED Final    Comment: Performed at [facility], [address] [place], [place] [address]  Culture, blood (routine x 2)     Status: Abnormal   Collection Time: 08/08/23  8:25 PM   Specimen: BLOOD  Result Value Ref Range Status   Specimen Description BLOOD BLOOD LEFT HAND  Final   Special Requests   Final    BOTTLES DRAWN AEROBIC AND ANAEROBIC Blood Culture adequate volume   Culture  Setup Time   Final    GRAM NEGATIVE RODS IN BOTH AEROBIC AND ANAEROBIC BOTTLES CRITICAL RESULT CALLED TO, READ BACK BY AND VERIFIED WITH: RAQUEL RODRIGUEZ GUZMAN AT 0710  08/09/23.PMF    Culture (A)  Final    ESCHERICHIA COLI SUSCEPTIBILITIES PERFORMED ON PREVIOUS CULTURE WITHIN THE LAST 5 DAYS.    Report Status 08/11/2023 FINAL  Final    Procedures and diagnostic studies:  No results found.    LOS: 6 days   Arnetha Courser  [facility]   Pager on [email or web address]. If 7PM-7AM, please contact night-coverage at [email or web address]  08/14/2023, 3:36 PM

## 2023-08-14 NOTE — Plan of Care (Signed)
  Problem: Education: Goal: Knowledge of risk factors and measures for prevention of condition will improve Outcome: Progressing   Problem: Respiratory: Goal: Will maintain a patent airway Outcome: Progressing Goal: Complications related to the disease process, condition or treatment will be avoided or minimized Outcome: Progressing   Problem: Education: Goal: Knowledge of General Education information will improve Description: Including pain rating scale, medication(s)/side effects and non-pharmacologic comfort measures Outcome: Progressing   Problem: Activity: Goal: Risk for activity intolerance will decrease Outcome: Progressing   Problem: Nutrition: Goal: Adequate nutrition will be maintained Outcome: Progressing   Problem: Pain Managment: Goal: General experience of comfort will improve and/or be controlled Outcome: Progressing   Problem: Safety: Goal: Ability to remain free from injury will improve Outcome: Progressing

## 2023-08-14 NOTE — Progress Notes (Signed)
Occupational Therapy Treatment Patient Details Name: Anita Bennett MRN: [id number] DOB: Mar 02, 1941 Today's Date: 08/14/2023   History of present illness Anita Bennett is a 83 y.o. female past medical history significant for dementia, [name], who presents to the emergency department for altered mental status.  Patient lives at nursing facility but normally takes care of herself on her own.  Patient is here with her daughter who is at bedside.  Yesterday was evaluated at her primary care physician office and we will called in antibiotics for possible urinary tract infection.  Today she had significantly worsening altered mental status with not opening her eyes or responding.  Episode of nausea and vomiting.  Decreased p.o. intake today.  No known significant falls or head trauma. + Covid 19.   OT comments  Ms Zaldivar was seen for OT treatment on this date. Upon arrival to room pt seated in chair, agreeable to tx. Pt requires MIN A + RW sit<>stand from chair x2 and CGA + RW for toilet t/f, cues for safe RW use. MOD I don B shoes in sitting. Pt making progress toward goals, will continue to follow POC. Discharge recommendation remains appropriate.      If plan is discharge home, recommend the following:  A little help with walking and/or transfers;Assistance with cooking/housework;Supervision due to cognitive status;Assist for transportation;A lot of help with bathing/dressing/bathroom   Equipment Recommendations  None recommended by OT    Recommendations for Other Services      Precautions / Restrictions Precautions Precautions: Fall Restrictions Weight Bearing Restrictions Per Provider Order: No       Mobility Bed Mobility               General bed mobility comments: NT, patient received in recliner and remained in recliner at end of session    Transfers Overall transfer level: Needs assistance Equipment used: Rolling walker (2 wheels) Transfers: Sit to/from Stand Sit to  Stand: Min assist                 Balance Overall balance assessment: Needs assistance Sitting-balance support: Feet supported Sitting balance-Leahy Scale: Good     Standing balance support: Bilateral upper extremity supported, During functional activity, Reliant on assistive device for balance Standing balance-Leahy Scale: Fair                             ADL either performed or assessed with clinical judgement   ADL Overall ADL's : Needs assistance/impaired                                       General ADL Comments: MIN A + RW for toilet t/f.      Cognition Arousal: Alert Behavior During Therapy: WFL for tasks assessed/performed Overall Cognitive Status: History of cognitive impairments - at baseline                                                     Pertinent Vitals/ Pain       Pain Assessment Pain Assessment: No/denies pain   Frequency  Min 1X/week        Progress Toward Goals  OT Goals(current goals can now be found in the care  plan section)  Progress towards OT goals: Progressing toward goals  Acute Rehab OT Goals OT Goal Formulation: With patient/family Time For Goal Achievement: 08/24/23 Potential to Achieve Goals: Good ADL Goals Pt Will Perform Grooming: with set-up;with supervision;standing Pt Will Transfer to Toilet: with supervision;ambulating;grab bars Pt Will Perform Toileting - Clothing Manipulation and hygiene: with supervision;sitting/lateral leans  Plan      Co-evaluation                 AM-PAC OT "6 Clicks" Daily Activity     Outcome Measure   Help from another person eating meals?: None Help from another person taking care of personal grooming?: A Little Help from another person toileting, which includes using toliet, bedpan, or urinal?: A Little Help from another person bathing (including washing, rinsing, drying)?: A Lot Help from another person to put on and taking  off regular upper body clothing?: None Help from another person to put on and taking off regular lower body clothing?: A Lot 6 Click Score: 18    End of Session Equipment Utilized During Treatment: Rolling walker (2 wheels)  OT Visit Diagnosis: Unsteadiness on feet (R26.81);Muscle weakness (generalized) (M62.81)   Activity Tolerance Patient tolerated treatment well   Patient Left in chair;with call bell/phone within reach;with chair alarm set   Nurse Communication          Time: 8841-6606 OT Time Calculation (min): 11 min  Charges: OT General Charges $OT Visit: 1 Visit OT Treatments $Self Care/Home Management : 8-22 mins  Kathie Dike, M.S. OTR/L  08/14/23, 3:09 PM  ascom [phone]

## 2023-08-15 DIAGNOSIS — N3000 Acute cystitis without hematuria: Secondary | ICD-10-CM | POA: Diagnosis not present

## 2023-08-15 DIAGNOSIS — K7581 Nonalcoholic steatohepatitis (NASH): Secondary | ICD-10-CM | POA: Diagnosis not present

## 2023-08-15 DIAGNOSIS — R7881 Bacteremia: Secondary | ICD-10-CM | POA: Diagnosis not present

## 2023-08-15 DIAGNOSIS — K859 Acute pancreatitis without necrosis or infection, unspecified: Secondary | ICD-10-CM | POA: Diagnosis not present

## 2023-08-15 LAB — BASIC METABOLIC PANEL
Anion gap: 10 (ref 5–15)
BUN: 21 mg/dL (ref 8–23)
CO2: 23 mmol/L (ref 22–32)
Calcium: 8.5 mg/dL — ABNORMAL LOW (ref 8.9–10.3)
Chloride: 103 mmol/L (ref 98–111)
Creatinine, Ser: 1.29 mg/dL — ABNORMAL HIGH (ref 0.44–1.00)
GFR, Estimated: 41 mL/min — ABNORMAL LOW (ref >60)
Glucose, Bld: 114 mg/dL — ABNORMAL HIGH (ref 70–99)
Potassium: 3.5 mmol/L (ref 3.5–5.1)
Sodium: 136 mmol/L (ref 135–145)

## 2023-08-15 LAB — CBC
HCT: 30.6 % — ABNORMAL LOW (ref 36.0–46.0)
Hemoglobin: 9.9 g/dL — ABNORMAL LOW (ref 12.0–15.0)
MCH: 28.4 pg (ref 26.0–34.0)
MCHC: 32.4 g/dL (ref 30.0–36.0)
MCV: 87.9 fL (ref 80.0–100.0)
Platelets: 161 10*3/uL (ref 150–400)
RBC: 3.48 MIL/uL — ABNORMAL LOW (ref 3.87–5.11)
RDW: 15.2 % (ref 11.5–15.5)
WBC: 6.1 10*3/uL (ref 4.0–10.5)
nRBC: 0 % (ref 0.0–0.2)

## 2023-08-15 MED ORDER — ENSURE ENLIVE PO LIQD: 237.0000 mL | Freq: Two times a day (BID) | ORAL | 12 refills | Status: DC

## 2023-08-15 NOTE — TOC Progression Note (Signed)
Transition of Care [facility]) - Progression Note    Patient Details  Name: Anita Bennett MRN: [id number] Date of Birth: [birth year]  [facility] CM/SW Contact  Marlowe Sax, RN Phone Number: 08/15/2023, 10:30 AM  Clinical Narrative:    Faxed DC summary and orders to [facility], Daughter to transport   Expected Discharge Plan: Skilled Nursing Facility Barriers to Discharge: SNF Pending bed offer  Expected Discharge Plan and Services   Discharge Planning Services: CM Consult   Living arrangements for the past 2 months: Assisted Living Facility Expected Discharge Date: 08/15/23                 DME Agency: NA       HH Arranged: NA           Social Determinants of Health (SDOH) Interventions SDOH Screenings   Food Insecurity: No Food Insecurity (08/10/2023)  Housing: Low Risk  (08/10/2023)  Transportation Needs: No Transportation Needs (08/10/2023)  Utilities: Not At Risk (08/10/2023)  Social Connections: Moderately Integrated (08/10/2023)  Tobacco Use: Low Risk  (08/08/2023)    Readmission Risk Interventions     No data to display

## 2023-08-15 NOTE — Discharge Summary (Signed)
Physician Discharge Summary   Patient: Anita Bennett MRN: [id number] DOB: 1940/12/16  Admit date:     08/08/2023  Discharge date: 08/15/23  Discharge Physician: Arnetha Courser   PCP: Wilford Corner, PA-C   Recommendations at discharge:   Follow-up with PCP in 1 week Please obtain CBC and BMP on follow-up Patient is being discharged for 2 more days of Levaquin, please ensure the completion of course  Discharge Diagnoses: Principal Problem:   Severe sepsis (HCC) Active Problems:   E coli bacteremia   Acute pancreatitis   UTI (urinary tract infection)   Essential hypertension   Adult hypothyroidism   Liver cirrhosis secondary to NASH (nonalcoholic steatohepatitis) (HCC)   Chronic diastolic CHF (congestive heart failure) (HCC)   Hypokalemia   Hypophosphatemia   Acute metabolic encephalopathy   Chronic kidney disease, stage 3a (HCC)   COVID-19 virus infection   Anxiety   Obesity (BMI 35.0-39.9 without comorbidity)  Resolved Problems:   * No resolved hospital problems. *  Hospital Course:  Anita Bennett is a 83 y.o. female with medical history significant for hypertension, hyperlipidemia, chronic HFpEF, hypothyroidism, dementia, depression, anxiety, CKD stage IIIa, liver cirrhosis secondary to NAFLD, kidney stones, obesity.  She presented from SNF to the hospital because of altered mental status.  There was also report of nausea and vomiting.   She was seen by her PCP the day prior to admission and was found to have UTI.  She had been prescribed antibiotics but she had not started it yet.   She was hypoxic with oxygen saturation of 86% on room air and she tested positive for COVID-19 infection.  Lactic acid was elevated at 3.7 on admission.  BNP was elevated at 1,199.6.  Patient was found to have E. coli UTI and bacteremia, she was initially treated with IV ceftriaxone followed by Levaquin and she will complete her course on 08/17/2023.  Her acute metabolic  encephalopathy improved and patient is now at baseline.  She was initially discharged on 1/21 but it got delayed as her facility wants to evaluate her before returning.  Her facility evaluated her on 1/22 and cleared her to return back to her prior setting.  Patient will continue on current medications and need to have a close follow-up with her providers for further management.   Assessment and Plan:   Severe sepsis secondary to acute UTI and E. coli bacteremia: Blood culture showed E. coli.  She was initially treated with IV ceftriaxone.  She will be discharged on oral Levaquin to complete treatment on 08/17/2023.       Acute metabolic encephalopathy on underlying dementia: Improved.  Patient appears to be at baseline. Daughter said patient was lethargic and could not even speak before admission.     Acute hypoxemic respiratory failure: Improved.       Nausea and vomiting: Improved.   Suspected mild acute pancreatitis on CT abdomen: No abdominal pain.  Lipase was only 87.  Significance of this finding on CT is unclear.     COVID-19 infection: Supportive care as needed. [name], patient's daughter, said patient had COVID-19 infection about 2 weeks prior to admission.     Hypokalemia, hypophosphatemia: Improved       Acute thrombocytopenia: Platelet count is improving.  May be related to sepsis.  Outpatient follow-up with PCP to monitor platelet count     Probable acute on chronic HFpEF: She had IV Lasix on admission.  Hypertension: BP is better.  Continue Lasix and lisinopril. 2D  echo in August 2024 showed normal LV function with EF greater than 55%, moderate LVH, moderate mitral stenosis, mild aortic stenosis, mild AR, mild MR, mild TR.   Liver cirrhosis: Compensated.  Continue lactulose and nadolol     Comorbidities include hypertension, hypothyroidism, CKD stage IIIa, depression, anxiety    Her condition has improved and she is deemed stable for discharge to home today.   Discharge plan was discussed with Vernona Rieger, daughter, over the phone.    Consultants: None Procedures performed: None Disposition: Assisted living Diet recommendation:  Discharge Diet Orders (From admission, onward)     Start     Ordered   08/12/23 0000  Diet - low sodium heart healthy        08/12/23 1048           Cardiac diet DISCHARGE MEDICATION: Allergies as of 08/15/2023       Reactions   Epinephrine    Heart race   Promethazine Hcl    hyperactivity   Statins         Medication List     STOP taking these medications    ALPRAZolam 0.5 MG tablet Commonly known as: Xanax   cyanocobalamin 1000 MCG tablet Commonly known as: VITAMIN B12   diclofenac sodium 1 % Gel Commonly known as: VOLTAREN   metoprolol succinate 25 MG 24 hr tablet Commonly known as: TOPROL-XL   MULTIPLE VITAMINS PO   Myrbetriq 50 MG Tb24 tablet Generic drug: mirabegron ER   nitrofurantoin (macrocrystal-monohydrate) 100 MG capsule Commonly known as: MACROBID   rifaximin 550 MG Tabs tablet Commonly known as: XIFAXAN       TAKE these medications    acetaminophen 500 MG tablet Commonly known as: TYLENOL Take 1 tablet (500 mg total) by mouth every 6 (six) hours as needed. What changed: how much to take   aspirin EC 81 MG tablet Take 1 tablet by mouth daily.   feeding supplement Liqd Take 237 mLs by mouth 2 (two) times daily between meals.   furosemide 20 MG tablet Commonly known as: LASIX Take 40 mg by mouth in the morning.   Lactulose 20 GM/30ML Soln Take 30 mLs (20 g total) by mouth daily. Take one dose every other day What changed:  how much to take how to take this when to take this   levofloxacin 750 MG tablet Commonly known as: LEVAQUIN Take 1 tablet (750 mg total) by mouth every other day for 2 doses.   levothyroxine 100 MCG tablet Commonly known as: SYNTHROID Take 1 tablet (100 mcg total) by mouth daily.   lisinopril 5 MG tablet Commonly known as:  ZESTRIL Take 5 mg by mouth daily.   memantine 5 MG tablet Commonly known as: NAMENDA Take 5 mg by mouth 2 (two) times daily.   nadolol 20 MG tablet Commonly known as: CORGARD Take 20 mg by mouth every evening.   oxyCODONE 5 MG immediate release tablet Commonly known as: Oxy IR/ROXICODONE oxycodone 5 mg tablet   PARoxetine 40 MG tablet Commonly known as: PAXIL Take 40 mg by mouth daily. What changed: Another medication with the same name was removed. Continue taking this medication, and follow the directions you see here.   polyethylene glycol 17 g packet Commonly known as: MIRALAX / GLYCOLAX Take 17 g by mouth daily as needed for moderate constipation. Self admin may have in room   QUEtiapine 25 MG tablet Commonly known as: SEROQUEL Take 25 mg by mouth at bedtime.   Vitamin D-1000 Max  St 25 MCG (1000 UT) tablet Generic drug: Cholecalciferol Take 1,000 Units by mouth daily.        Follow-up Information     Harlon Flor, [employer], PA-C. Schedule an appointment as soon as possible for a visit in 1 week(s).   Specialty: Family Medicine Contact information: [address] [place] [address] [phone]                Discharge Exam: Filed Weights   08/12/23 0500 08/13/23 0336 08/14/23 0407  Weight: 91.1 kg 91.1 kg 90.7 kg   General.  Well-developed elderly lady, in no acute distress. Pulmonary.  Lungs clear bilaterally, normal respiratory effort. CV.  Regular rate and rhythm, no JVD, rub or murmur. Abdomen.  Soft, nontender, nondistended, BS positive. CNS.  Alert and oriented .  No focal neurologic deficit. Extremities.  No edema, no cyanosis, pulses intact and symmetrical.  Condition at discharge: good  The results of significant diagnostics from this hospitalization (including imaging, microbiology, ancillary and laboratory) are listed below for reference.   Imaging Studies: CT ABDOMEN PELVIS W CONTRAST Result Date: 08/08/2023 CLINICAL  DATA:  Altered mental status, UTI symptoms, vomiting EXAM: CT ABDOMEN AND PELVIS WITH CONTRAST TECHNIQUE: Multidetector CT imaging of the abdomen and pelvis was performed using the standard protocol following bolus administration of intravenous contrast. RADIATION DOSE REDUCTION: This exam was performed according to the departmental dose-optimization program which includes automated exposure control, adjustment of the mA and/or kV according to patient size and/or use of iterative reconstruction technique. CONTRAST:  OMNIPAQUE IOHEXOL 300 MG/ML  SOLN COMPARISON:  None Available. FINDINGS: Lower chest: Trace bilateral pleural effusions. Mild bilateral lower lobe atelectasis. Hepatobiliary: Nodular hepatic contour, suggesting cirrhosis. No focal hepatic lesion is seen. Status post cholecystectomy. No intrahepatic or extrahepatic ductal dilatation. Pancreas: Mild peripancreatic stranding, including along the pancreatic tail (series 2/image 34), raising the possibility of mild acute pancreatitis. No pancreatic mass, atrophy, or drainable fluid collection/pseudocyst. Spleen: Mildly enlarged, measuring 15.2 cm in maximal craniocaudal dimension. Adrenals/Urinary Tract: Adrenal glands are within normal limits. Kidneys are within normal limits. Mild fullness of the bilateral renal collecting system without frank hydronephrosis. Thick-walled bladder, although underdistended. Stomach/Bowel: Stomach is notable for a small hiatal hernia. No evidence of bowel obstruction. Normal appendix (series 2/image 88). No colonic wall thickening or inflammatory changes. Moderate rectal stool burden, suggesting mild fecal impaction, without associated wall thickening to suggest stercoral colitis. Vascular/Lymphatic: No evidence of abdominal aortic aneurysm. Atherosclerotic calcifications of the abdominal aorta and branch vessels, although vessels remain patent. No suspicious abdominopelvic lymphadenopathy. Reproductive: Status post  hysterectomy. No adnexal masses. Other: No abdominopelvic ascites. Musculoskeletal: Degenerative changes of the visualized thoracolumbar spine. Moderate superior endplate compression fracture deformity at L2, likely chronic. IMPRESSION: Suspected mild acute pancreatitis. Thick-walled bladder, although underdistended. Correlate for cystitis. Cirrhosis with mild splenomegaly. Suspected mild rectal fecal impaction, without stercoral colitis. Trace bilateral pleural effusions. Electronically Signed   By: Charline Bills M.D.   On: 08/08/2023 21:39   CT Head Wo Contrast Result Date: 08/08/2023 CLINICAL DATA:  Altered mental status. EXAM: CT HEAD WITHOUT CONTRAST TECHNIQUE: Contiguous axial images were obtained from the base of the skull through the vertex without intravenous contrast. RADIATION DOSE REDUCTION: This exam was performed according to the departmental dose-optimization program which includes automated exposure control, adjustment of the mA and/or kV according to patient size and/or use of iterative reconstruction technique. COMPARISON:  None Available. FINDINGS: Brain: There is mild cerebral atrophy with widening of the extra-axial spaces  and ventricular dilatation. There are areas of decreased attenuation within the white matter tracts of the supratentorial brain, consistent with microvascular disease changes. Vascular: Moderate to marked severity bilateral cavernous carotid artery calcification is noted. Skull: Normal. Negative for fracture or focal lesion. Sinuses/Orbits: No acute finding. Other: None. IMPRESSION: 1. No acute intracranial abnormality. 2. Generalized cerebral atrophy with widening of the extra-axial spaces and ventricular dilatation. Electronically Signed   By: Aram Candela M.D.   On: 08/08/2023 19:07   DG Chest 1 View Result Date: 08/08/2023 CLINICAL DATA:  Altered mental status. EXAM: CHEST  1 VIEW COMPARISON:  April 30, 2021 FINDINGS: The cardiac silhouette is mildly  enlarged and unchanged in size. Mild, diffuse, chronic appearing increased interstitial lung markings are seen. Mild, stable areas of linear scarring and/or atelectasis are seen within the mid right lung and left lung base. No pleural effusion or pneumothorax is identified. Multilevel degenerative changes are seen throughout the thoracic spine. IMPRESSION: Chronic appearing increased interstitial lung markings with mild, stable areas of linear scarring and/or atelectasis within the mid right lung and left lung base. Electronically Signed   By: Aram Candela M.D.   On: 08/08/2023 19:06    Microbiology: Results for orders placed or performed during the hospital encounter of 08/08/23  Resp panel by RT-PCR (RSV, Flu A&B, Covid) Anterior Nasal Swab     Status: Abnormal   Collection Time: 08/08/23  5:44 PM   Specimen: Anterior Nasal Swab  Result Value Ref Range Status   SARS Coronavirus 2 by RT PCR POSITIVE (A) NEGATIVE Final    Comment: (NOTE) SARS-CoV-2 target nucleic acids are DETECTED.  The SARS-CoV-2 RNA is generally detectable in upper respiratory specimens during the acute phase of infection. Positive results are indicative of the presence of the identified virus, but do not rule out bacterial infection or co-infection with other pathogens not detected by the test. Clinical correlation with patient history and other diagnostic information is necessary to determine patient infection status. The expected result is Negative.  Fact Sheet for Patients: BloggerCourse.com  Fact Sheet for Healthcare Providers: SeriousBroker.it  This test is not yet approved or cleared by the [place] FDA and  has been authorized for detection and/or diagnosis of SARS-CoV-2 by FDA under an Emergency Use Authorization (EUA).  This EUA will remain in effect (meaning this test can be used) for the duration of  the COVID-19 declaration under Section  564(b)(1) of the A ct, 21 U.S.C. section 360bbb-3(b)(1), unless the authorization is terminated or revoked sooner.     Influenza A by PCR NEGATIVE NEGATIVE Final   Influenza B by PCR NEGATIVE NEGATIVE Final    Comment: (NOTE) The Xpert Xpress SARS-CoV-2/FLU/RSV plus assay is intended as an aid in the diagnosis of influenza from Nasopharyngeal swab specimens and should not be used as a sole basis for treatment. Nasal washings and aspirates are unacceptable for Xpert Xpress SARS-CoV-2/FLU/RSV testing.  Fact Sheet for Patients: BloggerCourse.com  Fact Sheet for Healthcare Providers: SeriousBroker.it  This test is not yet approved or cleared by the [place] FDA and has been authorized for detection and/or diagnosis of SARS-CoV-2 by FDA under an Emergency Use Authorization (EUA). This EUA will remain in effect (meaning this test can be used) for the duration of the COVID-19 declaration under Section 564(b)(1) of the Act, 21 U.S.C. section 360bbb-3(b)(1), unless the authorization is terminated or revoked.     Resp Syncytial Virus by PCR NEGATIVE NEGATIVE Final    Comment: (NOTE) Fact Sheet  for Patients: BloggerCourse.com  Fact Sheet for Healthcare Providers: SeriousBroker.it  This test is not yet approved or cleared by the [place] FDA and has been authorized for detection and/or diagnosis of SARS-CoV-2 by FDA under an Emergency Use Authorization (EUA). This EUA will remain in effect (meaning this test can be used) for the duration of the COVID-19 declaration under Section 564(b)(1) of the Act, 21 U.S.C. section 360bbb-3(b)(1), unless the authorization is terminated or revoked.  Performed at [facility], [address] [place], [place] [address]   Culture, blood (routine x 2)     Status: Abnormal   Collection Time: 08/08/23  6:03 PM   Specimen: BLOOD   Result Value Ref Range Status   Specimen Description   Final    BLOOD BLOOD RIGHT FOREARM Performed at [facility], [address], [place], [place] [address]    Special Requests   Final    BOTTLES DRAWN AEROBIC AND ANAEROBIC Blood Culture results may not be optimal due to an inadequate volume of blood received in culture bottles Performed at [facility], [address] [place], [place] [address]    Culture  Setup Time   Final    GRAM NEGATIVE RODS IN BOTH AEROBIC AND ANAEROBIC BOTTLES CRITICAL RESULT CALLED TO, READ BACK BY AND VERIFIED WITH: RAQUEL RODRIGUEZ GUZMAN AT 0710 08/09/23.PMF Performed at [facility], [address], [place], [place] [address]    Culture ESCHERICHIA COLI (A)  Final   Report Status 08/11/2023 FINAL  Final   Organism ID, Bacteria ESCHERICHIA COLI  Final   Organism ID, Bacteria ESCHERICHIA COLI  Final      Susceptibility   Escherichia coli - KIRBY BAUER*    CEFAZOLIN INTERMEDIATE Intermediate    Escherichia coli - MIC*    AMPICILLIN >=32 RESISTANT Resistant     CEFEPIME <=0.12 SENSITIVE Sensitive     CEFTAZIDIME <=1 SENSITIVE Sensitive     CEFTRIAXONE <=0.25 SENSITIVE Sensitive     CIPROFLOXACIN <=0.25 SENSITIVE Sensitive     GENTAMICIN <=1 SENSITIVE Sensitive     IMIPENEM <=0.25 SENSITIVE Sensitive     TRIMETH/SULFA >=320 RESISTANT Resistant     AMPICILLIN/SULBACTAM 16 INTERMEDIATE Intermediate     PIP/TAZO <=4 SENSITIVE Sensitive ug/mL    * ESCHERICHIA COLI    ESCHERICHIA COLI  Blood Culture ID Panel (Reflexed)     Status: Abnormal   Collection Time: 08/08/23  6:03 PM  Result Value Ref Range Status   Enterococcus faecalis NOT DETECTED NOT DETECTED Final   Enterococcus Faecium NOT DETECTED NOT DETECTED Final   Listeria monocytogenes NOT DETECTED NOT DETECTED Final   Staphylococcus species NOT DETECTED NOT DETECTED Final   Staphylococcus aureus (BCID) NOT DETECTED NOT DETECTED Final   Staphylococcus epidermidis NOT  DETECTED NOT DETECTED Final   Staphylococcus lugdunensis NOT DETECTED NOT DETECTED Final   Streptococcus species NOT DETECTED NOT DETECTED Final   Streptococcus agalactiae NOT DETECTED NOT DETECTED Final   Streptococcus pneumoniae NOT DETECTED NOT DETECTED Final   Streptococcus pyogenes NOT DETECTED NOT DETECTED Final   A.calcoaceticus-baumannii NOT DETECTED NOT DETECTED Final   Bacteroides fragilis NOT DETECTED NOT DETECTED Final   Enterobacterales DETECTED (A) NOT DETECTED Final    Comment: Enterobacterales represent a large order of gram negative bacteria, not a single organism. CRITICAL RESULT CALLED TO, READ BACK BY AND VERIFIED WITH: RAQUEL RODRIGUEZ GUZMAN AT 0710 08/09/23.PMF    Enterobacter cloacae complex NOT DETECTED NOT DETECTED Final   Escherichia coli DETECTED (A)  NOT DETECTED Final    Comment: CRITICAL RESULT CALLED TO, READ BACK BY AND VERIFIED WITH: RAQUEL RODRIGUEZ GUZMAN AT 0710 08/09/23.PMF    Klebsiella aerogenes NOT DETECTED NOT DETECTED Final   Klebsiella oxytoca NOT DETECTED NOT DETECTED Final   Klebsiella pneumoniae NOT DETECTED NOT DETECTED Final   Proteus species NOT DETECTED NOT DETECTED Final   Salmonella species NOT DETECTED NOT DETECTED Final   Serratia marcescens NOT DETECTED NOT DETECTED Final   Haemophilus influenzae NOT DETECTED NOT DETECTED Final   Neisseria meningitidis NOT DETECTED NOT DETECTED Final   Pseudomonas aeruginosa NOT DETECTED NOT DETECTED Final   Stenotrophomonas maltophilia NOT DETECTED NOT DETECTED Final   Candida albicans NOT DETECTED NOT DETECTED Final   Candida auris NOT DETECTED NOT DETECTED Final   Candida glabrata NOT DETECTED NOT DETECTED Final   Candida krusei NOT DETECTED NOT DETECTED Final   Candida parapsilosis NOT DETECTED NOT DETECTED Final   Candida tropicalis NOT DETECTED NOT DETECTED Final   Cryptococcus neoformans/gattii NOT DETECTED NOT DETECTED Final   CTX-M ESBL NOT DETECTED NOT DETECTED Final   Carbapenem  resistance IMP NOT DETECTED NOT DETECTED Final   Carbapenem resistance KPC NOT DETECTED NOT DETECTED Final   Carbapenem resistance NDM NOT DETECTED NOT DETECTED Final   Carbapenem resist OXA 48 LIKE NOT DETECTED NOT DETECTED Final   Carbapenem resistance VIM NOT DETECTED NOT DETECTED Final    Comment: Performed at [facility], [address] [place], [place] [address]  Culture, blood (routine x 2)     Status: Abnormal   Collection Time: 08/08/23  8:25 PM   Specimen: BLOOD  Result Value Ref Range Status   Specimen Description BLOOD BLOOD LEFT HAND  Final   Special Requests   Final    BOTTLES DRAWN AEROBIC AND ANAEROBIC Blood Culture adequate volume   Culture  Setup Time   Final    GRAM NEGATIVE RODS IN BOTH AEROBIC AND ANAEROBIC BOTTLES CRITICAL RESULT CALLED TO, READ BACK BY AND VERIFIED WITH: RAQUEL RODRIGUEZ GUZMAN AT 0710 08/09/23.PMF    Culture (A)  Final    ESCHERICHIA COLI SUSCEPTIBILITIES PERFORMED ON PREVIOUS CULTURE WITHIN THE LAST 5 DAYS.    Report Status 08/11/2023 FINAL  Final    Labs: CBC: Recent Labs  Lab 08/08/23 1744 08/09/23 0554 08/10/23 0639 08/11/23 0333 08/15/23 0504  WBC 5.8 8.0 7.0 4.9 6.1  NEUTROABS 5.3  --  5.5  --   --   HGB 10.1* 8.4* 8.4* 9.1* 9.9*  HCT 31.7* 26.9* 27.2* 28.5* 30.6*  MCV 87.8 90.9 91.0 89.6 87.9  PLT 137* 93* 105* 117* 161   Basic Metabolic Panel: Recent Labs  Lab 08/08/23 1744 08/09/23 0554 08/10/23 0639 08/11/23 0333 08/12/23 0425 08/13/23 0514 08/15/23 0504  NA 139   < > 138 142 140 138 136  K 3.2*   < > 3.3* 4.0 3.6 3.6 3.5  CL 103   < > 105 108 106 103 103  CO2 19*   < > 25 25 25 26 23   GLUCOSE 111*   < > 99 85 92 99 114*  BUN 14   < > 35* 35* 25* 21 21  CREATININE 1.10*   < > 1.22* 1.17* 1.02* 1.10* 1.29*  CALCIUM 8.7*   < > 8.2* 8.4* 8.4* 8.4* 8.5*  MG 1.8  --  2.6* 2.3 2.3 2.3  --   PHOS 1.3*  --  3.4 2.6 3.4 3.6  --    < > = values  in this interval not displayed.   Liver Function  Tests: Recent Labs  Lab 08/08/23 1744 08/09/23 0554 08/12/23 0425  AST 40 36  --   ALT 18 16  --   ALKPHOS 78 35*  --   BILITOT 3.1* 1.9*  --   PROT 7.1 5.9*  --   ALBUMIN 3.8 3.0* 3.0*   CBG: Recent Labs  Lab 08/10/23 0701 08/10/23 0744 08/10/23 1155 08/10/23 1308  GLUCAP 98 88 78 78    Discharge time spent: greater than 30 minutes.  Signed: Arnetha Courser, MD [facility] 08/15/2023

## 2024-06-22 DEATH — deceased
